# Patient Record
Sex: Male | Born: 1945 | Race: White | Hispanic: No | Marital: Married | Smoking: Former smoker
Health system: Southern US, Community
[De-identification: ages and names within clinical notes are randomized; demographics above are authoritative.]

## PROBLEM LIST (undated history)

## (undated) ENCOUNTER — Ambulatory Visit (HOSPITAL_COMMUNITY): Disposition: A | Discharge status: Other Acute Inpatient Hospital | Payer: Medicare Other

## (undated) DIAGNOSIS — E785 Hyperlipidemia, unspecified: Secondary | ICD-10-CM

## (undated) DIAGNOSIS — I739 Peripheral vascular disease, unspecified: Secondary | ICD-10-CM

## (undated) DIAGNOSIS — I10 Essential (primary) hypertension: Secondary | ICD-10-CM

## (undated) DIAGNOSIS — C801 Malignant (primary) neoplasm, unspecified: Secondary | ICD-10-CM

## (undated) DIAGNOSIS — I6529 Occlusion and stenosis of unspecified carotid artery: Secondary | ICD-10-CM

## (undated) DIAGNOSIS — I2510 Atherosclerotic heart disease of native coronary artery without angina pectoris: Secondary | ICD-10-CM

## (undated) DIAGNOSIS — T7840XA Allergy, unspecified, initial encounter: Secondary | ICD-10-CM

## (undated) HISTORY — DX: Malignant (primary) neoplasm, unspecified: C80.1

## (undated) HISTORY — DX: Allergy, unspecified, initial encounter: T78.40XA

## (undated) HISTORY — PX: ESOPHAGOGASTRODUODENOSCOPY: SHX1529

## (undated) HISTORY — DX: Occlusion and stenosis of unspecified carotid artery: I65.29

## (undated) HISTORY — PX: COLONOSCOPY: SHX174

---

## 2009-07-31 HISTORY — PX: ESOPHAGOSCOPY W/ PERCUTANEOUS GASTROSTOMY TUBE PLACEMENT: SUR463

## 2013-11-25 DIAGNOSIS — D126 Benign neoplasm of colon, unspecified: Secondary | ICD-10-CM

## 2013-11-25 HISTORY — DX: Benign neoplasm of colon, unspecified: D12.6

## 2016-10-05 ENCOUNTER — Ambulatory Visit (INDEPENDENT_AMBULATORY_CARE_PROVIDER_SITE_OTHER): Payer: Medicare Other | Admitting: Family Medicine

## 2016-10-05 ENCOUNTER — Encounter: Payer: Self-pay | Admitting: Family Medicine

## 2016-10-05 ENCOUNTER — Ambulatory Visit
Admission: RE | Admit: 2016-10-05 | Discharge: 2016-10-05 | Disposition: A | Discharge status: Home / Self Care | Payer: Medicare Other | Source: Ambulatory Visit | Attending: Family Medicine | Admitting: Family Medicine

## 2016-10-05 DIAGNOSIS — Z85819 Personal history of malignant neoplasm of unspecified site of lip, oral cavity, and pharynx: Secondary | ICD-10-CM | POA: Diagnosis not present

## 2016-10-05 DIAGNOSIS — R739 Hyperglycemia, unspecified: Secondary | ICD-10-CM

## 2016-10-05 DIAGNOSIS — M5136 Other intervertebral disc degeneration, lumbar region: Secondary | ICD-10-CM | POA: Diagnosis not present

## 2016-10-05 DIAGNOSIS — I25119 Atherosclerotic heart disease of native coronary artery with unspecified angina pectoris: Secondary | ICD-10-CM | POA: Insufficient documentation

## 2016-10-05 DIAGNOSIS — M5417 Radiculopathy, lumbosacral region: Secondary | ICD-10-CM

## 2016-10-05 DIAGNOSIS — I25118 Atherosclerotic heart disease of native coronary artery with other forms of angina pectoris: Secondary | ICD-10-CM

## 2016-10-05 DIAGNOSIS — K219 Gastro-esophageal reflux disease without esophagitis: Secondary | ICD-10-CM | POA: Diagnosis not present

## 2016-10-05 DIAGNOSIS — E7800 Pure hypercholesterolemia, unspecified: Secondary | ICD-10-CM | POA: Insufficient documentation

## 2016-10-05 DIAGNOSIS — M5416 Radiculopathy, lumbar region: Secondary | ICD-10-CM | POA: Insufficient documentation

## 2016-10-05 LAB — POCT GLYCOSYLATED HEMOGLOBIN (HGB A1C): HEMOGLOBIN A1C: 5.8

## 2016-10-05 MED ORDER — LOVASTATIN 20 MG PO TABS: 20.0000 mg | ORAL_TABLET | Freq: Every day | ORAL | 3 refills | Status: DC

## 2016-10-05 MED ORDER — GABAPENTIN 100 MG PO CAPS: 100.0000 mg | ORAL_CAPSULE | Freq: Three times a day (TID) | ORAL | 3 refills | Status: DC

## 2016-10-05 MED ORDER — LISINOPRIL 5 MG PO TABS: 5.0000 mg | ORAL_TABLET | Freq: Every day | ORAL | 3 refills | Status: DC

## 2016-10-05 NOTE — Assessment & Plan Note (Signed)
A1C is in non diabetic range.  No follow up needed.

## 2016-10-05 NOTE — Progress Notes (Signed)
   Subjective:    Patient ID: Shane Shelton, male    DOB: 1945-12-27, 71 y.o.   MRN: [id number]  HPI Per intake form HPDP: Colonoscopy 2012, PSA 2017, tetanus 2017, pneumonia vaccine 2017.  Had "complete physical 05/2016 prior to moving to [place].  71 yo male here to establish care.  Issues 1. States he is up to date on HPDP/ 2. 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  Has stable angina - can bring on if he runs three miles.  I am a little unclear why ACE.  Never told CHF, HBP or DM (he thinks he is on due to CAD.) 3. Known GERD on PRN protonix. Controled. 4. 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  Now disease free. 5. Ex smoker.  See history section for complete hx. 6. Hx of hyperglycemia fasting last lab.  Told needs follow up to check for DM.  Does have family hx.   7. Tingling of lateral aspect of left thigh.  Dysasthesia not anesthesia.  No muscle weakness.  Hx of some low back pain after a bad fall.      Review of Systems Stable CP as above.  NO sOB, HA, wt change, bleeding, skin concerns or focal neuro deficits (except thigh tingling.)  Denies changes in bowel, bladder or appetite.     Objective:   Physical Exam  HEENT, WNL Neck no sig nodes Lungs clear Cardiac RRR without m or g Abd benign, no organomegally or tenderness Back, good ROM Ext a bit of hyperesthesia over lateral aspect of left thigh.  Otherwise normal strength and sensation. Neuro WNL        Assessment & Plan:

## 2016-10-05 NOTE — Patient Instructions (Signed)
I will call with lab and x ray results. You should get your prescriptions from mail order. See me in a couple of months - time to get records from [place].

## 2016-10-05 NOTE — Assessment & Plan Note (Signed)
No change, await records.

## 2016-10-05 NOTE — Assessment & Plan Note (Signed)
I am pretty certain that the thigh discomfort is neuropathic.  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  Will start with plain films.  Also, treatment with gabapentin which should treat neuropathic pain regardless of etiology.

## 2016-10-05 NOTE — Assessment & Plan Note (Signed)
Stable on protonix

## 2016-10-05 NOTE — Assessment & Plan Note (Signed)
Will eventually refer to ENT for survellence.  Wait till records.

## 2016-10-05 NOTE — Assessment & Plan Note (Signed)
No change, await records.  Intend cards referral post records.

## 2016-12-14 ENCOUNTER — Encounter: Payer: Self-pay | Admitting: Family Medicine

## 2016-12-14 ENCOUNTER — Ambulatory Visit (INDEPENDENT_AMBULATORY_CARE_PROVIDER_SITE_OTHER): Payer: Medicare Other | Admitting: Family Medicine

## 2016-12-14 DIAGNOSIS — G629 Polyneuropathy, unspecified: Secondary | ICD-10-CM | POA: Diagnosis not present

## 2016-12-14 DIAGNOSIS — Z85828 Personal history of other malignant neoplasm of skin: Secondary | ICD-10-CM | POA: Diagnosis not present

## 2016-12-14 DIAGNOSIS — I25118 Atherosclerotic heart disease of native coronary artery with other forms of angina pectoris: Secondary | ICD-10-CM

## 2016-12-14 DIAGNOSIS — M25571 Pain in right ankle and joints of right foot: Secondary | ICD-10-CM | POA: Diagnosis not present

## 2016-12-14 DIAGNOSIS — R202 Paresthesia of skin: Secondary | ICD-10-CM | POA: Diagnosis not present

## 2016-12-14 DIAGNOSIS — E7800 Pure hypercholesterolemia, unspecified: Secondary | ICD-10-CM | POA: Diagnosis not present

## 2016-12-14 MED ORDER — ISOSORBIDE MONONITRATE ER 30 MG PO TB24: 30.0000 mg | ORAL_TABLET | Freq: Every day | ORAL | 6 refills | Status: DC

## 2016-12-14 MED ORDER — MELOXICAM 7.5 MG PO TABS: 7.5000 mg | ORAL_TABLET | Freq: Every day | ORAL | 0 refills | Status: DC

## 2016-12-14 NOTE — Assessment & Plan Note (Addendum)
Needs derm referral

## 2016-12-14 NOTE — Patient Instructions (Signed)
For your ankle, I sent in meloxicam - a once a day type NSAID.  Use for two weeks to treat acute inflammation. For your numbness - I am checking several blood tests.  I will call with results.  We may need to do more, especially if it worsens.  The more could be a closer look at your leg circulation or it could be an MRI of your spine. For your angina, I put in a cardiology referral.  Also, we will try the long acting nitroglycerin pill to take every morning.

## 2016-12-14 NOTE — Assessment & Plan Note (Signed)
Check for various neuropathies

## 2016-12-14 NOTE — Assessment & Plan Note (Addendum)
I am hesitant to label him as a radiculopathy/compression neuropathy.  Will check for other causes of neuropathy. Worsening with exercise suggests either vascular or neurogenic claudication.  Consider ABI, consider MRI.  Labs do not reveil any metabolic cause of neuropathy.  Discussed with patient.  Will refer to Dr. Valentina Lucks for ABIs

## 2016-12-14 NOTE — Assessment & Plan Note (Signed)
Again pattern stable.  Add imdur and get cards referral.  May need beta blocker.  Does not have compelling reason for ACE.  Watch low blood pressure

## 2016-12-14 NOTE — Assessment & Plan Note (Addendum)
I am concerned with his angina and aortic calcifications that we are inadaquate on his statin dose.  Check direct LDL.  Called patient with markedly high LDL.  He is aghast and puzzled.  He believes that at his last physical in Sept 2017, his total cholesterol was in the 180's and his LDL was in the 60's.  Obviously we need those records and he is vigorously pursuing with his [place] doctors.    Referring to Dr. Valentina Lucks for ABIs.  He can also comment on statin/cholesterol management.

## 2016-12-14 NOTE — Assessment & Plan Note (Signed)
Most likely an acute tendonitis.  Add NSAID to [facility] Rx  Consider SM referral if does not resolve.

## 2016-12-14 NOTE — Progress Notes (Signed)
   Subjective:    Patient ID: Shane Shelton, male    DOB: Dec 15, 1945, 71 y.o.   MRN: [id number]  HPI Multiple issues: 1. CAD with stable angina.  He is a bit frustrated in that he would like to be more active.  On ASA and statin (low potency.) Have not yet received records from docs in [place].  Unclear why not on Beta blocker or nitrate.   2. Had dysasthesias and parasthesia of right thigh.  Neurontin did not help  Again, no outside records.  Now having left leg sx which are actually worse than his right leg problems.  Does give hx of symptoms worsening with activity.  Reviewed LS spine films.  DDD and facet arthropathy mild.  Note significant aortic calcifications. 3. After yoga, had pain in right ankle, posterior to the lateral malleolus.  No trauma or sprain that he knows of.  Has been doing rice.  Injury was two weeks ago. 4. Wants derm referral.  Hx of skin cancer.         Review of Systems     Objective:   Physical Exam note lowish BP Cardiac RRR without m or g Lungs clear Abd benign Ext, numbness not bothering him now.  Normal exam.  Normal DTRs Right ankle pain just post to lateral malleolus.  Achilles tendon OK.  No heal pain.  No bone pain over malleolus. Good pulses both feet.        Assessment & Plan:

## 2016-12-15 LAB — CMP14+EGFR
A/G RATIO: 2 (ref 1.2–2.2)
ALT: 16 IU/L (ref 0–44)
AST: 17 IU/L (ref 0–40)
Albumin: 4.5 g/dL (ref 3.6–4.8)
Alkaline Phosphatase: 44 IU/L (ref 39–117)
BUN/Creatinine Ratio: 21 (ref 10–24)
BUN: 20 mg/dL (ref 8–27)
Bilirubin Total: 0.3 mg/dL (ref 0.0–1.2)
CHLORIDE: 100 mmol/L (ref 96–106)
CO2: 23 mmol/L (ref 18–29)
CREATININE: 0.97 mg/dL (ref 0.76–1.27)
Calcium: 9.3 mg/dL (ref 8.6–10.2)
GFR calc Af Amer: 92 mL/min/{1.73_m2} (ref >59)
GFR calc non Af Amer: 79 mL/min/{1.73_m2} (ref >59)
GLOBULIN, TOTAL: 2.2 g/dL (ref 1.5–4.5)
Glucose: 97 mg/dL (ref 65–99)
POTASSIUM: 4.6 mmol/L (ref 3.5–5.2)
SODIUM: 139 mmol/L (ref 134–144)
Total Protein: 6.7 g/dL (ref 6.0–8.5)

## 2016-12-15 LAB — CBC
Hematocrit: 42.9 % (ref 37.5–51.0)
Hemoglobin: 14.3 g/dL (ref 13.0–17.7)
MCH: 29.9 pg (ref 26.6–33.0)
MCHC: 33.3 g/dL (ref 31.5–35.7)
MCV: 90 fL (ref 79–97)
PLATELETS: 198 10*3/uL (ref 150–379)
RBC: 4.78 x10E6/uL (ref 4.14–5.80)
RDW: 14.2 % (ref 12.3–15.4)
WBC: 4.4 10*3/uL (ref 3.4–10.8)

## 2016-12-15 LAB — LDL CHOLESTEROL, DIRECT: LDL Direct: 153 mg/dL — ABNORMAL HIGH (ref 0–99)

## 2016-12-15 LAB — TSH: TSH: 3.53 u[IU]/mL (ref 0.450–4.500)

## 2016-12-15 LAB — VITAMIN B12: VITAMIN B 12: 458 pg/mL (ref 232–1245)

## 2016-12-28 ENCOUNTER — Encounter: Payer: Self-pay | Admitting: Family Medicine

## 2016-12-28 ENCOUNTER — Encounter: Payer: Self-pay | Admitting: Pharmacist

## 2016-12-28 ENCOUNTER — Ambulatory Visit (INDEPENDENT_AMBULATORY_CARE_PROVIDER_SITE_OTHER): Payer: Medicare Other | Admitting: Pharmacist

## 2016-12-28 DIAGNOSIS — I25118 Atherosclerotic heart disease of native coronary artery with other forms of angina pectoris: Secondary | ICD-10-CM | POA: Diagnosis not present

## 2016-12-28 DIAGNOSIS — G629 Polyneuropathy, unspecified: Secondary | ICD-10-CM | POA: Diagnosis not present

## 2016-12-28 DIAGNOSIS — E7800 Pure hypercholesterolemia, unspecified: Secondary | ICD-10-CM

## 2016-12-28 DIAGNOSIS — R202 Paresthesia of skin: Secondary | ICD-10-CM

## 2016-12-28 MED ORDER — GABAPENTIN 300 MG PO CAPS: 300.0000 mg | ORAL_CAPSULE | Freq: Three times a day (TID) | ORAL | 3 refills | Status: DC

## 2016-12-28 MED ORDER — LOVASTATIN 40 MG PO TABS: 40.0000 mg | ORAL_TABLET | Freq: Every day | ORAL | 3 refills | Status: DC

## 2016-12-28 NOTE — Assessment & Plan Note (Signed)
increased dose of lovastatin to 40mg  daily. Will consider addition of ezetimibe in the future.

## 2016-12-28 NOTE — Progress Notes (Addendum)
    S:    Patient arrives ambulating with assistance in good spirits.    She presents to the clinic for PADABI evaluation.  Patient was referred on 12/14/16.  Patient was last seen by Primary Care Provider on 12/14/16.   Patient reports pain with walking.  Pain is described as tingling, numbness which occurs after 20 minutes of exercise. Denies pain starting while at rest or standing still. Denies pain worsens when walking up hill or in a hurry. Reports pain when walking at an ordinary pace on a level surface. Reports pain resolves on sitting after 5 minutes.  Pain is localized to lateral thighs and patient reports increased frequency of cramping all over. 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   O:  Lower extremity Physical Exam is normal.   Pulses intact; patient is well nourished and well appearing.   ABI overall = 1.31. Right Arm 110 mmHg    Left Arm 122 mmHg Right ankle posterior tibial 160 mmHg     dorsalis pedis 128 mmHg Left ankle posterior tibial 162 mmHg    dorsalis pedis 148 mmHg   A/P:  Normal ABI and low likelihood of PAD based on ABI of 1.31 in a patient with symptoms of leg pain (tingling, burning) when walking.  Educated patient that leg pain could be related to previous neuropathy. increased dose of gabapentin to 300mg  TID following discussion with Dr. Andria Frames.   For HLD: increased dose of lovastatin to 40mg  daily. Will consider addition of ezetimibe in the future.   Results reviewed and written information provided.   F/U Clinic Visit with Dr. Andria Frames at patient's discretion.  Total time in face-to-face counseling 40 minutes.  Patient seen with Jerrye Noble, PharmD Candidate, and Arrie Senate, PharmD PGY-1 Resident and Bennye Alm, PharmD, BCPS, PGY2 Resident.

## 2016-12-28 NOTE — Patient Instructions (Addendum)
We are going to increase your gabapentin to 300mg  up to three times daily. A new prescription has been sent to your pharmacy. This increased dose may make you more drowsy; if this occurs, we will consider a different medication.  We are also going to increase your Lovastatin to 40mg  daily, given your recent LDL reading. You can take 2 of the 20mg  that you have at home, and then a new prescription will be at your pharmacy.  At a future appointment, we will consider adding Zetia which will help decrease your LDL further.

## 2016-12-28 NOTE — Assessment & Plan Note (Signed)
Normal ABI and low likelihood of PAD based on ABI of 1.31 in a patient with symptoms of leg pain (tingling, burning) when walking.

## 2016-12-28 NOTE — Progress Notes (Signed)
Patient ID: Shane Shelton, male   DOB: [birth year], 71 y.o.   MRN: [id number] Reviewed: Agree with Dr. Graylin Shiver documentation and management.

## 2016-12-28 NOTE — Assessment & Plan Note (Addendum)
Educated patient that leg pain could be related to previous neuropathy. increased dose of gabapentin to 300mg  TID following discussion with Dr. Andria Frames.

## 2017-01-19 DIAGNOSIS — L821 Other seborrheic keratosis: Secondary | ICD-10-CM | POA: Diagnosis not present

## 2017-01-19 DIAGNOSIS — D2272 Melanocytic nevi of left lower limb, including hip: Secondary | ICD-10-CM | POA: Diagnosis not present

## 2017-01-19 DIAGNOSIS — D225 Melanocytic nevi of trunk: Secondary | ICD-10-CM | POA: Diagnosis not present

## 2017-01-19 DIAGNOSIS — Z85828 Personal history of other malignant neoplasm of skin: Secondary | ICD-10-CM | POA: Diagnosis not present

## 2017-01-19 DIAGNOSIS — D2271 Melanocytic nevi of right lower limb, including hip: Secondary | ICD-10-CM | POA: Diagnosis not present

## 2017-01-19 DIAGNOSIS — D485 Neoplasm of uncertain behavior of skin: Secondary | ICD-10-CM | POA: Diagnosis not present

## 2017-01-30 ENCOUNTER — Encounter: Payer: Self-pay | Admitting: *Deleted

## 2017-02-05 ENCOUNTER — Other Ambulatory Visit: Payer: Self-pay | Admitting: *Deleted

## 2017-02-05 MED ORDER — PANTOPRAZOLE SODIUM 40 MG PO TBEC: 40.0000 mg | DELAYED_RELEASE_TABLET | Freq: Every day | ORAL | 3 refills | Status: DC | PRN

## 2017-02-12 ENCOUNTER — Encounter: Payer: Self-pay | Admitting: Family Medicine

## 2017-02-12 DIAGNOSIS — Z86010 Personal history of colonic polyps: Secondary | ICD-10-CM | POA: Insufficient documentation

## 2017-02-12 DIAGNOSIS — R918 Other nonspecific abnormal finding of lung field: Secondary | ICD-10-CM | POA: Insufficient documentation

## 2017-02-12 DIAGNOSIS — D126 Benign neoplasm of colon, unspecified: Secondary | ICD-10-CM

## 2017-02-12 DIAGNOSIS — I25118 Atherosclerotic heart disease of native coronary artery with other forms of angina pectoris: Secondary | ICD-10-CM

## 2017-02-12 DIAGNOSIS — D122 Benign neoplasm of ascending colon: Secondary | ICD-10-CM

## 2017-02-12 NOTE — Progress Notes (Signed)
Scanned results of 07/12/15 Echocardiogram. Mild LVH ETT 1 mm downsloping  sT depression inferiorly Holter 24 hour 271 PVCs, no couplets Scan colonoscopy report of 11/25/13 which showed tubular adenoma Scanned lasted EKG for comparison Had pneumovax 05/16/16 and Prevnar 09/08/13 and Tdap3/19/13.  Will ask nurse to enter. 06/03/15 Had ultrasound AAA screen, no aneurysm.  Scan to chart. CT lung screening12/12/16 - Findings 1. Emphysema, 2. Symmetric biapical scarring - stable. 3. Stable pulm nodules.  Rec Cont annual screening.  Entered colon polyp and pulmonary nodules into problem list with overview.  Updated CAD to include latest information.    Still needs hep C screen.

## 2017-02-13 ENCOUNTER — Encounter: Payer: Self-pay | Admitting: *Deleted

## 2017-02-14 ENCOUNTER — Telehealth (HOSPITAL_COMMUNITY): Payer: Self-pay | Admitting: *Deleted

## 2017-02-14 ENCOUNTER — Encounter: Payer: Self-pay | Admitting: Interventional Cardiology

## 2017-02-14 ENCOUNTER — Ambulatory Visit (INDEPENDENT_AMBULATORY_CARE_PROVIDER_SITE_OTHER): Payer: Medicare Other | Admitting: Interventional Cardiology

## 2017-02-14 VITALS — BP 120/78 | HR 63 | Ht 73.0 in | Wt 215.1 lb

## 2017-02-14 DIAGNOSIS — E7800 Pure hypercholesterolemia, unspecified: Secondary | ICD-10-CM

## 2017-02-14 DIAGNOSIS — I739 Peripheral vascular disease, unspecified: Secondary | ICD-10-CM

## 2017-02-14 DIAGNOSIS — I493 Ventricular premature depolarization: Secondary | ICD-10-CM | POA: Insufficient documentation

## 2017-02-14 DIAGNOSIS — I25118 Atherosclerotic heart disease of native coronary artery with other forms of angina pectoris: Secondary | ICD-10-CM | POA: Diagnosis not present

## 2017-02-14 DIAGNOSIS — I517 Cardiomegaly: Secondary | ICD-10-CM | POA: Insufficient documentation

## 2017-02-14 NOTE — Patient Instructions (Signed)
Medication Instructions:  None  Labwork: None  Testing/Procedures: Your physician has requested that you have en exercise stress myoview. For further information please visit [email or web address]. Please follow instruction sheet, as given.  Your physician has requested that you have a lower extremity arterial exercise duplex. During this test, exercise and ultrasound are used to evaluate arterial blood flow in the legs. Allow one hour for this exam. There are no restrictions or special instructions.  Your physician has requested that you have a lower or upper extremity arterial duplex. This test is an ultrasound of the arteries in the legs or arms. It looks at arterial blood flow in the legs and arms. Allow one hour for Lower and Upper Arterial scans. There are no restrictions or special instructions   Follow-Up: Your physician wants you to follow-up in: 1 year with Dr. Tamala Julian.  You will receive a reminder letter in the mail two months in advance. If you don't receive a letter, please call our office to schedule the follow-up appointment.   Any Other Special Instructions Will Be Listed Below (If Applicable).     If you need a refill on your cardiac medications before your next appointment, please call your pharmacy.

## 2017-02-14 NOTE — Telephone Encounter (Signed)
Patient given detailed instructions per Myocardial Perfusion Study Information Sheet for the test on  02/19/17. Patient notified to arrive 15 minutes early and that it is imperative to arrive on time for appointment to keep from having the test rescheduled.  If you need to cancel or reschedule your appointment, please call the office within 24 hours of your appointment. . Patient verbalized understanding. Kirstie Peri

## 2017-02-14 NOTE — Progress Notes (Signed)
Cardiology Office Note    Date:  02/14/2017   ID:  Shane Shelton, Shane Shelton Sep 29, 1945, MRN [id number]  PCP:  Shane Resides, MD  Cardiologist: Shane Grooms, MD   Chief Complaint  Patient presents with  . Coronary Artery Disease  . Advice Only    Exertional leg pain    History of Present Illness:  Shane Shelton is a 71 y.o. male referred by Shane Shelton for consultation concerning coronary artery disease and to establish for longitudinal follow-up.  The patient is 24 and has a history of nonobstructive coronary disease documented by cardiac catheterization 2016. After the catheterization lisinopril was added to his medical regimen. There was no improvement in his symptoms at that time which included exertional dyspnea and chest pressure. Over the past 2 years though symptoms have gotten progressively worse and are beginning to interfere in his lifestyle. He exercises on a regular basis and has noted progressive reduction in exertional tolerance due to chest pain and dyspnea.  Additionally, he has a progressive 4 month history of left thigh and buttocks greater than right numbness and pain with exertion. The discomfort resolves with rest. The leg complaint is worse than the chest complaint with reference to limitations in quality of life.  Past Medical History:  Diagnosis Date  . Cancer [facility])    Throat cancer 2011    No past surgical history on file.  Current Medications: Outpatient Medications Prior to Visit  Medication Sig Dispense Refill  . aspirin EC 81 MG tablet Take 81 mg by mouth daily.    . Coenzyme Q-10 200 MG CAPS Take 200 mg by mouth daily.    . isosorbide mononitrate (IMDUR) 30 MG 24 hr tablet Take 1 tablet (30 mg total) by mouth daily. 30 tablet 6  . lisinopril (PRINIVIL,ZESTRIL) 5 MG tablet Take 1 tablet (5 mg total) by mouth daily. 90 tablet 3  . lovastatin (MEVACOR) 40 MG tablet Take 1 tablet (40 mg total) by mouth at bedtime. 90  tablet 3  . gabapentin (NEURONTIN) 300 MG capsule Take 1 capsule (300 mg total) by mouth 3 (three) times daily. (Patient not taking: Reported on 02/14/2017) 270 capsule 3  . meloxicam (MOBIC) 7.5 MG tablet Take 1 tablet (7.5 mg total) by mouth daily. (Patient not taking: Reported on 02/14/2017) 30 tablet 0  . pantoprazole (PROTONIX) 40 MG tablet Take 1 tablet (40 mg total) by mouth daily as needed. (Patient taking differently: Take 40 mg by mouth daily as needed (GERD). ) 90 tablet 3   No facility-administered medications prior to visit.      Allergies:   Statins   Social History   Social History  . Marital status: Married    Spouse name: N/A  . Number of children: N/A  . Years of education: N/A   Social History Main Topics  . Smoking status: Former Smoker    Packs/day: 1.00    Years: 40.00    Types: Cigarettes    Quit date: 10/05/2009  . Smokeless tobacco: Never Used  . Alcohol use 4.2 oz/week    7 Glasses of wine per week  . Drug use: No  . Sexual activity: Yes     Comment: monagamous   Other Topics Concern  . None   Social History Narrative  . None     Family History:  The patient's family history includes Cancer in his brother and father; Diabetes in his mother; Heart disease in his brother and mother;  Hyperlipidemia in his brother and mother.   ROS:   Please see the history of present illness.    History of throat cancer secondary to cigarette smoking. Status post radiation and chemotherapy in 2011. Retired, otherwise very active, and planning a trip to [place] this year.  All other systems reviewed and are negative.   PHYSICAL EXAM:   VS:  BP 120/78 (BP Location: Right Arm)   Pulse 63   Ht 6\' 1"  (1.854 m)   Wt 215 lb 1.9 oz (97.6 kg)   BMI 28.38 kg/m    GEN: Well nourished, well developed, in no acute distress  HEENT: normal  Neck: no JVD, carotid bruits, or masses Cardiac: RRR; no murmurs, rubs, or gallops,no edema. 2+ bilateral lower extremity and upper  extremity pulses. No bruits are heard.  Respiratory:  clear to auscultation bilaterally, normal work of breathing GI: soft, nontender, nondistended, + BS MS: no deformity or atrophy  Skin: warm and dry, no rash Neuro:  Alert and Oriented x 3, Strength and sensation are intact Psych: euthymic mood, full affect  Wt Readings from Last 3 Encounters:  02/14/17 215 lb 1.9 oz (97.6 kg)  12/28/16 218 lb 3.2 oz (99 kg)  12/14/16 216 lb 9.6 oz (98.2 kg)      Studies/Labs Reviewed:   EKG:  EKG  Normal sinus rhythm  Recent Labs: 12/14/2016: ALT 16; BUN 20; Creatinine, Ser 0.97; Hemoglobin 14.3; Platelets 198; Potassium 4.6; Sodium 139; TSH 3.530   Lipid Panel    Component Value Date/Time   LDLDIRECT 153 (H) 12/14/2016 1351    Additional studies/ records that were reviewed today include:  Summary of information from prior physicians: Summary performed by Dr. Andria Shelton Scanned results of 07/12/15 Echocardiogram. Mild LVH ETT 1 mm downsloping  sT depression inferiorly Holter 24 hour 271 PVCs, no couplets Scan colonoscopy report of 11/25/13 which showed tubular adenoma Scanned lasted EKG for comparison Had pneumovax 05/16/16 and Prevnar 09/08/13 and Tdap3/19/13.  Will ask nurse to enter. 06/03/15 Had ultrasound AAA screen, no aneurysm.  Scan to chart. CT lung screening12/12/16 - Findings 1. Emphysema, 2. Symmetric biapical scarring - stable. 3. Stable pulm nodules.  Rec Cont annual screening.  Entered colon polyp and pulmonary nodules into problem list with overview.  Updated CAD to include latest information.     ASSESSMENT:    1. Coronary artery disease of native artery of native heart with stable angina pectoris ([place])   2. Hypercholesterolemia   3. Left leg claudication (H[facility])      PLAN:  In order of problems listed above:  1. By description, coronary angiography in 2016 demonstrated non-obstructive coronary disease. Symptoms at that time were not manage and have slowly gotten worse  and begun to limit the patient's activity. Classification is class III angina pectoris. Plan stress Myoview to exclude high risk subset given relatively recent angiography demonstrating "nonobstructive disease". 2. Most recent LDL of record is 153. With known clinical background we need LDL near and preferably below 70. He needs a more intense statin. Apparently Mevacor was increased from 20-40 mg after this particular blood test. This will be managed by Dr. Andria Shelton. 3. Stress bilateral lower extremity Doppler and duplex study to exclude iliac/distal aortic obstructive disease causing claudication. The differential for the left greater than right leg numbness and pain with exertion is neurogenic related to spine disease.    Medication Adjustments/Labs and Tests Ordered: Current medicines are reviewed at length with the patient today.  Concerns regarding medicines are  outlined above.  Medication changes, Labs and Tests ordered today are listed in the Patient Instructions below. Patient Instructions  Medication Instructions:  None  Labwork: None  Testing/Procedures: Your physician has requested that you have en exercise stress myoview. For further information please visit [email or web address]. Please follow instruction sheet, as given.  Your physician has requested that you have a lower extremity arterial exercise duplex. During this test, exercise and ultrasound are used to evaluate arterial blood flow in the legs. Allow one hour for this exam. There are no restrictions or special instructions.  Your physician has requested that you have a lower or upper extremity arterial duplex. This test is an ultrasound of the arteries in the legs or arms. It looks at arterial blood flow in the legs and arms. Allow one hour for Lower and Upper Arterial scans. There are no restrictions or special instructions   Follow-Up: Your physician wants you to follow-up in: 1 year with Dr. Tamala Julian.  You will receive a  reminder letter in the mail two months in advance. If you don't receive a letter, please call our office to schedule the follow-up appointment.   Any Other Special Instructions Will Be Listed Below (If Applicable).     If you need a refill on your cardiac medications before your next appointment, please call your pharmacy.      Signed, Shane Grooms, MD  02/14/2017 2:05 PM    [facility] [address], [place], [place]  [address] Phone: [phone]; Fax: [phone]

## 2017-02-19 ENCOUNTER — Encounter: Payer: Self-pay | Admitting: Interventional Cardiology

## 2017-02-19 ENCOUNTER — Ambulatory Visit (HOSPITAL_COMMUNITY): Payer: Medicare Other | Attending: Cardiovascular Disease

## 2017-02-19 DIAGNOSIS — R9439 Abnormal result of other cardiovascular function study: Secondary | ICD-10-CM | POA: Insufficient documentation

## 2017-02-19 DIAGNOSIS — I25118 Atherosclerotic heart disease of native coronary artery with other forms of angina pectoris: Secondary | ICD-10-CM | POA: Diagnosis not present

## 2017-02-19 LAB — MYOCARDIAL PERFUSION IMAGING
CHL CUP NUCLEAR SSS: 3
CHL RATE OF PERCEIVED EXERTION: 19
CSEPED: 10 min
CSEPHR: 87 %
Estimated workload: 11.4 METS
Exercise duration (sec): 30 s
LV dias vol: 84 mL (ref 62–150)
LV sys vol: 25 mL
MPHR: 151 {beats}/min
Peak HR: 131 {beats}/min
RATE: 0.36
Rest HR: 43 {beats}/min
SDS: 0
SRS: 3
TID: 1

## 2017-02-19 MED ORDER — TECHNETIUM TC 99M TETROFOSMIN IV KIT
11.0000 | PACK | Freq: Once | INTRAVENOUS | Status: AC | PRN
  Administered 2017-02-19: 11 via INTRAVENOUS
  Filled 2017-02-19: qty 11

## 2017-02-19 MED ORDER — TECHNETIUM TC 99M TETROFOSMIN IV KIT
32.6000 | PACK | Freq: Once | INTRAVENOUS | Status: AC | PRN
  Administered 2017-02-19: 32.6 via INTRAVENOUS
  Filled 2017-02-19: qty 33

## 2017-02-21 ENCOUNTER — Other Ambulatory Visit: Payer: Self-pay | Admitting: Interventional Cardiology

## 2017-02-21 DIAGNOSIS — I739 Peripheral vascular disease, unspecified: Secondary | ICD-10-CM

## 2017-02-26 ENCOUNTER — Other Ambulatory Visit: Payer: Self-pay | Admitting: *Deleted

## 2017-02-26 DIAGNOSIS — I25118 Atherosclerotic heart disease of native coronary artery with other forms of angina pectoris: Secondary | ICD-10-CM

## 2017-02-26 MED ORDER — METOPROLOL SUCCINATE ER 25 MG PO TB24: 25.0000 mg | ORAL_TABLET | Freq: Every day | ORAL | 3 refills | Status: DC

## 2017-02-26 MED ORDER — ISOSORBIDE MONONITRATE ER 60 MG PO TB24: 60.0000 mg | ORAL_TABLET | Freq: Every day | ORAL | 3 refills | Status: DC

## 2017-02-26 MED ORDER — METOPROLOL SUCCINATE ER 25 MG PO TB24
25.0000 mg | ORAL_TABLET | Freq: Every day | ORAL | 1 refills | Status: DC
Start: 2017-02-26 — End: 2017-02-26

## 2017-02-27 ENCOUNTER — Ambulatory Visit (HOSPITAL_COMMUNITY)
Admission: RE | Admit: 2017-02-27 | Discharge: 2017-02-27 | Disposition: A | Discharge status: Home / Self Care | Payer: Medicare Other | Source: Ambulatory Visit | Attending: Cardiovascular Disease | Admitting: Cardiovascular Disease

## 2017-02-27 DIAGNOSIS — Z87891 Personal history of nicotine dependence: Secondary | ICD-10-CM | POA: Insufficient documentation

## 2017-02-27 DIAGNOSIS — I2510 Atherosclerotic heart disease of native coronary artery without angina pectoris: Secondary | ICD-10-CM | POA: Insufficient documentation

## 2017-02-27 DIAGNOSIS — I739 Peripheral vascular disease, unspecified: Secondary | ICD-10-CM

## 2017-03-08 NOTE — Progress Notes (Addendum)
Cardiology Office Note    Date:  03/09/2017   ID:  Shane, Shelton [birth year], MRN [id number]  PCP:  [name], MD  Cardiologist: Sinclair Grooms, MD   Chief Complaint  Patient presents with  . Coronary Artery Disease  . Leg Pain    History of Present Illness:  Shane Shelton is a 71 y.o. male with 2016 coronary angiogram demonstrating non obstructive disease, abnormal low risk nuclear 01/2017, hyperlipidemia, hypertension,  and bilateral LE claudication with negative doppler 01/2017.  Since medication adjustment, he has not done very much physical activity. He is not being limited by cardiac symptoms. No medication side effects. Still complaining of left greater than right hip and leg pain with standing and walking. It is not precipitated by exercise. He has not needed use any nitroglycerin.  We spent time discussing the findings on his nuclear study.  Past Medical History:  Diagnosis Date  . Cancer [facility])    Throat cancer 2011    No past surgical history on file.  Current Medications: Outpatient Medications Prior to Visit  Medication Sig Dispense Refill  . aspirin EC 81 MG tablet Take 81 mg by mouth daily.    . Coenzyme Q-10 200 MG CAPS Take 200 mg by mouth daily.    . isosorbide mononitrate (IMDUR) 60 MG 24 hr tablet Take 1 tablet (60 mg total) by mouth daily. 90 tablet 3  . lisinopril (PRINIVIL,ZESTRIL) 5 MG tablet Take 1 tablet (5 mg total) by mouth daily. 90 tablet 3  . lovastatin (MEVACOR) 40 MG tablet Take 1 tablet (40 mg total) by mouth at bedtime. 90 tablet 3  . metoprolol succinate (TOPROL-XL) 25 MG 24 hr tablet Take 1 tablet (25 mg total) by mouth daily. 90 tablet 3  . pantoprazole (PROTONIX) 40 MG tablet Take 40 mg by mouth daily as needed (GERD).     No facility-administered medications prior to visit.      Allergies:   Statins   Social History   Social History  . Marital status: Married    Spouse name: N/A  . Number of  children: N/A  . Years of education: N/A   Social History Main Topics  . Smoking status: Former Smoker    Packs/day: 1.00    Years: 40.00    Types: Cigarettes    Quit date: 10/05/2009  . Smokeless tobacco: Never Used  . Alcohol use 4.2 oz/week    7 Glasses of wine per week  . Drug use: No  . Sexual activity: Yes     Comment: monagamous   Other Topics Concern  . None   Social History Narrative  . None     Family History:  The patient's family history includes Cancer in his brother and father; Diabetes in his mother; Heart disease in his brother and mother; Hyperlipidemia in his brother and mother.   ROS:   Please see the history of present illness.    Left leg discomfort greater than right. The discomfort can be present even when not exercising such as standing washing dishes. It goes away when he sits down.  All other systems reviewed and are negative.   PHYSICAL EXAM:   VS:  BP 112/65   Pulse 60   Ht _0  (1.854 m)   Wt 216 lb 6.4 oz (98.2 kg)   BMI 28.55 kg/m    GEN: Well nourished, well developed, in no acute distress  HEENT: normal  Neck: no JVD, carotid  bruits, or masses Cardiac: RRR; no murmurs, rubs, or gallops,no edema  Respiratory:  clear to auscultation bilaterally, normal work of breathing GI: soft, nontender, nondistended, + BS MS: no deformity or atrophy  Skin: warm and dry, no rash Neuro:  Alert and Oriented x 3, Strength and sensation are intact Psych: euthymic mood, full affect  Wt Readings from Last 3 Encounters:  03/09/17 216 lb 6.4 oz (98.2 kg)  02/19/17 215 lb (97.5 kg)  02/14/17 215 lb 1.9 oz (97.6 kg)      Studies/Labs Reviewed:   EKG:  EKG  Not repeated  Recent Labs: 12/14/2016: ALT 16; BUN 20; Creatinine, Ser 0.97; Hemoglobin 14.3; Platelets 198; Potassium 4.6; Sodium 139; TSH 3.530   Lipid Panel    Component Value Date/Time   LDLDIRECT 153 (H) 12/14/2016 1351    Additional studies/ records that were reviewed today include:    Stress Myoview 01/2017: Study Highlights     Nuclear stress EF: 70%.  Blood pressure demonstrated a normal response to exercise.  No T wave inversion was noted during stress.  There was no ST segment deviation noted during stress.  Defect 1: There is a small defect of mild severity.  This is a low risk study.   Small size, mild intensity fixed inferior perfusion defect suspicious for artifact. No reversible ischemia. LVEF 70%. This is a low risk study.   LOWER EXTREMITY ULTRASOUND, VASCULAR: 02/27/17 Impressions No evidence of segmental lower extremity arterial disease at rest, bilaterally. Normal ABI's, bilaterally. Normal great toe-brachial indices, bilaterally.  Cardiac catheterization 07/15/2015: 50% stenosis of the proximal LAD; dominant right coronary with 40% stenosis in the mid vessel and 70% stenosis in the mid segment of a postero-lateral branch. Circumflex is small in caliber and widely patent left main is normal.  ASSESSMENT:    1. Coronary artery disease of native artery of native heart with stable angina pectoris ([place])   2. Premature ventricular contractions   3. Hypercholesterolemia   4. Left leg claudication (HCC)      PLAN:  In order of problems listed above:  1. Low risk myocardial perfusion study and nonobstructive coronary disease on cath within the past 18 months implying medical management of exertion related symptoms. If they become more limiting we will consider repeat angiography to rule out progression of disease. Cardiac cath performed at [facility] revealed a myriad postero-lateral branch of the right coronary with 75% stenosis.  2. Not addressed 3. Primary care increase the intensity of the patient's Mevacor. A repeat lipid panel has not been performed. LDL target should be 70. 4. No evidence of PAD on exam. My suspicion is that this is neurogenic.  Clinical follow-up in 4 months. No change in current medical regimen. Encouraged increase  physical activity.    Medication Adjustments/Labs and Tests Ordered: Current medicines are reviewed at length with the patient today.  Concerns regarding medicines are outlined above.  Medication changes, Labs and Tests ordered today are listed in the Patient Instructions below. Patient Instructions  Medication Instructions:  None  Labwork: None   Testing/Procedures: None  Follow-Up: Your physician recommends that you schedule a follow-up appointment in: 4 months with Dr. Tamala Julian.    Any Other Special Instructions Will Be Listed Below (If Applicable).     If you need a refill on your cardiac medications before your next appointment, please call your pharmacy.      Signed, Sinclair Grooms, MD  03/09/2017 8:56 AM    [facility] [place], [place], [place]  [address] Phone: [phone]; Fax: [phone]

## 2017-03-09 ENCOUNTER — Encounter: Payer: Self-pay | Admitting: Interventional Cardiology

## 2017-03-09 ENCOUNTER — Ambulatory Visit (INDEPENDENT_AMBULATORY_CARE_PROVIDER_SITE_OTHER): Payer: Medicare Other | Admitting: Interventional Cardiology

## 2017-03-09 VITALS — BP 112/65 | HR 60 | Ht 73.0 in | Wt 216.4 lb

## 2017-03-09 DIAGNOSIS — I209 Angina pectoris, unspecified: Secondary | ICD-10-CM | POA: Diagnosis not present

## 2017-03-09 DIAGNOSIS — I493 Ventricular premature depolarization: Secondary | ICD-10-CM | POA: Diagnosis not present

## 2017-03-09 DIAGNOSIS — I739 Peripheral vascular disease, unspecified: Secondary | ICD-10-CM

## 2017-03-09 DIAGNOSIS — I25118 Atherosclerotic heart disease of native coronary artery with other forms of angina pectoris: Secondary | ICD-10-CM | POA: Diagnosis not present

## 2017-03-09 DIAGNOSIS — E7800 Pure hypercholesterolemia, unspecified: Secondary | ICD-10-CM

## 2017-03-09 NOTE — Patient Instructions (Addendum)
Medication Instructions:  None  Labwork: None   Testing/Procedures: None  Follow-Up: Your physician recommends that you schedule a follow-up appointment in: 4 months with Dr. Tamala Julian.    Any Other Special Instructions Will Be Listed Below (If Applicable).     If you need a refill on your cardiac medications before your next appointment, please call your pharmacy.

## 2017-03-14 ENCOUNTER — Encounter: Payer: Self-pay | Admitting: Family Medicine

## 2017-03-26 ENCOUNTER — Encounter: Payer: Self-pay | Admitting: Family Medicine

## 2017-04-04 ENCOUNTER — Ambulatory Visit (INDEPENDENT_AMBULATORY_CARE_PROVIDER_SITE_OTHER): Payer: Medicare Other | Admitting: Family Medicine

## 2017-04-04 ENCOUNTER — Encounter: Payer: Self-pay | Admitting: Family Medicine

## 2017-04-04 DIAGNOSIS — J029 Acute pharyngitis, unspecified: Secondary | ICD-10-CM

## 2017-04-04 DIAGNOSIS — I25118 Atherosclerotic heart disease of native coronary artery with other forms of angina pectoris: Secondary | ICD-10-CM

## 2017-04-04 DIAGNOSIS — K219 Gastro-esophageal reflux disease without esophagitis: Secondary | ICD-10-CM

## 2017-04-04 DIAGNOSIS — Z1159 Encounter for screening for other viral diseases: Secondary | ICD-10-CM | POA: Diagnosis not present

## 2017-04-04 DIAGNOSIS — Z23 Encounter for immunization: Secondary | ICD-10-CM

## 2017-04-04 DIAGNOSIS — R202 Paresthesia of skin: Secondary | ICD-10-CM | POA: Diagnosis not present

## 2017-04-04 DIAGNOSIS — E7800 Pure hypercholesterolemia, unspecified: Secondary | ICD-10-CM | POA: Diagnosis not present

## 2017-04-04 NOTE — Patient Instructions (Addendum)
Please take your lovastatin, metoprolol and lisinopril at night.   I will call with the cholesterol results.   Get the CT scan of your lungs after you get back from [place]. When you want to be entertained, look up spinal stenosis and neurogenic claudication.   Someone should call with the ENT referral.  I doubt it turns out to be anything - but you need set up with an ENT doc.

## 2017-04-05 LAB — LDL CHOLESTEROL, DIRECT: LDL Direct: 100 mg/dL — ABNORMAL HIGH (ref 0–99)

## 2017-04-05 LAB — HEPATITIS C ANTIBODY: Hep C Virus Ab: <0.1 s/co ratio (ref 0.0–0.9)

## 2017-04-05 MED ORDER — ROSUVASTATIN CALCIUM 10 MG PO TABS: 10.0000 mg | ORAL_TABLET | Freq: Every day | ORAL | 3 refills | Status: DC

## 2017-04-05 NOTE — Assessment & Plan Note (Signed)
Nice drop with lovastatin, but still not at goal of less than 70.  Discussed with patient.  Will retry crestor.  If does not tolerate, will go back to lovastatin and add zetia.

## 2017-04-06 ENCOUNTER — Encounter: Payer: Self-pay | Admitting: Family Medicine

## 2017-04-06 NOTE — Assessment & Plan Note (Signed)
Give hx of tongue cancer, will refer to ENT.

## 2017-04-06 NOTE — Progress Notes (Signed)
   Subjective:    Patient ID: Shane Shelton, male    DOB: [birth year], 71 y.o.   MRN: [id number]  HPI Several issues: 1. Needs cholesterol check.  Goal chol. Less than 70 LDL 2. PAD has been ruled out.  His leg (bilateral thigh and buttock) numbness on exertion becomes much more likely neurogenic claudication.  He can live with these symptoms.   3. Sore throat x 4 weeks.  Has sensation of lump in his throat.  Ex smoker quit ~ 8 years ago.  Has hx of cancer of base of tongue.  Because he has a hx of GERD, restarted PPI.  Did not improve his ST. 4. Hx of pulm nodule.  Needs FU CT later this fall.  He has a trip to [place] planned.  We will schedule when he gets back. 5. Needs one time hep c screen.    Review of Systems     Objective:   Physical Exam Throat/oral cavity normal Neck supple without nodes. Lungs clear Cardiac rRR without m or g        Assessment & Plan:

## 2017-04-06 NOTE — Assessment & Plan Note (Signed)
Cont PPI for now.

## 2017-04-06 NOTE — Assessment & Plan Note (Signed)
Likely neurogenic claudication.  Observe for now.

## 2017-04-06 NOTE — Assessment & Plan Note (Signed)
Negative one time screen.

## 2017-04-12 ENCOUNTER — Ambulatory Visit: Payer: Medicare Other | Admitting: Family Medicine

## 2017-04-18 DIAGNOSIS — J342 Deviated nasal septum: Secondary | ICD-10-CM | POA: Diagnosis not present

## 2017-04-18 DIAGNOSIS — Z87891 Personal history of nicotine dependence: Secondary | ICD-10-CM | POA: Diagnosis not present

## 2017-04-18 DIAGNOSIS — Z8709 Personal history of other diseases of the respiratory system: Secondary | ICD-10-CM | POA: Diagnosis not present

## 2017-04-18 DIAGNOSIS — K219 Gastro-esophageal reflux disease without esophagitis: Secondary | ICD-10-CM | POA: Diagnosis not present

## 2017-04-18 DIAGNOSIS — R432 Parageusia: Secondary | ICD-10-CM | POA: Diagnosis not present

## 2017-04-18 DIAGNOSIS — Z85810 Personal history of malignant neoplasm of tongue: Secondary | ICD-10-CM | POA: Diagnosis not present

## 2017-04-18 DIAGNOSIS — Z923 Personal history of irradiation: Secondary | ICD-10-CM | POA: Diagnosis not present

## 2017-04-22 ENCOUNTER — Encounter: Payer: Self-pay | Admitting: Family Medicine

## 2017-04-23 MED ORDER — METOPROLOL SUCCINATE ER 25 MG PO TB24: 12.5000 mg | ORAL_TABLET | Freq: Every day | ORAL | 3 refills | Status: DC

## 2017-05-08 DIAGNOSIS — K219 Gastro-esophageal reflux disease without esophagitis: Secondary | ICD-10-CM | POA: Diagnosis not present

## 2017-05-08 DIAGNOSIS — J9811 Atelectasis: Secondary | ICD-10-CM | POA: Diagnosis not present

## 2017-05-08 DIAGNOSIS — I10 Essential (primary) hypertension: Secondary | ICD-10-CM | POA: Diagnosis not present

## 2017-05-08 DIAGNOSIS — I200 Unstable angina: Secondary | ICD-10-CM | POA: Diagnosis not present

## 2017-05-08 DIAGNOSIS — M48062 Spinal stenosis, lumbar region with neurogenic claudication: Secondary | ICD-10-CM | POA: Diagnosis not present

## 2017-05-08 DIAGNOSIS — I25110 Atherosclerotic heart disease of native coronary artery with unstable angina pectoris: Secondary | ICD-10-CM | POA: Diagnosis not present

## 2017-05-08 DIAGNOSIS — I2510 Atherosclerotic heart disease of native coronary artery without angina pectoris: Secondary | ICD-10-CM | POA: Diagnosis not present

## 2017-05-08 DIAGNOSIS — R079 Chest pain, unspecified: Secondary | ICD-10-CM | POA: Diagnosis not present

## 2017-05-08 DIAGNOSIS — I208 Other forms of angina pectoris: Secondary | ICD-10-CM | POA: Diagnosis not present

## 2017-05-08 DIAGNOSIS — K449 Diaphragmatic hernia without obstruction or gangrene: Secondary | ICD-10-CM | POA: Diagnosis not present

## 2017-05-08 DIAGNOSIS — E785 Hyperlipidemia, unspecified: Secondary | ICD-10-CM | POA: Diagnosis not present

## 2017-05-08 DIAGNOSIS — I249 Acute ischemic heart disease, unspecified: Secondary | ICD-10-CM | POA: Diagnosis not present

## 2017-05-08 HISTORY — PX: CARDIAC CATHETERIZATION: SHX172

## 2017-05-09 ENCOUNTER — Encounter: Payer: Self-pay | Admitting: Family Medicine

## 2017-05-09 ENCOUNTER — Telehealth: Payer: Self-pay | Admitting: Interventional Cardiology

## 2017-05-09 DIAGNOSIS — I200 Unstable angina: Secondary | ICD-10-CM | POA: Diagnosis not present

## 2017-05-09 DIAGNOSIS — I249 Acute ischemic heart disease, unspecified: Secondary | ICD-10-CM | POA: Diagnosis not present

## 2017-05-09 DIAGNOSIS — I2510 Atherosclerotic heart disease of native coronary artery without angina pectoris: Secondary | ICD-10-CM | POA: Diagnosis not present

## 2017-05-09 NOTE — Telephone Encounter (Signed)
Shane Shelton is calling because he had to have two stents placed in an emergency while out of town  .  Please call

## 2017-05-09 NOTE — Telephone Encounter (Signed)
See message from patient.  Cath needed for increasing angina/angina at rest.  Cath showed worsening CAD.  Will forward to cardiologist, Dr. Tamala Julian.

## 2017-05-09 NOTE — Telephone Encounter (Signed)
Called patient back. Patient stated he is still out of town in [place] where he had a heart cath done. Patient stated he has a few changes after his recent stay in the hospital. Patient is now on Crestor 40 mg by mouth daily and Brilinta 90 mg by mouth BID. Made changes to patient's medication list. Patient stated he will be back next Monday and would like to see Dr. Tamala Julian as soon as possible. Patient stated he would need an emergency supply of his imdur and lisinopril to be sent to [place]. Patient stated he will send a Pharmacist, community message with pharmacy information. Will forward to Dr. Thompson Caul nurse.

## 2017-05-10 ENCOUNTER — Other Ambulatory Visit: Payer: Self-pay | Admitting: Interventional Cardiology

## 2017-05-10 ENCOUNTER — Encounter: Payer: Self-pay | Admitting: Interventional Cardiology

## 2017-05-10 DIAGNOSIS — I25118 Atherosclerotic heart disease of native coronary artery with other forms of angina pectoris: Secondary | ICD-10-CM

## 2017-05-10 MED ORDER — ISOSORBIDE MONONITRATE ER 60 MG PO TB24: 60.0000 mg | ORAL_TABLET | Freq: Every day | ORAL | 0 refills | Status: DC

## 2017-05-10 NOTE — Telephone Encounter (Signed)
Spoke with pt and got him scheduled to see Dr. Tamala Julian 10/18.  Pt verbalized understanding and was appreciative for call.

## 2017-05-10 NOTE — Telephone Encounter (Signed)
Pt was sent a 10 day supply of isosorbide mononitrate 60 mg tablet, to [facility] in [place], pt is out of town. Confirmation received.

## 2017-05-17 ENCOUNTER — Encounter: Payer: Self-pay | Admitting: Interventional Cardiology

## 2017-05-17 ENCOUNTER — Ambulatory Visit (INDEPENDENT_AMBULATORY_CARE_PROVIDER_SITE_OTHER): Payer: Medicare Other | Admitting: Interventional Cardiology

## 2017-05-17 VITALS — BP 102/68 | HR 48 | Ht 73.0 in | Wt 218.4 lb

## 2017-05-17 DIAGNOSIS — I10 Essential (primary) hypertension: Secondary | ICD-10-CM

## 2017-05-17 DIAGNOSIS — I493 Ventricular premature depolarization: Secondary | ICD-10-CM

## 2017-05-17 DIAGNOSIS — I209 Angina pectoris, unspecified: Secondary | ICD-10-CM | POA: Diagnosis not present

## 2017-05-17 DIAGNOSIS — I25119 Atherosclerotic heart disease of native coronary artery with unspecified angina pectoris: Secondary | ICD-10-CM | POA: Diagnosis not present

## 2017-05-17 DIAGNOSIS — I517 Cardiomegaly: Secondary | ICD-10-CM

## 2017-05-17 DIAGNOSIS — E7800 Pure hypercholesterolemia, unspecified: Secondary | ICD-10-CM | POA: Diagnosis not present

## 2017-05-17 NOTE — Progress Notes (Signed)
Cardiology Office Note    Date:  05/17/2017   ID:  Shane, Shelton [birth year], MRN [id number]  PCP:  [name], MD  Cardiologist: Sinclair Grooms, MD   Chief Complaint  Patient presents with  . Coronary Artery Disease    History of Present Illness:  Shane Shelton is a 71 y.o. male  with 2016 coronary angiogram demonstrating non obstructive disease, abnormal low risk nuclear 01/2017, hyperlipidemia, hypertension,  and bilateral LE claudication with negative doppler 01/2017. Proximal RCA and RCA PL DES with placement of 4.0 x 23 mid and 2.75 x 18 PL Xience [place] 05/08/17  Since stenting earlier this month, no recurrence of chest discomfort. He had progressive crescendo angina over a 2 week time frame that encompassed a vacation in [place]. Since the stent he has done well. No recurrence of chest discomfort. Did suffer significant hematoma in the right forearm. The right arm catheterization was done via the right ulnar artery as the right radial was occluded from prior catheterization in 2016.   Past Medical History:  Diagnosis Date  . Cancer [facility])    Throat cancer 2011    No past surgical history on file.  Current Medications: Outpatient Medications Prior to Visit  Medication Sig Dispense Refill  . aspirin EC 81 MG tablet Take 81 mg by mouth daily.    . Coenzyme Q-10 200 MG CAPS Take 200 mg by mouth daily.    Marland Kitchen lisinopril (PRINIVIL,ZESTRIL) 5 MG tablet Take 1 tablet (5 mg total) by mouth daily. 90 tablet 3  . metoprolol succinate (TOPROL-XL) 25 MG 24 hr tablet Take 0.5 tablets (12.5 mg total) by mouth daily. 90 tablet 3  . pantoprazole (PROTONIX) 40 MG tablet Take 40 mg by mouth daily as needed (GERD).    Marland Kitchen rosuvastatin (CRESTOR) 40 MG tablet Take 40 mg by mouth daily.    . ticagrelor (BRILINTA) 90 MG TABS tablet Take 90 mg by mouth 2 (two) times daily.    . isosorbide mononitrate (IMDUR) 60 MG 24 hr tablet Take 1 tablet (60 mg total) by mouth daily.  10 tablet 0   No facility-administered medications prior to visit.      Allergies:   Gabapentin and Statins   Social History   Social History  . Marital status: Married    Spouse name: N/A  . Number of children: N/A  . Years of education: N/A   Social History Main Topics  . Smoking status: Former Smoker    Packs/day: 1.00    Years: 40.00    Types: Cigarettes    Quit date: 10/05/2009  . Smokeless tobacco: Never Used  . Alcohol use 4.2 oz/week    7 Glasses of wine per week  . Drug use: No  . Sexual activity: Yes     Comment: monagamous   Other Topics Concern  . None   Social History Narrative  . None     Family History:  The patient's family history includes Cancer in his brother and father; Diabetes in his mother; Heart disease in his brother and mother; Hyperlipidemia in his brother and mother.   ROS:   Please see the history of present illness.    Bruise and soreness in the medial aspect of the right arm from hematoma that developed post PCI  All other systems reviewed and are negative.   PHYSICAL EXAM:   VS:  BP 102/68 (BP Location: Left Arm)   Pulse (!) 48   Ht 6'  1" (1.854 m)   Wt 218 lb 6.4 oz (99.1 kg)   BMI 28.81 kg/m    GEN: Well nourished, well developed, in no acute distress  HEENT: normal  Neck: no JVD, carotid bruits, or masses Cardiac: RRR; no murmurs, rubs, or gallops,no edema. Absent right radial pulse. 2+ right ulnar pulse. Moderate ecchymosis in the medial right forearm.  Respiratory:  clear to auscultation bilaterally, normal work of breathing GI: soft, nontender, nondistended, + BS MS: no deformity or atrophy  Skin: warm and dry, no rash Neuro:  Alert and Oriented x 3, Strength and sensation are intact Psych: euthymic mood, full affect  Wt Readings from Last 3 Encounters:  05/17/17 218 lb 6.4 oz (99.1 kg)  04/04/17 218 lb (98.9 kg)  03/09/17 216 lb 6.4 oz (98.2 kg)      Studies/Labs Reviewed:   EKG:  EKG  Sinus bradycardia at 48  bpm. Otherwise unremarkable.  Recent Labs: 12/14/2016: ALT 16; BUN 20; Creatinine, Ser 0.97; Hemoglobin 14.3; Platelets 198; Potassium 4.6; Sodium 139; TSH 3.530   Lipid Panel    Component Value Date/Time   LDLDIRECT 100 (H) 04/04/2017 1438    Additional studies/ records that were reviewed today include:  Reviewed digital images from [place] in-stent for [place]:    1. Coronary artery disease involving native coronary artery of native heart with angina pectoris ([place])   2. Hypercholesterolemia   3. Essential hypertension   4. Left ventricular hypertrophy   5. Premature ventricular contractions      PLAN:  In order of problems listed above:  1. Two site right coronary DES for crescendo angina performed on 05/08/2017. Significant improvement in symptoms. Plan to discontinue Imdur because of relatively low blood pressure. On return may consider discontinuation of low-dose beta blocker because of bradycardia. Enroll in phase II cardiac rehabilitation. Clinical follow-up in 6-8 weeks. 2. LDL target less than 70. 3. Relatively low blood pressure. As mentioned above will see the impact of Imdur discontinuation on blood pressures. Both lisinopril and metoprolol or at very low dose and neither may be required.  Clinical follow-up in 6-8 weeks. Phase II cardiac rehabilitation. DC isosorbide. Call if chest discomfort.      Medication Adjustments/Labs and Tests Ordered: Current medicines are reviewed at length with the patient today.  Concerns regarding medicines are outlined above.  Medication changes, Labs and Tests ordered today are listed in the Patient Instructions below. Patient Instructions  Medication Instructions:  1) DISCONTINUE  Imdur  Labwork: None  Testing/Procedures: None  Follow-Up: Your physician recommends that you schedule a follow-up appointment in: 6 weeks with Dr. Tamala Julian. (Can have 10A on 11/28 or 11:40A on 12/3.)   Any Other  Special Instructions Will Be Listed Below (If Applicable).  You have been referred to Phase 2 Cardiac Rehab.    If you need a refill on your cardiac medications before your next appointment, please call your pharmacy.      Signed, Sinclair Grooms, MD  05/17/2017 11:36 AM    [facility] [place], [place], [place]  [address] Phone: [phone]; Fax: [phone]

## 2017-05-17 NOTE — Patient Instructions (Addendum)
Medication Instructions:  1) DISCONTINUE  Imdur  Labwork: None  Testing/Procedures: None  Follow-Up: Your physician recommends that you schedule a follow-up appointment in: 6 weeks with Dr. Tamala Julian. (Can have 10A on 11/28 or 11:40A on 12/3.)   Any Other Special Instructions Will Be Listed Below (If Applicable).  You have been referred to Phase 2 Cardiac Rehab.    If you need a refill on your cardiac medications before your next appointment, please call your pharmacy.

## 2017-05-22 ENCOUNTER — Other Ambulatory Visit: Payer: Self-pay | Admitting: *Deleted

## 2017-05-22 DIAGNOSIS — Z955 Presence of coronary angioplasty implant and graft: Secondary | ICD-10-CM

## 2017-05-24 ENCOUNTER — Telehealth (HOSPITAL_COMMUNITY): Payer: Self-pay

## 2017-05-24 NOTE — Telephone Encounter (Signed)
Contacted patient in regards to Cardiac Rehab - He is interested in program. Scheduled orientation on 06/05/2017 at 7:30am. He will be attending the 11:15am exercise class.

## 2017-05-24 NOTE — Telephone Encounter (Signed)
Patients insurance is active and covered through Medicare A & B. No co-payment, deductible amount is $183.00/$183.00 has been met, no out of pocket, 20% co-insurance, and no pre-authorization is required. Passport/reference # [id number]  Patients insurance is active and covered through [employer] - no co-payment, no deductible, no out of pocket amount, no co-insurance, and no pre-authorization is required. Passport/reference [id number]

## 2017-05-28 ENCOUNTER — Encounter: Payer: Self-pay | Admitting: Interventional Cardiology

## 2017-05-29 ENCOUNTER — Encounter: Payer: Self-pay | Admitting: Family Medicine

## 2017-05-30 ENCOUNTER — Telehealth (HOSPITAL_COMMUNITY): Payer: Self-pay | Admitting: Pharmacist

## 2017-05-30 NOTE — Telephone Encounter (Signed)
Cardiac Rehab Medication Review by a Pharmacist  Does the patient  feel that his/her medications are working for him/her?  yes  Has the patient been experiencing any side effects to the medications prescribed?  Not at this time, he feels he is in tune with his body and knows when he needs to get medications adjusted and is comfortable discussing this with his cardiologist.   Does the patient measure his/her own blood pressure or blood glucose at home?  Yes, checks his blood pressure regularly.   Does the patient have any problems obtaining medications due to transportation or finances?   No, does feel like that he would like to explore other options besides the ticagrelor as it is expensive, but he is able to cover the cost of the medication with no problems at this time.   Understanding of regimen: excellent Understanding of indications: excellent Potential of compliance: excellent    Pharmacist comments: Shane Shelton is a 71 y.o.. male who seemed in good health and spirits over the phone this morning. He was able to tell me about all of his medications and did not note any additional changes from the medications listed. He did mention checking his BP yesterday and it was in his normal low 309M systolic. He has not had any episodes of feeling like his blood pressure is too low since his metoprolol succinate dose was cut in half to 12.5mg  daily. He would like to explore the option of not taking the metoprolol as he said he has never really had blood pressure issues in the past. I think overall that he has a good understanding of his medications. He had no further questions for me at this time.    Jalene Mullet, Pharm.D. PGY1 Pharmacy Resident 05/30/2017 11:13 AM Main Pharmacy: [phone]

## 2017-06-05 ENCOUNTER — Encounter (HOSPITAL_COMMUNITY)
Admission: RE | Admit: 2017-06-05 | Discharge: 2017-06-05 | Disposition: A | Discharge status: Home / Self Care | Payer: Medicare Other | Source: Ambulatory Visit | Attending: Interventional Cardiology | Admitting: Interventional Cardiology

## 2017-06-05 ENCOUNTER — Encounter (HOSPITAL_COMMUNITY): Payer: Self-pay

## 2017-06-05 VITALS — BP 108/60 | HR 54 | Ht 73.0 in | Wt 215.4 lb

## 2017-06-05 DIAGNOSIS — Z955 Presence of coronary angioplasty implant and graft: Secondary | ICD-10-CM | POA: Diagnosis not present

## 2017-06-05 HISTORY — DX: Hyperlipidemia, unspecified: E78.5

## 2017-06-05 HISTORY — DX: Essential (primary) hypertension: I10

## 2017-06-05 HISTORY — DX: Atherosclerotic heart disease of native coronary artery without angina pectoris: I25.10

## 2017-06-05 NOTE — Progress Notes (Signed)
Cardiac Individual Treatment Plan  Patient Details  Name: Shane Shelton MRN: [id number] Date of Birth: [birth year] Referring Provider:     [place] from 06/05/2017 in [place]  Referring Provider  Olen Pel, MD.      Initial Encounter Date:    CARDIAC REHAB PHASE II ORIENTATION from 06/05/2017 in [place]  Date  06/05/17  Referring Provider  Daneen Schick III, MD.      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  aspirin EC 81 MG tablet, Take 81 mg by mouth daily., Disp: , Rfl:  .  Coenzyme Q-10 200 MG CAPS, Take 200 mg by mouth daily., Disp: , Rfl:  .  lisinopril (PRINIVIL,ZESTRIL) 5 MG tablet, Take 1 tablet (5 mg total) by mouth daily., Disp: 90 tablet, Rfl: 3 .  metoprolol succinate (TOPROL-XL) 25 MG 24 hr tablet, Take 0.5 tablets (12.5 mg total) by mouth daily., Disp: 90 tablet, Rfl: 3 .  pantoprazole (PROTONIX) 40 MG tablet, Take 40 mg by mouth daily as needed (GERD)., Disp: , Rfl:  .  rosuvastatin (CRESTOR) 40 MG tablet, Take 40 mg by mouth daily., Disp: , Rfl:  .  ticagrelor (BRILINTA) 90 MG TABS tablet, Take 90 mg by mouth 2 (two) times daily., Disp: , Rfl:   Past Medical History: Past Medical History:  Diagnosis Date  . Cancer [facility])    Throat cancer 2011  . Coronary artery disease   . Hyperlipidemia   . Hypertension     Tobacco Use: Social History   Tobacco Use  Smoking Status Former Smoker  . Packs/day: 1.00  . Years: 40.00  . Pack years: 40.00  . Types: Cigarettes  . Last attempt to quit: 10/05/2009  . Years since quitting: 7.6  Smokeless Tobacco Never Used    Labs: Recent Review Scientist, physiological    Labs for ITP Cardiac and Pulmonary Rehab Latest Ref Rng & Units 10/05/2016 12/14/2016 04/04/2017   LDLDIRECT 0 - 99 mg/dL - 153(H) 100(H)   Hemoglobin A1c - 5.8 - -      Capillary Blood Glucose: No results found for: GLUCAP   Exercise Target Goals: Date: 06/05/17  Exercise Program Goal: Individual exercise prescription set with THRR, safety & activity barriers. Participant demonstrates ability to understand and report RPE using BORG scale, to self-measure pulse accurately, and to acknowledge the importance of the exercise prescription.  Exercise Prescription Goal: Starting with aerobic activity 30 plus minutes a day, 3 days per week for initial exercise prescription. Provide home exercise prescription and guidelines that participant acknowledges understanding prior to discharge.  Activity Barriers & Risk Stratification: Activity Barriers & Cardiac Risk Stratification - 06/05/17 1107      Activity Barriers & Cardiac Risk Stratification   Activity Barriers  Arthritis;Other (comment)    Comments  Intermitent Sciatica    Cardiac Risk Stratification  Moderate       6 Minute Walk: 6 Minute Walk    Row Name 06/05/17 0907         6 Minute Walk   Phase  Initial     Distance  2245 feet     Walk Time  6 minutes     # of Rest Breaks  0     MPH  4.25     METS  4.95     RPE  14  VO2 Peak  17.34     Symptoms  Yes (comment)     Comments  Patient c/o slight left arm pain that resolved with rest.     Resting HR  54 bpm     Resting BP  108/60     Resting Oxygen Saturation   98 %     Exercise Oxygen Saturation  during 6 min walk  98 %     Max Ex. HR  109 bpm     Max Ex. BP  160/80     2 Minute Post BP  104/64        Oxygen Initial Assessment:   Oxygen Re-Evaluation:   Oxygen Discharge (Final Oxygen Re-Evaluation):   Initial Exercise Prescription: Initial Exercise Prescription - 06/05/17 0800      Date of Initial Exercise RX and Referring Provider   Date  06/05/17    Referring Provider  Olen Pel, MD.      Treadmill   MPH  3    Grade  1    Minutes  10    METs  3.71      Bike   Level  1.8     Minutes  10    METs  4.49      NuStep   Level  4    SPM  90    Minutes  10    METs  3.5      Prescription Details   Frequency (times per week)  3    Duration  Progress to 30 minutes of continuous aerobic without signs/symptoms of physical distress      Intensity   THRR 40-80% of Max Heartrate  60-121    Ratings of Perceived Exertion  11-13    Perceived Dyspnea  0-4      Progression   Progression  Continue to progress workloads to maintain intensity without signs/symptoms of physical distress.      Resistance Training   Training Prescription  Yes    Weight  4lbs    Reps  10-15       Perform Capillary Blood Glucose checks as needed.  Exercise Prescription Changes:   Exercise Comments:   Exercise Goals and Review: Exercise Goals    Row Name 06/05/17 0826             Exercise Goals   Increase Physical Activity  Yes       Intervention  Provide advice, education, support and counseling about physical activity/exercise needs.;Develop an individualized exercise prescription for aerobic and resistive training based on initial evaluation findings, risk stratification, comorbidities and participant's personal goals.       Expected Outcomes  Achievement of increased cardiorespiratory fitness and enhanced flexibility, muscular endurance and strength shown through measurements of functional capacity and personal statement of participant.       Increase Strength and Stamina  Yes       Intervention  Provide advice, education, support and counseling about physical activity/exercise needs.;Develop an individualized exercise prescription for aerobic and resistive training based on initial evaluation findings, risk stratification, comorbidities and participant's personal goals.       Expected Outcomes  Achievement of increased cardiorespiratory fitness and enhanced flexibility, muscular endurance and strength shown through measurements of functional capacity and personal statement of  participant.       Able to understand and use rate of perceived exertion (RPE) scale  Yes       Intervention  Provide education and explanation on how to use RPE  scale       Expected Outcomes  Short Term: Able to use RPE daily in rehab to express subjective intensity level;Long Term:  Able to use RPE to guide intensity level when exercising independently       Knowledge and understanding of Target Heart Rate Range (THRR)  Yes       Intervention  Provide education and explanation of THRR including how the numbers were predicted and where they are located for reference       Expected Outcomes  Short Term: Able to state/look up THRR;Long Term: Able to use THRR to govern intensity when exercising independently;Short Term: Able to use daily as guideline for intensity in rehab       Able to check pulse independently  Yes       Intervention  Provide education and demonstration on how to check pulse in carotid and radial arteries.;Review the importance of being able to check your own pulse for safety during independent exercise       Expected Outcomes  Long Term: Able to check pulse independently and accurately;Short Term: Able to explain why pulse checking is important during independent exercise       Understanding of Exercise Prescription  Yes       Intervention  Provide education, explanation, and written materials on patient's individual exercise prescription       Expected Outcomes  Short Term: Able to explain program exercise prescription;Long Term: Able to explain home exercise prescription to exercise independently          Exercise Goals Re-Evaluation :    Discharge Exercise Prescription (Final Exercise Prescription Changes):   Nutrition:  Target Goals: Understanding of nutrition guidelines, daily intake of sodium 1500mg , cholesterol 200mg , calories 30% from fat and 7% or less from saturated fats, daily to have 5 or more servings of fruits and vegetables.  Biometrics: Pre Biometrics -  06/05/17 0827      Pre Biometrics   Height  6\' 1"  (1.854 m)    Weight  215 lb 6.2 oz (97.7 kg)    Waist Circumference  42.75 inches    Hip Circumference  41.5 inches    Waist to Hip Ratio  1.03 %    BMI (Calculated)  28.42    Triceps Skinfold  18 mm    % Body Fat  29.4 %    Grip Strength  45.5 kg    Flexibility  14 in    Single Leg Stand  30 seconds        Nutrition Therapy Plan and Nutrition Goals:   Nutrition Discharge: Nutrition Scores:   Nutrition Goals Re-Evaluation:   Nutrition Goals Re-Evaluation:   Nutrition Goals Discharge (Final Nutrition Goals Re-Evaluation):   Psychosocial: Target Goals: Acknowledge presence or absence of significant depression and/or stress, maximize coping skills, provide positive support system. Participant is able to verbalize types and ability to use techniques and skills needed for reducing stress and depression.  Initial Review & Psychosocial Screening: Initial Psych Review & Screening - 06/05/17 0807      Initial Review   Current issues with  None Identified      Family Dynamics   Good Support System?  Yes wife   wife     Barriers   Psychosocial barriers to participate in program  There are no identifiable barriers or psychosocial needs.      Screening Interventions   Interventions  Encouraged to exercise;Provide feedback about the scores to participant  Quality of Life Scores: Quality of Life - 06/05/17 0806      Quality of Life Scores   Health/Function Pre  27.5 %    Socioeconomic Pre  29.69 %    Psych/Spiritual Pre  27.36 %    Family Pre  30 %    GLOBAL Pre  28.33 %       PHQ-9: Recent Review Flowsheet Data    Depression screen [facility] 2/9 04/04/2017 10/05/2016   Decreased Interest 0 0   Down, Depressed, Hopeless 0 0   PHQ - 2 Score 0 0     Interpretation of Total Score  Total Score Depression Severity:  1-4 = Minimal depression, 5-9 = Mild depression, 10-14 = Moderate depression, 15-19 = Moderately  severe depression, 20-27 = Severe depression   Psychosocial Evaluation and Intervention:   Psychosocial Re-Evaluation:   Psychosocial Discharge (Final Psychosocial Re-Evaluation):   Vocational Rehabilitation: Provide vocational rehab assistance to qualifying candidates.   Vocational Rehab Evaluation & Intervention: Vocational Rehab - 06/05/17 0806      Initial Vocational Rehab Evaluation & Intervention   Assessment shows need for Vocational Rehabilitation  No       Education: Education Goals: Education classes will be provided on a weekly basis, covering required topics. Participant will state understanding/return demonstration of topics presented.  Learning Barriers/Preferences: Learning Barriers/Preferences - 06/05/17 1104      Learning Barriers/Preferences   Learning Barriers  None    Learning Preferences  Skilled Demonstration;Pictoral       Education Topics: Count Your Pulse:  -Group instruction provided by verbal instruction, demonstration, patient participation and written materials to support subject.  Instructors address importance of being able to find your pulse and how to count your pulse when at home without a heart monitor.  Patients get hands on experience counting their pulse with staff help and individually.   Heart Attack, Angina, and Risk Factor Modification:  -Group instruction provided by verbal instruction, video, and written materials to support subject.  Instructors address signs and symptoms of angina and heart attacks.    Also discuss risk factors for heart disease and how to make changes to improve heart health risk factors.   Functional Fitness:  -Group instruction provided by verbal instruction, demonstration, patient participation, and written materials to support subject.  Instructors address safety measures for doing things around the house.  Discuss how to get up and down off the floor, how to pick things up properly, how to safely get out  of a chair without assistance, and balance training.   Meditation and Mindfulness:  -Group instruction provided by verbal instruction, patient participation, and written materials to support subject.  Instructor addresses importance of mindfulness and meditation practice to help reduce stress and improve awareness.  Instructor also leads participants through a meditation exercise.    Stretching for Flexibility and Mobility:  -Group instruction provided by verbal instruction, patient participation, and written materials to support subject.  Instructors lead participants through series of stretches that are designed to increase flexibility thus improving mobility.  These stretches are additional exercise for major muscle groups that are typically performed during regular warm up and cool down.   Hands Only CPR:  -Group verbal, video, and participation provides a basic overview of AHA guidelines for community CPR. Role-play of emergencies allow participants the opportunity to practice calling for help and chest compression technique with discussion of AED use.   Hypertension: -Group verbal and written instruction that provides a basic overview of hypertension including  the most recent diagnostic guidelines, risk factor reduction with self-care instructions and medication management.    Nutrition I class: Heart Healthy Eating:  -Group instruction provided by PowerPoint slides, verbal discussion, and written materials to support subject matter. The instructor gives an explanation and review of the Therapeutic Lifestyle Changes diet recommendations, which includes a discussion on lipid goals, dietary fat, sodium, fiber, plant stanol/sterol esters, sugar, and the components of a well-balanced, healthy diet.   Nutrition II class: Lifestyle Skills:  -Group instruction provided by PowerPoint slides, verbal discussion, and written materials to support subject matter. The instructor gives an explanation  and review of label reading, grocery shopping for heart health, heart healthy recipe modifications, and ways to make healthier choices when eating out.   Diabetes Question & Answer:  -Group instruction provided by PowerPoint slides, verbal discussion, and written materials to support subject matter. The instructor gives an explanation and review of diabetes co-morbidities, pre- and post-prandial blood glucose goals, pre-exercise blood glucose goals, signs, symptoms, and treatment of hypoglycemia and hyperglycemia, and foot care basics.   Diabetes Blitz:  -Group instruction provided by PowerPoint slides, verbal discussion, and written materials to support subject matter. The instructor gives an explanation and review of the physiology behind type 1 and type 2 diabetes, diabetes medications and rational behind using different medications, pre- and post-prandial blood glucose recommendations and Hemoglobin A1c goals, diabetes diet, and exercise including blood glucose guidelines for exercising safely.    Portion Distortion:  -Group instruction provided by PowerPoint slides, verbal discussion, written materials, and food models to support subject matter. The instructor gives an explanation of serving size versus portion size, changes in portions sizes over the last 20 years, and what consists of a serving from each food group.   Stress Management:  -Group instruction provided by verbal instruction, video, and written materials to support subject matter.  Instructors review role of stress in heart disease and how to cope with stress positively.     Exercising on Your Own:  -Group instruction provided by verbal instruction, power point, and written materials to support subject.  Instructors discuss benefits of exercise, components of exercise, frequency and intensity of exercise, and end points for exercise.  Also discuss use of nitroglycerin and activating EMS.  Review options of places to exercise  outside of rehab.  Review guidelines for sex with heart disease.   Cardiac Drugs I:  -Group instruction provided by verbal instruction and written materials to support subject.  Instructor reviews cardiac drug classes: antiplatelets, anticoagulants, beta blockers, and statins.  Instructor discusses reasons, side effects, and lifestyle considerations for each drug class.   Cardiac Drugs II:  -Group instruction provided by verbal instruction and written materials to support subject.  Instructor reviews cardiac drug classes: angiotensin converting enzyme inhibitors (ACE-I), angiotensin II receptor blockers (ARBs), nitrates, and calcium channel blockers.  Instructor discusses reasons, side effects, and lifestyle considerations for each drug class.   Anatomy and Physiology of the Circulatory System:  Group verbal and written instruction and models provide basic cardiac anatomy and physiology, with the coronary electrical and arterial systems. Review of: AMI, Angina, Valve disease, Heart Failure, Peripheral Artery Disease, Cardiac Arrhythmia, Pacemakers, and the ICD.   Other Education:  -Group or individual verbal, written, or video instructions that support the educational goals of the cardiac rehab program.   Knowledge Questionnaire Score: Knowledge Questionnaire Score - 06/05/17 0806      Knowledge Questionnaire Score   Pre Score  22/24  Core Components/Risk Factors/Patient Goals at Admission: Personal Goals and Risk Factors at Admission - 06/05/17 1102      Core Components/Risk Factors/Patient Goals on Admission    Weight Management  Yes;Weight Maintenance    Intervention  Weight Management: Develop a combined nutrition and exercise program designed to reach desired caloric intake, while maintaining appropriate intake of nutrient and fiber, sodium and fats, and appropriate energy expenditure required for the weight goal.;Weight Management: Provide education and appropriate  resources to help participant work on and attain dietary goals.;Weight Management/Obesity: Establish reasonable short term and long term weight goals.    Expected Outcomes  Short Term: Continue to assess and modify interventions until short term weight is achieved;Long Term: Adherence to nutrition and physical activity/exercise program aimed toward attainment of established weight goal;Weight Maintenance: Understanding of the daily nutrition guidelines, which includes 25-35% calories from fat, 7% or less cal from saturated fats, less than 200mg  cholesterol, less than 1.5gm of sodium, & 5 or more servings of fruits and vegetables daily;Weight Loss: Understanding of general recommendations for a balanced deficit meal plan, which promotes 1-2 lb weight loss per week and includes a negative energy balance of [phone] kcal/d;Understanding recommendations for meals to include 15-35% energy as protein, 25-35% energy from fat, 35-60% energy from carbohydrates, less than 200mg  of dietary cholesterol, 20-35 gm of total fiber daily;Understanding of distribution of calorie intake throughout the day with the consumption of 4-5 meals/snacks    Hypertension  Yes    Intervention  Provide education on lifestyle modifcations including regular physical activity/exercise, weight management, moderate sodium restriction and increased consumption of fresh fruit, vegetables, and low fat dairy, alcohol moderation, and smoking cessation.;Monitor prescription use compliance.    Expected Outcomes  Short Term: Continued assessment and intervention until BP is < 140/70mm HG in hypertensive participants. < 130/60mm HG in hypertensive participants with diabetes, heart failure or chronic kidney disease.;Long Term: Maintenance of blood pressure at goal levels.    Lipids  Yes    Intervention  Provide education and support for participant on nutrition & aerobic/resistive exercise along with prescribed medications to achieve LDL 70mg , HDL >40mg .     Expected Outcomes  Short Term: Participant states understanding of desired cholesterol values and is compliant with medications prescribed. Participant is following exercise prescription and nutrition guidelines.;Long Term: Cholesterol controlled with medications as prescribed, with individualized exercise RX and with personalized nutrition plan. Value goals: LDL < 70mg , HDL > 40 mg.       Core Components/Risk Factors/Patient Goals Review:    Core Components/Risk Factors/Patient Goals at Discharge (Final Review):    ITP Comments: ITP Comments    Row Name 06/05/17 0741           ITP Comments  Medical Director- Dr. Fransico Him, MD.          Comments: Zackariah attended orientation from 0730 to 0840 to review rules and guidelines for program. Completed 6 minute walk test, Intitial ITP, and exercise prescription.  VSS. Telemetry-Sinus Rhythm.[name], RN,BSN 06/05/2017 11:28 AM

## 2017-06-05 NOTE — Progress Notes (Signed)
Shane Shelton 71 y.o. male DOB Feb 07, 1946 MRN [id number]       Nutrition: Brief Note  1. 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 Cancer [facility])    Throat cancer 2011  . Coronary artery disease   . Hyperlipidemia   . Hypertension    Meds reviewed.  HT: Ht Readings from Last 1 Encounters:  06/05/17 6\' 1"  (1.854 m)    WT: Wt Readings from Last 3 Encounters:  06/05/17 215 lb 6.2 oz (97.7 kg)  05/17/17 218 lb 6.4 oz (99.1 kg)  04/04/17 218 lb (98.9 kg)     BMI 28.42   Current tobacco use? No    Labs:  Lipid Panel     Component Value Date/Time   LDLDIRECT 100 (H) 04/04/2017 1438    Lab Results  Component Value Date   HGBA1C 5.8 10/05/2016   CBG (last 3)  No results for input(s): GLUCAP in the last 72 hours.  Nutrition Diagnosis ? Food-and nutrition-related knowledge deficit related to lack of exposure to information as related to diagnosis of: ? CVD ? Pre-DM ? Overweight related to excessive energy intake as evidenced by a BMI of 28.4  Nutrition Goal(s):  ? Pt to identify food quantities necessary to achieve weight loss of 6-15 lbat graduation from cardiac rehab.   Plan:  Pt to attend nutrition classes ? Nutrition I ? Nutrition II ? Portion Distortion  Will provide client-centered nutrition education as part of interdisciplinary care.   Monitor and evaluate progress toward nutrition goal with team.  Derek Mound, M.Ed, RD, LDN, CDE 06/05/2017 2:44 PM

## 2017-06-11 ENCOUNTER — Encounter (HOSPITAL_COMMUNITY): Admission: RE | Admit: 2017-06-11 | Payer: Medicare Other | Source: Ambulatory Visit

## 2017-06-13 ENCOUNTER — Ambulatory Visit (HOSPITAL_COMMUNITY)
Admission: RE | Admit: 2017-06-13 | Discharge: 2017-06-13 | Disposition: A | Discharge status: Home / Self Care | Payer: Medicare Other | Source: Ambulatory Visit | Attending: Interventional Cardiology | Admitting: Interventional Cardiology

## 2017-06-13 ENCOUNTER — Encounter (HOSPITAL_COMMUNITY)
Admission: RE | Admit: 2017-06-13 | Discharge: 2017-06-13 | Disposition: A | Discharge status: Home / Self Care | Payer: Medicare Other | Source: Ambulatory Visit | Attending: Interventional Cardiology | Admitting: Interventional Cardiology

## 2017-06-13 DIAGNOSIS — Z955 Presence of coronary angioplasty implant and graft: Secondary | ICD-10-CM

## 2017-06-13 NOTE — Progress Notes (Signed)
Incomplete Session Note  Patient Details  Name: Shane Shelton MRN: [id number] Date of Birth: [birth year] Referring Provider:     [place] from 06/05/2017 in [place]  Referring Provider  Olen Pel, MD.      [name] did not complete his rehab session.  [name]  Reported that he experienced some right sided chest discomfort [place]. [name] says that the pain lasted about 2 minutes.  [name] denies having any chest discomfort today, but has right sided chest discomfort anytime he exerts himself during "exercise or any strenuous activity. [name] did not exercise today. [name] PA called and notified.  [name] said to get a 12 lead ECG today and she will review it. 12 lead ECG obtained. No complaints upon exit from cardiac rehab. [name] said that [name] may exercise on Friday. If [name] has any more  Right sided chest discomfort he may need to be seen in the office for evaluation.[name], RN,BSN 06/13/2017 12:52 PM

## 2017-06-15 ENCOUNTER — Encounter (HOSPITAL_COMMUNITY)
Admission: RE | Admit: 2017-06-15 | Discharge: 2017-06-15 | Disposition: A | Discharge status: Home / Self Care | Payer: Medicare Other | Source: Ambulatory Visit | Attending: Interventional Cardiology | Admitting: Interventional Cardiology

## 2017-06-15 DIAGNOSIS — Z955 Presence of coronary angioplasty implant and graft: Secondary | ICD-10-CM

## 2017-06-18 ENCOUNTER — Encounter (HOSPITAL_COMMUNITY)
Admission: RE | Admit: 2017-06-18 | Discharge: 2017-06-18 | Disposition: A | Discharge status: Home / Self Care | Payer: Medicare Other | Source: Ambulatory Visit | Attending: Interventional Cardiology | Admitting: Interventional Cardiology

## 2017-06-18 DIAGNOSIS — Z955 Presence of coronary angioplasty implant and graft: Secondary | ICD-10-CM

## 2017-06-18 NOTE — Progress Notes (Signed)
Daily Session Note  Patient Details  Name: Shane Shelton MRN: [id number] Date of Birth: 05-15-1946 Referring Provider:     [place] from 06/05/2017 in [place]  Referring Provider  Olen Pel, MD.      Encounter Date: 06/15/2017  Check In:   Capillary Blood Glucose: No results found for this or any previous visit (from the past 24 hour(s)).    Social History   Tobacco Use  Smoking Status Former Smoker  . Packs/day: 1.00  . Years: 40.00  . Pack years: 40.00  . Types: Cigarettes  . Last attempt to quit: 10/05/2009  . Years since quitting: 7.7  Smokeless Tobacco Never Used    Goals Met:  Exercise tolerated well Personal goals reviewed No report of cardiac concerns or symptoms  Goals Unmet:  Not Applicable  Comments:  Pt returned to  start cardiac rehab today.  Pt tolerated light exercise without difficulty. Pt denies any chest pain or shortness of breath. Pt VSS, telemetry-SR with no noted ectopy, asymptomatic.  Medication list reconciled. Pt denies barriers to medication compliance.  PSYCHOSOCIAL ASSESSMENT:  PHQ-0. Pt exhibits positive coping skills, hopeful outlook with supportive family. No psychosocial needs identified at this time, no psychosocial interventions necessary. Pt enjoys cooking and readily admits he cooks very well!  Pt oriented to exercise equipment and routine.  Understanding verbalized. Maurice Small RN, BSN Cardiac and Pulmonary Rehab Nurse Navigator      Dr. Fransico Him is Medical Director for Cardiac Rehab at [facility].

## 2017-06-20 ENCOUNTER — Encounter (HOSPITAL_COMMUNITY): Payer: Medicare Other

## 2017-06-25 ENCOUNTER — Encounter (HOSPITAL_COMMUNITY)
Admission: RE | Admit: 2017-06-25 | Discharge: 2017-06-25 | Disposition: A | Discharge status: Home / Self Care | Payer: Medicare Other | Source: Ambulatory Visit | Attending: Interventional Cardiology | Admitting: Interventional Cardiology

## 2017-06-25 DIAGNOSIS — Z955 Presence of coronary angioplasty implant and graft: Secondary | ICD-10-CM | POA: Diagnosis not present

## 2017-06-27 ENCOUNTER — Encounter (HOSPITAL_COMMUNITY)
Admission: RE | Admit: 2017-06-27 | Discharge: 2017-06-27 | Disposition: A | Discharge status: Home / Self Care | Payer: Medicare Other | Source: Ambulatory Visit | Attending: Interventional Cardiology | Admitting: Interventional Cardiology

## 2017-06-27 DIAGNOSIS — Z955 Presence of coronary angioplasty implant and graft: Secondary | ICD-10-CM | POA: Diagnosis not present

## 2017-06-27 NOTE — Progress Notes (Signed)
Reviewed home exercise with pt today.  Pt plans to do yoga, walk and or go to fitness center for exercise.  Reviewed THR, pulse, RPE, sign and symptoms, NTG use, and when to call 911 or MD.  Also discussed weather considerations and indoor options.  Pt voiced understanding.   Wilbon Obenchain [employer]

## 2017-06-29 ENCOUNTER — Encounter (HOSPITAL_COMMUNITY)
Admission: RE | Admit: 2017-06-29 | Discharge: 2017-06-29 | Disposition: A | Discharge status: Home / Self Care | Payer: Medicare Other | Source: Ambulatory Visit | Attending: Interventional Cardiology | Admitting: Interventional Cardiology

## 2017-06-29 DIAGNOSIS — Z955 Presence of coronary angioplasty implant and graft: Secondary | ICD-10-CM

## 2017-06-29 NOTE — Progress Notes (Signed)
Shane Shelton 71 y.o. male DOB June 08, 1946 MRN [id number]       Nutrition  Dx: DES x 2 RCA Note Spoke with pt. Nutrition Plan and Nutrition Survey goals reviewed with pt. Pt is following a Heart Healthy diet. Pt wants to lose wt but is not actively trying to lose wt. Pt states he was unable to be as active as he likes over the past 2 years due to angina. Pt expressed understanding of the information reviewed. Pt aware of nutrition education classes offered. Pt does not plan on attending nutrition classes at this time and declined nutrition class handouts.  Nutrition Diagnosis ? Food-and nutrition-related knowledge deficit related to lack of exposure to information as related to diagnosis of: ? CVD ? Pre-DM ? Overweight related to excessive energy intake as evidenced by a BMI of 28.4  Nutrition Intervention ? Pt's individual nutrition plan reviewed with pt. ? Benefits of adopting Heart Healthy diet discussed when Medficts reviewed.    Nutrition Goal(s):  ? Pt to identify food quantities necessary to achieve weight loss of 6-15 lbat graduation from cardiac rehab.   Plan:  Pt to attend nutrition classes ? Portion Distortion  Will provide client-centered nutrition education as part of interdisciplinary care.   Monitor and evaluate progress toward nutrition goal with team.  Derek Mound, M.Ed, RD, LDN, CDE 06/29/2017 11:46 AM

## 2017-06-29 NOTE — Progress Notes (Signed)
Cardiac Individual Treatment Plan  Patient Details  Name: Shane Shelton MRN: [id number] Date of Birth: 12-16-1945 Referring Provider:     [place] from 06/05/2017 in [place]  Referring Provider  Olen Pel, MD.      Initial Encounter Date:    CARDIAC REHAB PHASE II ORIENTATION from 06/05/2017 in [place]  Date  06/05/17  Referring Provider  Daneen Schick III, MD.      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  aspirin EC 81 MG tablet, Take 81 mg by mouth daily., Disp: , Rfl:  .  Coenzyme Q-10 200 MG CAPS, Take 200 mg by mouth daily., Disp: , Rfl:  .  lisinopril (PRINIVIL,ZESTRIL) 5 MG tablet, Take 1 tablet (5 mg total) by mouth daily., Disp: 90 tablet, Rfl: 3 .  metoprolol succinate (TOPROL-XL) 25 MG 24 hr tablet, Take 0.5 tablets (12.5 mg total) by mouth daily., Disp: 90 tablet, Rfl: 3 .  pantoprazole (PROTONIX) 40 MG tablet, Take 40 mg by mouth daily as needed (GERD)., Disp: , Rfl:  .  rosuvastatin (CRESTOR) 40 MG tablet, Take 40 mg by mouth daily., Disp: , Rfl:  .  ticagrelor (BRILINTA) 90 MG TABS tablet, Take 90 mg by mouth 2 (two) times daily., Disp: , Rfl:   Past Medical History: Past Medical History:  Diagnosis Date  . Cancer [facility])    Throat cancer 2011  . Coronary artery disease   . Hyperlipidemia   . Hypertension     Tobacco Use: Social History   Tobacco Use  Smoking Status Former Smoker  . Packs/day: 1.00  . Years: 40.00  . Pack years: 40.00  . Types: Cigarettes  . Last attempt to quit: 10/05/2009  . Years since quitting: 7.7  Smokeless Tobacco Never Used    Labs: Recent Review Scientist, physiological    Labs for ITP Cardiac and Pulmonary Rehab Latest Ref Rng & Units 10/05/2016 12/14/2016 04/04/2017   LDLDIRECT 0 - 99 mg/dL - 153(H) 100(H)    Hemoglobin A1c - 5.8 - -      Capillary Blood Glucose: No results found for: GLUCAP   Exercise Target Goals:    Exercise Program Goal: Individual exercise prescription set with THRR, safety & activity barriers. Participant demonstrates ability to understand and report RPE using BORG scale, to self-measure pulse accurately, and to acknowledge the importance of the exercise prescription.  Exercise Prescription Goal: Starting with aerobic activity 30 plus minutes a day, 3 days per week for initial exercise prescription. Provide home exercise prescription and guidelines that participant acknowledges understanding prior to discharge.  Activity Barriers & Risk Stratification: Activity Barriers & Cardiac Risk Stratification - 06/05/17 1107      Activity Barriers & Cardiac Risk Stratification   Activity Barriers  Arthritis;Other (comment)    Comments  Intermitent Sciatica    Cardiac Risk Stratification  Moderate       6 Minute Walk: 6 Minute Walk    Row Name 06/05/17 0907         6 Minute Walk   Phase  Initial     Distance  2245 feet     Walk Time  6 minutes     # of Rest Breaks  0     MPH  4.25     METS  4.95     RPE  14  VO2 Peak  17.34     Symptoms  Yes (comment)     Comments  Patient c/o slight left arm pain that resolved with rest.     Resting HR  54 bpm     Resting BP  108/60     Resting Oxygen Saturation   98 %     Exercise Oxygen Saturation  during 6 min walk  98 %     Max Ex. HR  109 bpm     Max Ex. BP  160/80     2 Minute Post BP  104/64        Oxygen Initial Assessment:   Oxygen Re-Evaluation:   Oxygen Discharge (Final Oxygen Re-Evaluation):   Initial Exercise Prescription: Initial Exercise Prescription - 06/05/17 0800      Date of Initial Exercise RX and Referring Provider   Date  06/05/17    Referring Provider  Olen Pel, MD.      Treadmill   MPH  3    Grade  1    Minutes  10    METs  3.71      Bike   Level  1.8    Minutes   10    METs  4.49      NuStep   Level  4    SPM  90    Minutes  10    METs  3.5      Prescription Details   Frequency (times per week)  3    Duration  Progress to 30 minutes of continuous aerobic without signs/symptoms of physical distress      Intensity   THRR 40-80% of Max Heartrate  60-121    Ratings of Perceived Exertion  11-13    Perceived Dyspnea  0-4      Progression   Progression  Continue to progress workloads to maintain intensity without signs/symptoms of physical distress.      Resistance Training   Training Prescription  Yes    Weight  4lbs    Reps  10-15       Perform Capillary Blood Glucose checks as needed.  Exercise Prescription Changes:  Exercise Prescription Changes    Row Name 06/15/17 1601 06/26/17 1600           Response to Exercise   Blood Pressure (Admit)  118/62  110/70      Blood Pressure (Exercise)  158/64  136/80      Blood Pressure (Exit)  100/62  102/68      Heart Rate (Admit)  68 bpm  76 bpm      Heart Rate (Exercise)  120 bpm  121 bpm      Heart Rate (Exit)  72 bpm  76 bpm      Rating of Perceived Exertion (Exercise)  13  13      Symptoms  none  none      Comments  pt oriented to exercise equipment today  -      Duration  Continue with 30 min of aerobic exercise without signs/symptoms of physical distress.  Continue with 30 min of aerobic exercise without signs/symptoms of physical distress.      Intensity  THRR unchanged  THRR unchanged        Progression   Progression  Continue to progress workloads to maintain intensity without signs/symptoms of physical distress.  Continue to progress workloads to maintain intensity without signs/symptoms of physical distress.      Average METs  4.1  4.3        Resistance Training   Training Prescription  Yes  Yes      Weight  5lbs  5lbs      Reps  10-15  10-15      Time  10 Minutes  10 Minutes        Treadmill   MPH  3  3      Grade  1  1      Minutes  15  10      METs  3.71  3.71         Bike   Level  1.8  1.8      Minutes  15  10      METs  4.49  4.49        NuStep   Level  -  4      SPM  -  100      Minutes  -  10      METs  -  4.8         Exercise Comments:  Exercise Comments    Row Name 06/15/17 1603 06/27/17 1452         Exercise Comments  Pt was oriented to exercise equipment and responded well to exercise priogram. Pt will continued to be monitored in which pt will exercise safely and progress as tolerated.  Reviewed HEP on 06/27/17         Exercise Goals and Review:  Exercise Goals    Row Name 06/05/17 0826             Exercise Goals   Increase Physical Activity  Yes       Intervention  Provide advice, education, support and counseling about physical activity/exercise needs.;Develop an individualized exercise prescription for aerobic and resistive training based on initial evaluation findings, risk stratification, comorbidities and participant's personal goals.       Expected Outcomes  Achievement of increased cardiorespiratory fitness and enhanced flexibility, muscular endurance and strength shown through measurements of functional capacity and personal statement of participant.       Increase Strength and Stamina  Yes       Intervention  Provide advice, education, support and counseling about physical activity/exercise needs.;Develop an individualized exercise prescription for aerobic and resistive training based on initial evaluation findings, risk stratification, comorbidities and participant's personal goals.       Expected Outcomes  Achievement of increased cardiorespiratory fitness and enhanced flexibility, muscular endurance and strength shown through measurements of functional capacity and personal statement of participant.       Able to understand and use rate of perceived exertion (RPE) scale  Yes       Intervention  Provide education and explanation on how to use RPE scale       Expected Outcomes  Short Term: Able to use RPE daily  in rehab to express subjective intensity level;Long Term:  Able to use RPE to guide intensity level when exercising independently       Knowledge and understanding of Target Heart Rate Range (THRR)  Yes       Intervention  Provide education and explanation of THRR including how the numbers were predicted and where they are located for reference       Expected Outcomes  Short Term: Able to state/look up THRR;Long Term: Able to use THRR to govern intensity when exercising independently;Short Term: Able to use daily as guideline for intensity in rehab  Able to check pulse independently  Yes       Intervention  Provide education and demonstration on how to check pulse in carotid and radial arteries.;Review the importance of being able to check your own pulse for safety during independent exercise       Expected Outcomes  Long Term: Able to check pulse independently and accurately;Short Term: Able to explain why pulse checking is important during independent exercise       Understanding of Exercise Prescription  Yes       Intervention  Provide education, explanation, and written materials on patient's individual exercise prescription       Expected Outcomes  Short Term: Able to explain program exercise prescription;Long Term: Able to explain home exercise prescription to exercise independently          Exercise Goals Re-Evaluation : Exercise Goals Re-Evaluation    Row Name 06/26/17 1604 06/27/17 1453           Exercise Goal Re-Evaluation   Exercise Goals Review  Increase Physical Activity;Understanding of Exercise Prescription;Increase Strength and Stamina;Able to understand and use rate of perceived exertion (RPE) scale  Increase Physical Activity;Able to understand and use rate of perceived exertion (RPE) scale;Knowledge and understanding of Target Heart Rate Range (THRR);Understanding of Exercise Prescription;Increase Strength and Stamina;Able to check pulse independently      Comments  Pt  has a great understanding of exercise program. Pt demonstrates proper use of RPE scale.  Reviewed home exercise with pt today.  Pt plans to do yoga, walk and or go to fitness center for exercise.  Reviewed THR, pulse, RPE, sign and symptoms, NTG use, and when to call 911 or MD.  Also discussed weather considerations and indoor options.  Pt voiced understanding.      Expected Outcomes  Pt will continue to exercise safely in cardiac rehab and exercise for 30 minutes without difficulty.   Pt will continue to exercise safely in cardiac rehab and exercise for 30 minutes without difficulty.           Discharge Exercise Prescription (Final Exercise Prescription Changes): Exercise Prescription Changes - 06/26/17 1600      Response to Exercise   Blood Pressure (Admit)  110/70    Blood Pressure (Exercise)  136/80    Blood Pressure (Exit)  102/68    Heart Rate (Admit)  76 bpm    Heart Rate (Exercise)  121 bpm    Heart Rate (Exit)  76 bpm    Rating of Perceived Exertion (Exercise)  13    Symptoms  none    Duration  Continue with 30 min of aerobic exercise without signs/symptoms of physical distress.    Intensity  THRR unchanged      Progression   Progression  Continue to progress workloads to maintain intensity without signs/symptoms of physical distress.    Average METs  4.3      Resistance Training   Training Prescription  Yes    Weight  5lbs    Reps  10-15    Time  10 Minutes      Treadmill   MPH  3    Grade  1    Minutes  10    METs  3.71      Bike   Level  1.8    Minutes  10    METs  4.49      NuStep   Level  4    SPM  100    Minutes  10  METs  4.8       Nutrition:  Target Goals: Understanding of nutrition guidelines, daily intake of sodium 1500mg , cholesterol 200mg , calories 30% from fat and 7% or less from saturated fats, daily to have 5 or more servings of fruits and vegetables.  Biometrics: Pre Biometrics - 06/05/17 0827      Pre Biometrics   Height  6\' 1"   (1.854 m)    Weight  215 lb 6.2 oz (97.7 kg)    Waist Circumference  42.75 inches    Hip Circumference  41.5 inches    Waist to Hip Ratio  1.03 %    BMI (Calculated)  28.42    Triceps Skinfold  18 mm    % Body Fat  29.4 %    Grip Strength  45.5 kg    Flexibility  14 in    Single Leg Stand  30 seconds        Nutrition Therapy Plan and Nutrition Goals: Nutrition Therapy & Goals - 06/05/17 1447      Nutrition Therapy   Diet  Therapeutic Lifestyle Changes      Personal Nutrition Goals   Nutrition Goal  Wt loss of 1-2 lb/week to a wt loss goal of 6-15 lb at graduation from [place].       Intervention Plan   Intervention  Prescribe, educate and counsel regarding individualized specific dietary modifications aiming towards targeted core components such as weight, hypertension, lipid management, diabetes, heart failure and other comorbidities.    Expected Outcomes  Short Term Goal: Understand basic principles of dietary content, such as calories, fat, sodium, cholesterol and nutrients.;Long Term Goal: Adherence to prescribed nutrition plan.       Nutrition Discharge: Nutrition Scores: Nutrition Assessments - 06/05/17 1447      MEDFICTS Scores   Pre Score  21       Nutrition Goals Re-Evaluation:   Nutrition Goals Re-Evaluation:   Nutrition Goals Discharge (Final Nutrition Goals Re-Evaluation):   Psychosocial: Target Goals: Acknowledge presence or absence of significant depression and/or stress, maximize coping skills, provide positive support system. Participant is able to verbalize types and ability to use techniques and skills needed for reducing stress and depression.  Initial Review & Psychosocial Screening: Initial Psych Review & Screening - 06/05/17 0807      Initial Review   Current issues with  None Identified      Family Dynamics   Good Support System?  Yes wife      Barriers   Psychosocial barriers to participate in program  There are no identifiable  barriers or psychosocial needs.      Screening Interventions   Interventions  Encouraged to exercise;Provide feedback about the scores to participant       Quality of Life Scores: Quality of Life - 06/05/17 0806      Quality of Life Scores   Health/Function Pre  27.5 %    Socioeconomic Pre  29.69 %    Psych/Spiritual Pre  27.36 %    Family Pre  30 %    GLOBAL Pre  28.33 %       PHQ-9: Recent Review Flowsheet Data    Depression screen [facility] 2/9 06/18/2017 04/04/2017 10/05/2016   Decreased Interest 0 0 0   Down, Depressed, Hopeless 0 0 0   PHQ - 2 Score 0 0 0     Interpretation of Total Score  Total Score Depression Severity:  1-4 = Minimal depression, 5-9 = Mild depression, 10-14 = Moderate  depression, 15-19 = Moderately severe depression, 20-27 = Severe depression   Psychosocial Evaluation and Intervention: Psychosocial Evaluation - 06/18/17 0856      Psychosocial Evaluation & Interventions   Interventions  Relaxation education;Encouraged to exercise with the program and follow exercise prescription;Stress management education    Continue Psychosocial Services   No Follow up required       Psychosocial Re-Evaluation: Psychosocial Re-Evaluation    [place] Name 06/29/17 1358             Psychosocial Re-Evaluation   Current issues with  None Identified       Interventions  Encouraged to attend Cardiac Rehabilitation for the exercise       Continue Psychosocial Services   No Follow up required          Psychosocial Discharge (Final Psychosocial Re-Evaluation): Psychosocial Re-Evaluation - 06/29/17 1358      Psychosocial Re-Evaluation   Current issues with  None Identified    Interventions  Encouraged to attend Cardiac Rehabilitation for the exercise    Continue Psychosocial Services   No Follow up required       Vocational Rehabilitation: Provide vocational rehab assistance to qualifying candidates.   Vocational Rehab Evaluation & Intervention: Vocational Rehab  - 06/05/17 0806      Initial Vocational Rehab Evaluation & Intervention   Assessment shows need for Vocational Rehabilitation  No       Education: Education Goals: Education classes will be provided on a weekly basis, covering required topics. Participant will state understanding/return demonstration of topics presented.  Learning Barriers/Preferences: Learning Barriers/Preferences - 06/05/17 1104      Learning Barriers/Preferences   Learning Barriers  None    Learning Preferences  Skilled Demonstration;Pictoral       Education Topics: Count Your Pulse:  -Group instruction provided by verbal instruction, demonstration, patient participation and written materials to support subject.  Instructors address importance of being able to find your pulse and how to count your pulse when at home without a heart monitor.  Patients get hands on experience counting their pulse with staff help and individually.   Heart Attack, Angina, and Risk Factor Modification:  -Group instruction provided by verbal instruction, video, and written materials to support subject.  Instructors address signs and symptoms of angina and heart attacks.    Also discuss risk factors for heart disease and how to make changes to improve heart health risk factors.   Functional Fitness:  -Group instruction provided by verbal instruction, demonstration, patient participation, and written materials to support subject.  Instructors address safety measures for doing things around the house.  Discuss how to get up and down off the floor, how to pick things up properly, how to safely get out of a chair without assistance, and balance training.   Meditation and Mindfulness:  -Group instruction provided by verbal instruction, patient participation, and written materials to support subject.  Instructor addresses importance of mindfulness and meditation practice to help reduce stress and improve awareness.  Instructor also leads  participants through a meditation exercise.    Stretching for Flexibility and Mobility:  -Group instruction provided by verbal instruction, patient participation, and written materials to support subject.  Instructors lead participants through series of stretches that are designed to increase flexibility thus improving mobility.  These stretches are additional exercise for major muscle groups that are typically performed during regular warm up and cool down.   CARDIAC REHAB PHASE II EXERCISE from 06/29/2017 in [place]  Date  06/29/17  Instruction Review Code  2- meets goals/outcomes      Hands Only CPR:  -Group verbal, video, and participation provides a basic overview of AHA guidelines for community CPR. Role-play of emergencies allow participants the opportunity to practice calling for help and chest compression technique with discussion of AED use.   Hypertension: -Group verbal and written instruction that provides a basic overview of hypertension including the most recent diagnostic guidelines, risk factor reduction with self-care instructions and medication management.    Nutrition I class: Heart Healthy Eating:  -Group instruction provided by PowerPoint slides, verbal discussion, and written materials to support subject matter. The instructor gives an explanation and review of the Therapeutic Lifestyle Changes diet recommendations, which includes a discussion on lipid goals, dietary fat, sodium, fiber, plant stanol/sterol esters, sugar, and the components of a well-balanced, healthy diet.   Nutrition II class: Lifestyle Skills:  -Group instruction provided by PowerPoint slides, verbal discussion, and written materials to support subject matter. The instructor gives an explanation and review of label reading, grocery shopping for heart health, heart healthy recipe modifications, and ways to make healthier choices when eating out.   Diabetes Question  & Answer:  -Group instruction provided by PowerPoint slides, verbal discussion, and written materials to support subject matter. The instructor gives an explanation and review of diabetes co-morbidities, pre- and post-prandial blood glucose goals, pre-exercise blood glucose goals, signs, symptoms, and treatment of hypoglycemia and hyperglycemia, and foot care basics.   Diabetes Blitz:  -Group instruction provided by PowerPoint slides, verbal discussion, and written materials to support subject matter. The instructor gives an explanation and review of the physiology behind type 1 and type 2 diabetes, diabetes medications and rational behind using different medications, pre- and post-prandial blood glucose recommendations and Hemoglobin A1c goals, diabetes diet, and exercise including blood glucose guidelines for exercising safely.    Portion Distortion:  -Group instruction provided by PowerPoint slides, verbal discussion, written materials, and food models to support subject matter. The instructor gives an explanation of serving size versus portion size, changes in portions sizes over the last 20 years, and what consists of a serving from each food group.   Stress Management:  -Group instruction provided by verbal instruction, video, and written materials to support subject matter.  Instructors review role of stress in heart disease and how to cope with stress positively.     Exercising on Your Own:  -Group instruction provided by verbal instruction, power point, and written materials to support subject.  Instructors discuss benefits of exercise, components of exercise, frequency and intensity of exercise, and end points for exercise.  Also discuss use of nitroglycerin and activating EMS.  Review options of places to exercise outside of rehab.  Review guidelines for sex with heart disease.   Cardiac Drugs I:  -Group instruction provided by verbal instruction and written materials to support  subject.  Instructor reviews cardiac drug classes: antiplatelets, anticoagulants, beta blockers, and statins.  Instructor discusses reasons, side effects, and lifestyle considerations for each drug class.   Cardiac Drugs II:  -Group instruction provided by verbal instruction and written materials to support subject.  Instructor reviews cardiac drug classes: angiotensin converting enzyme inhibitors (ACE-I), angiotensin II receptor blockers (ARBs), nitrates, and calcium channel blockers.  Instructor discusses reasons, side effects, and lifestyle considerations for each drug class.   Anatomy and Physiology of the Circulatory System:  Group verbal and written instruction and models provide basic cardiac anatomy and physiology, with the coronary electrical  and arterial systems. Review of: AMI, Angina, Valve disease, Heart Failure, Peripheral Artery Disease, Cardiac Arrhythmia, Pacemakers, and the ICD.   CARDIAC REHAB PHASE II EXERCISE from 06/29/2017 in [place]  Date  06/27/17  Instruction Review Code  2- meets goals/outcomes      Other Education:  -Group or individual verbal, written, or video instructions that support the educational goals of the cardiac rehab program.   Knowledge Questionnaire Score: Knowledge Questionnaire Score - 06/05/17 0806      Knowledge Questionnaire Score   Pre Score  22/24       Core Components/Risk Factors/Patient Goals at Admission: Personal Goals and Risk Factors at Admission - 06/05/17 1102      Core Components/Risk Factors/Patient Goals on Admission    Weight Management  Yes;Weight Maintenance    Intervention  Weight Management: Develop a combined nutrition and exercise program designed to reach desired caloric intake, while maintaining appropriate intake of nutrient and fiber, sodium and fats, and appropriate energy expenditure required for the weight goal.;Weight Management: Provide education and appropriate resources  to help participant work on and attain dietary goals.;Weight Management/Obesity: Establish reasonable short term and long term weight goals.    Expected Outcomes  Short Term: Continue to assess and modify interventions until short term weight is achieved;Long Term: Adherence to nutrition and physical activity/exercise program aimed toward attainment of established weight goal;Weight Maintenance: Understanding of the daily nutrition guidelines, which includes 25-35% calories from fat, 7% or less cal from saturated fats, less than 200mg  cholesterol, less than 1.5gm of sodium, & 5 or more servings of fruits and vegetables daily;Weight Loss: Understanding of general recommendations for a balanced deficit meal plan, which promotes 1-2 lb weight loss per week and includes a negative energy balance of [phone] kcal/d;Understanding recommendations for meals to include 15-35% energy as protein, 25-35% energy from fat, 35-60% energy from carbohydrates, less than 200mg  of dietary cholesterol, 20-35 gm of total fiber daily;Understanding of distribution of calorie intake throughout the day with the consumption of 4-5 meals/snacks    Hypertension  Yes    Intervention  Provide education on lifestyle modifcations including regular physical activity/exercise, weight management, moderate sodium restriction and increased consumption of fresh fruit, vegetables, and low fat dairy, alcohol moderation, and smoking cessation.;Monitor prescription use compliance.    Expected Outcomes  Short Term: Continued assessment and intervention until BP is < 140/75mm HG in hypertensive participants. < 130/25mm HG in hypertensive participants with diabetes, heart failure or chronic kidney disease.;Long Term: Maintenance of blood pressure at goal levels.    Lipids  Yes    Intervention  Provide education and support for participant on nutrition & aerobic/resistive exercise along with prescribed medications to achieve LDL 70mg , HDL >40mg .     Expected Outcomes  Short Term: Participant states understanding of desired cholesterol values and is compliant with medications prescribed. Participant is following exercise prescription and nutrition guidelines.;Long Term: Cholesterol controlled with medications as prescribed, with individualized exercise RX and with personalized nutrition plan. Value goals: LDL < 70mg , HDL > 40 mg.       Core Components/Risk Factors/Patient Goals Review:  Goals and Risk Factor Review    Row Name 06/29/17 1354             Core Components/Risk Factors/Patient Goals Review   Personal Goals Review  Weight Management/Obesity;Lipids;Hypertension       Review  Shane Shelton's vital's have been stable at cardiac rehab. [name] has not had any reports of Angina during exercise  Expected Outcomes  [name] will continue to take his medications for HTN and Hyperlipidemia. [name] will continue to participate in exercise          Core Components/Risk Factors/Patient Goals at Discharge (Final Review):  Goals and Risk Factor Review - 06/29/17 1354      Core Components/Risk Factors/Patient Goals Review   Personal Goals Review  Weight Management/Obesity;Lipids;Hypertension    Review  Shane Shelton's vital's have been stable at cardiac rehab. [name] has not had any reports of Angina during exercise    Expected Outcomes  [name] will continue to take his medications for HTN and Hyperlipidemia. [name] will continue to participate in exercise       ITP Comments: ITP Comments    Row Name 06/05/17 0741 06/29/17 1400         ITP Comments  Medical Director- Dr. Fransico Him, MD.  30 Day ITP Review, Patient with good partcipation and good attendance in cardiac rehab         Comments: See ITP comments.[name], RN,BSN 06/29/2017 2:01 PM

## 2017-07-02 ENCOUNTER — Encounter (HOSPITAL_COMMUNITY)
Admission: RE | Admit: 2017-07-02 | Discharge: 2017-07-02 | Disposition: A | Discharge status: Home / Self Care | Payer: Medicare Other | Source: Ambulatory Visit | Attending: Interventional Cardiology | Admitting: Interventional Cardiology

## 2017-07-02 DIAGNOSIS — Z955 Presence of coronary angioplasty implant and graft: Secondary | ICD-10-CM | POA: Diagnosis not present

## 2017-07-04 ENCOUNTER — Encounter (HOSPITAL_COMMUNITY)
Admission: RE | Admit: 2017-07-04 | Discharge: 2017-07-04 | Disposition: A | Discharge status: Home / Self Care | Payer: Medicare Other | Source: Ambulatory Visit | Attending: Interventional Cardiology | Admitting: Interventional Cardiology

## 2017-07-04 DIAGNOSIS — Z955 Presence of coronary angioplasty implant and graft: Secondary | ICD-10-CM

## 2017-07-06 ENCOUNTER — Encounter (HOSPITAL_COMMUNITY)
Admission: RE | Admit: 2017-07-06 | Discharge: 2017-07-06 | Disposition: A | Discharge status: Home / Self Care | Payer: Medicare Other | Source: Ambulatory Visit | Attending: Interventional Cardiology | Admitting: Interventional Cardiology

## 2017-07-06 DIAGNOSIS — Z955 Presence of coronary angioplasty implant and graft: Secondary | ICD-10-CM

## 2017-07-09 ENCOUNTER — Encounter (HOSPITAL_COMMUNITY): Payer: Medicare Other

## 2017-07-10 NOTE — Progress Notes (Signed)
Cardiology Office Note    Date:  07/11/2017   ID:  [name], [name] November 23, 1945, MRN [id number]  PCP:  [name], MD  Cardiologist: Sinclair Grooms, MD   Chief Complaint  Patient presents with  . Coronary Artery Disease    CAD    History of Present Illness:  Shane Shelton is a 71 y.o. male with 2016 coronary angiogram demonstrating non obstructive disease, abnormal low risk nuclear 01/2017, hyperlipidemia, hypertension,  and bilateral LE claudication with negative doppler 01/2017. Proximal RCA and RCA PL DES with placement of 4.0 x 23 mid and 2.75 x 18 PL Xience [place] 05/08/17.  Moderate proximal LAD and distal RCA disease noted.  No recurrence of chest discomfort similar to that present in September and October timeframe prior to right coronary stenting.  Does have intermittent dyspnea that related.  He denies orthopnea, PND, nitroglycerin use, and exertion intolerance.  Tolerating the current medical regimen without excessive side effects.    Past Medical History:  Diagnosis Date  . Cancer [facility])    Throat cancer 2011  . Coronary artery disease   . Hyperlipidemia   . Hypertension     Past Surgical History:  Procedure Laterality Date  . CARDIAC CATHETERIZATION  05/08/2017   S/P S/P PCI, DES of Mid RCA at [facility] in [place]    Current Medications: Outpatient Medications Prior to Visit  Medication Sig Dispense Refill  . aspirin EC 81 MG tablet Take 81 mg by mouth daily.    . Coenzyme Q-10 200 MG CAPS Take 200 mg by mouth daily.    Marland Kitchen lisinopril (PRINIVIL,ZESTRIL) 5 MG tablet Take 1 tablet (5 mg total) by mouth daily. 90 tablet 3  . metoprolol succinate (TOPROL-XL) 25 MG 24 hr tablet Take 0.5 tablets (12.5 mg total) by mouth daily. 90 tablet 3  . pantoprazole (PROTONIX) 40 MG tablet Take 40 mg by mouth daily as needed (GERD).    Marland Kitchen rosuvastatin (CRESTOR) 40 MG tablet Take 40 mg by mouth daily.    . ticagrelor (BRILINTA) 90 MG  TABS tablet Take 90 mg by mouth 2 (two) times daily.     No facility-administered medications prior to visit.      Allergies:   Gabapentin and Statins   Social History   Socioeconomic History  . Marital status: Married    Spouse name: Verdis Frederickson  . Number of children: None  . Years of education: None  . Highest education level: Doctorate  Social Needs  . Financial resource strain: None  . Food insecurity - worry: None  . Food insecurity - inability: None  . Transportation needs - medical: None  . Transportation needs - non-medical: None  Occupational History  . Occupation: Adult nurse    Comment: Retired  Tobacco Use  . Smoking status: Former Smoker    Packs/day: 1.00    Years: 40.00    Pack years: 40.00    Types: Cigarettes    Last attempt to quit: 10/05/2009    Years since quitting: 7.7  . Smokeless tobacco: Never Used  Substance and Sexual Activity  . Alcohol use: Yes    Alcohol/week: 4.2 oz    Types: 7 Glasses of wine per week  . Drug use: No  . Sexual activity: Yes    Comment: monagamous  Other Topics Concern  . None  Social History Narrative  . None     Family History:  The patient's family history includes Cancer in his brother  and father; Diabetes in his mother; Heart disease in his brother and mother; Hyperlipidemia in his brother and mother.   ROS:   Please see the history of present illness.    Decreased energy.  Increase daytime sleepiness. All other systems reviewed and are negative.   PHYSICAL EXAM:   VS:  BP (!) 92/58   Pulse (!) 55   Ht 6\' 2"  (1.88 m)   Wt 219 lb 3.2 oz (99.4 kg)   BMI 28.14 kg/m    GEN: Well nourished, well developed, in no acute distress  HEENT: normal  Neck: no JVD, carotid bruits, or masses Cardiac: RRR; no murmurs, rubs, or gallops,no edema  Respiratory:  clear to auscultation bilaterally, normal work of breathing GI: soft, nontender, nondistended, + BS MS: no deformity or atrophy  Skin: warm and dry, no  rash Neuro:  Alert and Oriented x 3, Strength and sensation are intact Psych: euthymic mood, full affect  Wt Readings from Last 3 Encounters:  07/11/17 219 lb 3.2 oz (99.4 kg)  06/05/17 215 lb 6.2 oz (97.7 kg)  05/17/17 218 lb 6.4 oz (99.1 kg)      Studies/Labs Reviewed:   EKG:  EKG  Not repeated.  Recent Labs: 12/14/2016: ALT 16; BUN 20; Creatinine, Ser 0.97; Hemoglobin 14.3; Platelets 198; Potassium 4.6; Sodium 139; TSH 3.530   Lipid Panel    Component Value Date/Time   LDLDIRECT 100 (H) 04/04/2017 1438    Additional studies/ records that were reviewed today include:  Coronary angiography performed October 2018 during stent implantation: (Images personally reviewed 07/11/2017)  Successful mid RCA DES reducing 95% stenosis to 0% (4.0 x 23 [place]) and RCA PLA 95% to 0% (2.75 x 18 [place]).  RCA is dominant.  50-60% segmental distal RCA and 50-60% ostial to proximal LAD disease.  Circumflex is widely patent.  Both LAD and circumflex distributions are small.   ASSESSMENT:    1. Coronary artery disease involving native coronary artery of native heart with angina pectoris ([place])   2. Dyspnea, unspecified type   3. Essential hypertension   4. Left ventricular hypertrophy   5. Premature ventricular contractions   6. Hypercholesterolemia      PLAN:  In order of problems listed above:  1. Clinically stable without angina.  Moderate distal RCA and proximal LAD disease are being treated with aggressive risk factor modification including pressure control 130/85 mmHg or less, LDL less than 70, exercise, low-fat diet, and glycemic surveillance. 2. Possibly Brilinta related.  Current complaints do not appear to be heart failure related.  Unless symptoms worsen we will complete 6 months on Brilinta and then switch to clopidogrel. 3. Excellent current blood pressure control with above target. 4. Not addressed 5. Asymptomatic 6. Target less than 70 LDL.  Liver and lipid panel  obtained today on rosuvastatin 40 mg/day.   Plan clinical follow-up in 6 months.  Aggressive risk factor modification is noted.  Fasting liver and lipid.  Switch to clopidogrel in April.  Personally viewed/reviewed the digital images from his stent procedure and discussed findings with the patient.  Greater than 50% of time was related to developing a long-term treatment plan.   Medication Adjustments/Labs and Tests Ordered: Current medicines are reviewed at length with the patient today.  Concerns regarding medicines are outlined above.  Medication changes, Labs and Tests ordered today are listed in the Patient Instructions below. Patient Instructions  Medication Instructions:  1) After you have completed the next 90 days of Brilinta, we will  CHANGE you to Plavix 75mg  once daily.  Please contact our office when you have a 2 week supply left of Brilinta so we can get the Plavix prescription sent in.   Labwork: Your physician recommends that you return for lab work when you are fasting (liver, lipid)   Testing/Procedures: None  Follow-Up: Your physician wants you to follow-up in: 6 months with Dr. Tamala Julian.  You will receive a reminder letter in the mail two months in advance. If you don't receive a letter, please call our office to schedule the follow-up appointment.   Any Other Special Instructions Will Be Listed Below (If Applicable).     If you need a refill on your cardiac medications before your next appointment, please call your pharmacy.      Signed, Sinclair Grooms, MD  07/11/2017 11:45 AM    [facility] [place], [place], [place]  [address] Phone: [phone]; Fax: [phone]

## 2017-07-11 ENCOUNTER — Encounter: Payer: Self-pay | Admitting: Interventional Cardiology

## 2017-07-11 ENCOUNTER — Encounter (HOSPITAL_COMMUNITY): Payer: Medicare Other

## 2017-07-11 ENCOUNTER — Ambulatory Visit (INDEPENDENT_AMBULATORY_CARE_PROVIDER_SITE_OTHER): Payer: Medicare Other | Admitting: Interventional Cardiology

## 2017-07-11 VITALS — BP 92/58 | HR 55 | Ht 74.0 in | Wt 219.2 lb

## 2017-07-11 DIAGNOSIS — I25119 Atherosclerotic heart disease of native coronary artery with unspecified angina pectoris: Secondary | ICD-10-CM

## 2017-07-11 DIAGNOSIS — I493 Ventricular premature depolarization: Secondary | ICD-10-CM

## 2017-07-11 DIAGNOSIS — I10 Essential (primary) hypertension: Secondary | ICD-10-CM | POA: Diagnosis not present

## 2017-07-11 DIAGNOSIS — I209 Angina pectoris, unspecified: Secondary | ICD-10-CM

## 2017-07-11 DIAGNOSIS — E7800 Pure hypercholesterolemia, unspecified: Secondary | ICD-10-CM

## 2017-07-11 DIAGNOSIS — I517 Cardiomegaly: Secondary | ICD-10-CM

## 2017-07-11 DIAGNOSIS — R0600 Dyspnea, unspecified: Secondary | ICD-10-CM | POA: Diagnosis not present

## 2017-07-11 MED ORDER — TICAGRELOR 90 MG PO TABS: 90.0000 mg | ORAL_TABLET | Freq: Two times a day (BID) | ORAL | 3 refills | Status: DC

## 2017-07-11 MED ORDER — ROSUVASTATIN CALCIUM 40 MG PO TABS: 40.0000 mg | ORAL_TABLET | Freq: Every day | ORAL | 3 refills | Status: DC

## 2017-07-11 NOTE — Patient Instructions (Signed)
Medication Instructions:  1) After you have completed the next 90 days of Brilinta, we will CHANGE you to Plavix 75mg  once daily.  Please contact our office when you have a 2 week supply left of Brilinta so we can get the Plavix prescription sent in.   Labwork: Your physician recommends that you return for lab work when you are fasting (liver, lipid)   Testing/Procedures: None  Follow-Up: Your physician wants you to follow-up in: 6 months with Dr. Tamala Julian.  You will receive a reminder letter in the mail two months in advance. If you don't receive a letter, please call our office to schedule the follow-up appointment.   Any Other Special Instructions Will Be Listed Below (If Applicable).     If you need a refill on your cardiac medications before your next appointment, please call your pharmacy.

## 2017-07-13 ENCOUNTER — Other Ambulatory Visit: Payer: Medicare Other

## 2017-07-13 ENCOUNTER — Encounter (HOSPITAL_COMMUNITY)
Admission: RE | Admit: 2017-07-13 | Discharge: 2017-07-13 | Disposition: A | Discharge status: Home / Self Care | Payer: Medicare Other | Source: Ambulatory Visit | Attending: Interventional Cardiology | Admitting: Interventional Cardiology

## 2017-07-13 DIAGNOSIS — Z955 Presence of coronary angioplasty implant and graft: Secondary | ICD-10-CM

## 2017-07-16 ENCOUNTER — Encounter (HOSPITAL_COMMUNITY)
Admission: RE | Admit: 2017-07-16 | Discharge: 2017-07-16 | Disposition: A | Discharge status: Home / Self Care | Payer: Medicare Other | Source: Ambulatory Visit | Attending: Interventional Cardiology | Admitting: Interventional Cardiology

## 2017-07-16 DIAGNOSIS — Z955 Presence of coronary angioplasty implant and graft: Secondary | ICD-10-CM

## 2017-07-17 ENCOUNTER — Other Ambulatory Visit: Payer: Medicare Other | Admitting: *Deleted

## 2017-07-17 DIAGNOSIS — E7800 Pure hypercholesterolemia, unspecified: Secondary | ICD-10-CM | POA: Diagnosis not present

## 2017-07-17 LAB — HEPATIC FUNCTION PANEL
ALBUMIN: 4.5 g/dL (ref 3.5–4.8)
ALT: 22 IU/L (ref 0–44)
AST: 16 IU/L (ref 0–40)
Alkaline Phosphatase: 42 IU/L (ref 39–117)
Bilirubin Total: 0.4 mg/dL (ref 0.0–1.2)
Bilirubin, Direct: 0.13 mg/dL (ref 0.00–0.40)
Total Protein: 6.2 g/dL (ref 6.0–8.5)

## 2017-07-17 LAB — LIPID PANEL
CHOLESTEROL TOTAL: 137 mg/dL (ref 100–199)
Chol/HDL Ratio: 3 ratio (ref 0.0–5.0)
HDL: 45 mg/dL (ref >39)
LDL CALC: 73 mg/dL (ref 0–99)
TRIGLYCERIDES: 95 mg/dL (ref 0–149)
VLDL CHOLESTEROL CAL: 19 mg/dL (ref 5–40)

## 2017-07-18 ENCOUNTER — Encounter (HOSPITAL_COMMUNITY)
Admission: RE | Admit: 2017-07-18 | Discharge: 2017-07-18 | Disposition: A | Discharge status: Home / Self Care | Payer: Medicare Other | Source: Ambulatory Visit | Attending: Interventional Cardiology | Admitting: Interventional Cardiology

## 2017-07-18 DIAGNOSIS — Z955 Presence of coronary angioplasty implant and graft: Secondary | ICD-10-CM | POA: Diagnosis not present

## 2017-07-20 ENCOUNTER — Encounter (HOSPITAL_COMMUNITY): Payer: Medicare Other

## 2017-07-25 ENCOUNTER — Encounter (HOSPITAL_COMMUNITY): Payer: Medicare Other

## 2017-07-26 ENCOUNTER — Encounter (HOSPITAL_COMMUNITY): Payer: Self-pay | Admitting: *Deleted

## 2017-07-26 DIAGNOSIS — Z955 Presence of coronary angioplasty implant and graft: Secondary | ICD-10-CM

## 2017-07-26 NOTE — Progress Notes (Signed)
Cardiac Individual Treatment Plan  Patient Details  Name: Shane Shelton MRN: [id number] Date of Birth: 1945/08/06 Referring Provider:     [place] from 06/05/2017 in [facility]  Referring Provider  Olen Pel, MD.      Initial Encounter Date:    CARDIAC REHAB PHASE II ORIENTATION from 06/05/2017 in [facility]  Date  06/05/17  Referring Provider  Daneen Schick III, MD.      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  aspirin EC 81 MG tablet, Take 81 mg by mouth daily., Disp: , Rfl:  .  Coenzyme Q-10 200 MG CAPS, Take 200 mg by mouth daily., Disp: , Rfl:  .  lisinopril (PRINIVIL,ZESTRIL) 5 MG tablet, Take 1 tablet (5 mg total) by mouth daily., Disp: 90 tablet, Rfl: 3 .  metoprolol succinate (TOPROL-XL) 25 MG 24 hr tablet, Take 0.5 tablets (12.5 mg total) by mouth daily., Disp: 90 tablet, Rfl: 3 .  pantoprazole (PROTONIX) 40 MG tablet, Take 40 mg by mouth daily as needed (GERD)., Disp: , Rfl:  .  rosuvastatin (CRESTOR) 40 MG tablet, Take 1 tablet (40 mg total) by mouth daily., Disp: 90 tablet, Rfl: 3 .  ticagrelor (BRILINTA) 90 MG TABS tablet, Take 1 tablet (90 mg total) by mouth 2 (two) times daily., Disp: 180 tablet, Rfl: 3  Past Medical History: Past Medical History:  Diagnosis Date  . Cancer [facility])    Throat cancer 2011  . Coronary artery disease   . Hyperlipidemia   . Hypertension     Tobacco Use: Social History   Tobacco Use  Smoking Status Former Smoker  . Packs/day: 1.00  . Years: 40.00  . Pack years: 40.00  . Types: Cigarettes  . Last attempt to quit: 10/05/2009  . Years since quitting: 7.8  Smokeless Tobacco Never Used    Labs: Recent Review Scientist, physiological    Labs for ITP Cardiac and Pulmonary Rehab Latest Ref Rng & Units 10/05/2016 12/14/2016  04/04/2017 07/17/2017   Cholestrol 100 - 199 mg/dL - - - 137   LDLCALC 0 - 99 mg/dL - - - 73   LDLDIRECT 0 - 99 mg/dL - 153(H) 100(H) -   HDL >39 mg/dL - - - 45   Trlycerides 0 - 149 mg/dL - - - 95   Hemoglobin A1c - 5.8 - - -      Capillary Blood Glucose: No results found for: GLUCAP   Exercise Target Goals:    Exercise Program Goal: Individual exercise prescription set with THRR, safety & activity barriers. Participant demonstrates ability to understand and report RPE using BORG scale, to self-measure pulse accurately, and to acknowledge the importance of the exercise prescription.  Exercise Prescription Goal: Starting with aerobic activity 30 plus minutes a day, 3 days per week for initial exercise prescription. Provide home exercise prescription and guidelines that participant acknowledges understanding prior to discharge.  Activity Barriers & Risk Stratification: Activity Barriers & Cardiac Risk Stratification - 06/05/17 1107      Activity Barriers & Cardiac Risk Stratification   Activity Barriers  Arthritis;Other (comment)    Comments  Intermitent Sciatica    Cardiac Risk Stratification  Moderate       6 Minute Walk: 6 Minute Walk    Row Name 06/05/17 0907         6 Minute Walk  Phase  Initial     Distance  2245 feet     Walk Time  6 minutes     # of Rest Breaks  0     MPH  4.25     METS  4.95     RPE  14     VO2 Peak  17.34     Symptoms  Yes (comment)     Comments  Patient c/o slight left arm pain that resolved with rest.     Resting HR  54 bpm     Resting BP  108/60     Resting Oxygen Saturation   98 %     Exercise Oxygen Saturation  during 6 min walk  98 %     Max Ex. HR  109 bpm     Max Ex. BP  160/80     2 Minute Post BP  104/64        Oxygen Initial Assessment:   Oxygen Re-Evaluation:   Oxygen Discharge (Final Oxygen Re-Evaluation):   Initial Exercise Prescription: Initial Exercise Prescription - 06/05/17 0800      Date of Initial  Exercise RX and Referring Provider   Date  06/05/17    Referring Provider  Olen Pel, MD.      Treadmill   MPH  3    Grade  1    Minutes  10    METs  3.71      Bike   Level  1.8    Minutes  10    METs  4.49      NuStep   Level  4    SPM  90    Minutes  10    METs  3.5      Prescription Details   Frequency (times per week)  3    Duration  Progress to 30 minutes of continuous aerobic without signs/symptoms of physical distress      Intensity   THRR 40-80% of Max Heartrate  60-121    Ratings of Perceived Exertion  11-13    Perceived Dyspnea  0-4      Progression   Progression  Continue to progress workloads to maintain intensity without signs/symptoms of physical distress.      Resistance Training   Training Prescription  Yes    Weight  4lbs    Reps  10-15       Perform Capillary Blood Glucose checks as needed.  Exercise Prescription Changes: Exercise Prescription Changes    Row Name 06/15/17 1601 06/26/17 1600 07/06/17 1526 07/16/17 1517       Response to Exercise   Blood Pressure (Admit)  118/62  110/70  116/62  112/80    Blood Pressure (Exercise)  158/64  136/80  156/80  146/82    Blood Pressure (Exit)  100/62  102/68  98/60  110/84    Heart Rate (Admit)  68 bpm  76 bpm  63 bpm  70 bpm    Heart Rate (Exercise)  120 bpm  121 bpm  122 bpm  120 bpm    Heart Rate (Exit)  72 bpm  76 bpm  69 bpm  78 bpm    Rating of Perceived Exertion (Exercise)  13  13  13  14     Symptoms  none  none  none  none    Comments  pt oriented to exercise equipment today  -  -  pt does HIIT    Duration  Continue with 30 min  of aerobic exercise without signs/symptoms of physical distress.  Continue with 30 min of aerobic exercise without signs/symptoms of physical distress.  Continue with 30 min of aerobic exercise without signs/symptoms of physical distress.  Continue with 30 min of aerobic exercise without signs/symptoms of physical distress.    Intensity  THRR unchanged  THRR  unchanged  THRR unchanged  THRR unchanged      Progression   Progression  Continue to progress workloads to maintain intensity without signs/symptoms of physical distress.  Continue to progress workloads to maintain intensity without signs/symptoms of physical distress.  Continue to progress workloads to maintain intensity without signs/symptoms of physical distress.  Continue to progress workloads to maintain intensity without signs/symptoms of physical distress.    Average METs  4.1  4.3  5.4  5.6      Resistance Training   Training Prescription  Yes  Yes  Yes  Yes    Weight  5lbs  5lbs  6lbs  6lbs    Reps  10-15  10-15  10-15  10-15    Time  10 Minutes  10 Minutes  10 Minutes  10 Minutes      Interval Training   Interval Training  -  -  -  Yes    Equipment  -  -  -  Treadmill;Bike;NuStep    Comments  -  -  -  30" on and 2' off      Treadmill   MPH  3  3  3.2  3.3    Grade  1  1  2  10  3.3/2 mod. intensity for 8' and HIIT for 2' @ 3.3/10    Minutes  15  10  10  10     METs  3.71  3.71  4.33  5.2      Bike   Level  1.8  1.8  1.8  2 HIIT @ 5.5 for 2' and 2.0 mod intensity for 8'    Minutes  15  10  10  10     METs  4.49  4.49  4.45  6.2      NuStep   Level  -  4  6  6     SPM  -  100  120  100    Minutes  -  10  10  10     METs  -  4.8  7.4  5.4      Home Exercise Plan   Plans to continue exercise at  -  -  [facility] (comment) fitness center  [facility] (comment) fitness center    Frequency  -  -  Add 3 additional days to program exercise sessions.  Add 3 additional days to program exercise sessions.    Initial Home Exercises Provided  -  -  06/27/17  06/27/17       Exercise Comments: Exercise Comments    Row Name 06/15/17 1603 06/27/17 1452 07/17/17 1528       Exercise Comments  Pt was oriented to exercise equipment and responded well to exercise priogram. Pt will continued to be monitored in which pt will exercise safely and progress as tolerated.   Reviewed HEP on 06/27/17  Reviewed METs and goals. Pt is tolerating Ex Rx very well; will continue to monitor pt's progress and activity levels.         Exercise Goals and Review: Exercise Goals    Row Name 06/05/17 [phone]  Exercise Goals   Increase Physical Activity  Yes       Intervention  Provide advice, education, support and counseling about physical activity/exercise needs.;Develop an individualized exercise prescription for aerobic and resistive training based on initial evaluation findings, risk stratification, comorbidities and participant's personal goals.       Expected Outcomes  Achievement of increased cardiorespiratory fitness and enhanced flexibility, muscular endurance and strength shown through measurements of functional capacity and personal statement of participant.       Increase Strength and Stamina  Yes       Intervention  Provide advice, education, support and counseling about physical activity/exercise needs.;Develop an individualized exercise prescription for aerobic and resistive training based on initial evaluation findings, risk stratification, comorbidities and participant's personal goals.       Expected Outcomes  Achievement of increased cardiorespiratory fitness and enhanced flexibility, muscular endurance and strength shown through measurements of functional capacity and personal statement of participant.       Able to understand and use rate of perceived exertion (RPE) scale  Yes       Intervention  Provide education and explanation on how to use RPE scale       Expected Outcomes  Short Term: Able to use RPE daily in rehab to express subjective intensity level;Long Term:  Able to use RPE to guide intensity level when exercising independently       Knowledge and understanding of Target Heart Rate Range (THRR)  Yes       Intervention  Provide education and explanation of THRR including how the numbers were predicted and where they are located for  reference       Expected Outcomes  Short Term: Able to state/look up THRR;Long Term: Able to use THRR to govern intensity when exercising independently;Short Term: Able to use daily as guideline for intensity in rehab       Able to check pulse independently  Yes       Intervention  Provide education and demonstration on how to check pulse in carotid and radial arteries.;Review the importance of being able to check your own pulse for safety during independent exercise       Expected Outcomes  Long Term: Able to check pulse independently and accurately;Short Term: Able to explain why pulse checking is important during independent exercise       Understanding of Exercise Prescription  Yes       Intervention  Provide education, explanation, and written materials on patient's individual exercise prescription       Expected Outcomes  Short Term: Able to explain program exercise prescription;Long Term: Able to explain home exercise prescription to exercise independently          Exercise Goals Re-Evaluation : Exercise Goals Re-Evaluation    Row Name 06/26/17 1604 06/27/17 1453 07/17/17 1529         Exercise Goal Re-Evaluation   Exercise Goals Review  Increase Physical Activity;Understanding of Exercise Prescription;Increase Strength and Stamina;Able to understand and use rate of perceived exertion (RPE) scale  Increase Physical Activity;Able to understand and use rate of perceived exertion (RPE) scale;Knowledge and understanding of Target Heart Rate Range (THRR);Understanding of Exercise Prescription;Increase Strength and Stamina;Able to check pulse independently  Increase Physical Activity;Able to understand and use rate of perceived exertion (RPE) scale;Knowledge and understanding of Target Heart Rate Range (THRR);Understanding of Exercise Prescription;Increase Strength and Stamina;Able to check pulse independently     Comments  Pt has a great understanding of exercise program. Pt demonstrates  proper  use of RPE scale.  Reviewed home exercise with pt today.  Pt plans to do yoga, walk and or go to fitness center for exercise.  Reviewed THR, pulse, RPE, sign and symptoms, NTG use, and when to call 911 or MD.  Also discussed weather considerations and indoor options.  Pt voiced understanding.  Pt was introduced to HIIT on 07/13/17. Pt responded well to exercise change and enjoys the variety of exercise program     Expected Outcomes  Pt will continue to exercise safely in cardiac rehab and exercise for 30 minutes without difficulty.   Pt will continue to exercise safely in cardiac rehab and exercise for 30 minutes without difficulty.   Pt will continue to exercise safely in cardiac rehab and exercise for 30 minutes without difficulty.          Discharge Exercise Prescription (Final Exercise Prescription Changes): Exercise Prescription Changes - 07/16/17 1517      Response to Exercise   Blood Pressure (Admit)  112/80    Blood Pressure (Exercise)  146/82    Blood Pressure (Exit)  110/84    Heart Rate (Admit)  70 bpm    Heart Rate (Exercise)  120 bpm    Heart Rate (Exit)  78 bpm    Rating of Perceived Exertion (Exercise)  14    Symptoms  none    Comments  pt does HIIT    Duration  Continue with 30 min of aerobic exercise without signs/symptoms of physical distress.    Intensity  THRR unchanged      Progression   Progression  Continue to progress workloads to maintain intensity without signs/symptoms of physical distress.    Average METs  5.6      Resistance Training   Training Prescription  Yes    Weight  6lbs    Reps  10-15    Time  10 Minutes      Interval Training   Interval Training  Yes    Equipment  Treadmill;Bike;NuStep    Comments  30" on and 2' off      Treadmill   MPH  3.3    Grade  10 3.3/2 mod. intensity for 8' and HIIT for 2' @ 3.3/10    Minutes  10    METs  5.2      Bike   Level  2 HIIT @ 5.5 for 2' and 2.0 mod intensity for 8'    Minutes  10    METs  6.2       NuStep   Level  6    SPM  100    Minutes  10    METs  5.4      Home Exercise Plan   Plans to continue exercise at  [employer] (comment) fitness center    Frequency  Add 3 additional days to program exercise sessions.    Initial Home Exercises Provided  06/27/17       Nutrition:  Target Goals: Understanding of nutrition guidelines, daily intake of sodium 1500mg , cholesterol 200mg , calories 30% from fat and 7% or less from saturated fats, daily to have 5 or more servings of fruits and vegetables.  Biometrics: Pre Biometrics - 06/05/17 0827      Pre Biometrics   Height  6\' 1"  (1.854 m)    Weight  215 lb 6.2 oz (97.7 kg)    Waist Circumference  42.75 inches    Hip Circumference  41.5 inches    Waist to Hip  Ratio  1.03 %    BMI (Calculated)  28.42    Triceps Skinfold  18 mm    % Body Fat  29.4 %    Grip Strength  45.5 kg    Flexibility  14 in    Single Leg Stand  30 seconds        Nutrition Therapy Plan and Nutrition Goals: Nutrition Therapy & Goals - 06/05/17 1447      Nutrition Therapy   Diet  Therapeutic Lifestyle Changes      Personal Nutrition Goals   Nutrition Goal  Wt loss of 1-2 lb/week to a wt loss goal of 6-15 lb at graduation from [place].       Intervention Plan   Intervention  Prescribe, educate and counsel regarding individualized specific dietary modifications aiming towards targeted core components such as weight, hypertension, lipid management, diabetes, heart failure and other comorbidities.    Expected Outcomes  Short Term Goal: Understand basic principles of dietary content, such as calories, fat, sodium, cholesterol and nutrients.;Long Term Goal: Adherence to prescribed nutrition plan.       Nutrition Discharge: Nutrition Scores: Nutrition Assessments - 06/05/17 1447      MEDFICTS Scores   Pre Score  21       Nutrition Goals Re-Evaluation:   Nutrition Goals Re-Evaluation:   Nutrition Goals Discharge (Final Nutrition  Goals Re-Evaluation):   Psychosocial: Target Goals: Acknowledge presence or absence of significant depression and/or stress, maximize coping skills, provide positive support system. Participant is able to verbalize types and ability to use techniques and skills needed for reducing stress and depression.  Initial Review & Psychosocial Screening: Initial Psych Review & Screening - 06/05/17 0807      Initial Review   Current issues with  None Identified      Family Dynamics   Good Support System?  Yes wife      Barriers   Psychosocial barriers to participate in program  There are no identifiable barriers or psychosocial needs.      Screening Interventions   Interventions  Encouraged to exercise;Provide feedback about the scores to participant       Quality of Life Scores: Quality of Life - 06/05/17 0806      Quality of Life Scores   Health/Function Pre  27.5 %    Socioeconomic Pre  29.69 %    Psych/Spiritual Pre  27.36 %    Family Pre  30 %    GLOBAL Pre  28.33 %       PHQ-9: Recent Review Flowsheet Data    Depression screen [facility] 2/9 06/18/2017 04/04/2017 10/05/2016   Decreased Interest 0 0 0   Down, Depressed, Hopeless 0 0 0   PHQ - 2 Score 0 0 0     Interpretation of Total Score  Total Score Depression Severity:  1-4 = Minimal depression, 5-9 = Mild depression, 10-14 = Moderate depression, 15-19 = Moderately severe depression, 20-27 = Severe depression   Psychosocial Evaluation and Intervention: Psychosocial Evaluation - 06/18/17 0856      Psychosocial Evaluation & Interventions   Interventions  Relaxation education;Encouraged to exercise with the program and follow exercise prescription;Stress management education    Continue Psychosocial Services   No Follow up required       Psychosocial Re-Evaluation: Psychosocial Re-Evaluation    [place] Name 06/29/17 1358 07/26/17 1407           Psychosocial Re-Evaluation   Current issues with  None Identified  None  Identified  Interventions  Encouraged to attend Cardiac Rehabilitation for the exercise  Encouraged to attend Cardiac Rehabilitation for the exercise      Continue Psychosocial Services   No Follow up required  No Follow up required         Psychosocial Discharge (Final Psychosocial Re-Evaluation): Psychosocial Re-Evaluation - 07/26/17 1407      Psychosocial Re-Evaluation   Current issues with  None Identified    Interventions  Encouraged to attend Cardiac Rehabilitation for the exercise    Continue Psychosocial Services   No Follow up required       Vocational Rehabilitation: Provide vocational rehab assistance to qualifying candidates.   Vocational Rehab Evaluation & Intervention: Vocational Rehab - 06/05/17 0806      Initial Vocational Rehab Evaluation & Intervention   Assessment shows need for Vocational Rehabilitation  No       Education: Education Goals: Education classes will be provided on a weekly basis, covering required topics. Participant will state understanding/return demonstration of topics presented.  Learning Barriers/Preferences: Learning Barriers/Preferences - 06/05/17 1104      Learning Barriers/Preferences   Learning Barriers  None    Learning Preferences  Skilled Demonstration;Pictoral       Education Topics: Count Your Pulse:  -Group instruction provided by verbal instruction, demonstration, patient participation and written materials to support subject.  Instructors address importance of being able to find your pulse and how to count your pulse when at home without a heart monitor.  Patients get hands on experience counting their pulse with staff help and individually.   Heart Attack, Angina, and Risk Factor Modification:  -Group instruction provided by verbal instruction, video, and written materials to support subject.  Instructors address signs and symptoms of angina and heart attacks.    Also discuss risk factors for heart disease and how  to make changes to improve heart health risk factors.   Functional Fitness:  -Group instruction provided by verbal instruction, demonstration, patient participation, and written materials to support subject.  Instructors address safety measures for doing things around the house.  Discuss how to get up and down off the floor, how to pick things up properly, how to safely get out of a chair without assistance, and balance training.   Meditation and Mindfulness:  -Group instruction provided by verbal instruction, patient participation, and written materials to support subject.  Instructor addresses importance of mindfulness and meditation practice to help reduce stress and improve awareness.  Instructor also leads participants through a meditation exercise.    Stretching for Flexibility and Mobility:  -Group instruction provided by verbal instruction, patient participation, and written materials to support subject.  Instructors lead participants through series of stretches that are designed to increase flexibility thus improving mobility.  These stretches are additional exercise for major muscle groups that are typically performed during regular warm up and cool down.   CARDIAC REHAB PHASE II EXERCISE from 07/18/2017 in [facility]  Date  06/29/17  Instruction Review Code  2- meets goals/outcomes      Hands Only CPR:  -Group verbal, video, and participation provides a basic overview of AHA guidelines for community CPR. Role-play of emergencies allow participants the opportunity to practice calling for help and chest compression technique with discussion of AED use.   Hypertension: -Group verbal and written instruction that provides a basic overview of hypertension including the most recent diagnostic guidelines, risk factor reduction with self-care instructions and medication management.    Nutrition I class: Heart  Healthy Eating:  -Group instruction provided  by PowerPoint slides, verbal discussion, and written materials to support subject matter. The instructor gives an explanation and review of the Therapeutic Lifestyle Changes diet recommendations, which includes a discussion on lipid goals, dietary fat, sodium, fiber, plant stanol/sterol esters, sugar, and the components of a well-balanced, healthy diet.   Nutrition II class: Lifestyle Skills:  -Group instruction provided by PowerPoint slides, verbal discussion, and written materials to support subject matter. The instructor gives an explanation and review of label reading, grocery shopping for heart health, heart healthy recipe modifications, and ways to make healthier choices when eating out.   Diabetes Question & Answer:  -Group instruction provided by PowerPoint slides, verbal discussion, and written materials to support subject matter. The instructor gives an explanation and review of diabetes co-morbidities, pre- and post-prandial blood glucose goals, pre-exercise blood glucose goals, signs, symptoms, and treatment of hypoglycemia and hyperglycemia, and foot care basics.   Diabetes Blitz:  -Group instruction provided by PowerPoint slides, verbal discussion, and written materials to support subject matter. The instructor gives an explanation and review of the physiology behind type 1 and type 2 diabetes, diabetes medications and rational behind using different medications, pre- and post-prandial blood glucose recommendations and Hemoglobin A1c goals, diabetes diet, and exercise including blood glucose guidelines for exercising safely.    Portion Distortion:  -Group instruction provided by PowerPoint slides, verbal discussion, written materials, and food models to support subject matter. The instructor gives an explanation of serving size versus portion size, changes in portions sizes over the last 20 years, and what consists of a serving from each food group.   CARDIAC REHAB PHASE II EXERCISE  from 07/18/2017 in [place]  Date  07/18/17  Educator  RD  Instruction Review Code  2- meets goals/outcomes      Stress Management:  -Group instruction provided by verbal instruction, video, and written materials to support subject matter.  Instructors review role of stress in heart disease and how to cope with stress positively.     Exercising on Your Own:  -Group instruction provided by verbal instruction, power point, and written materials to support subject.  Instructors discuss benefits of exercise, components of exercise, frequency and intensity of exercise, and end points for exercise.  Also discuss use of nitroglycerin and activating EMS.  Review options of places to exercise outside of rehab.  Review guidelines for sex with heart disease.   Cardiac Drugs I:  -Group instruction provided by verbal instruction and written materials to support subject.  Instructor reviews cardiac drug classes: antiplatelets, anticoagulants, beta blockers, and statins.  Instructor discusses reasons, side effects, and lifestyle considerations for each drug class.   Cardiac Drugs II:  -Group instruction provided by verbal instruction and written materials to support subject.  Instructor reviews cardiac drug classes: angiotensin converting enzyme inhibitors (ACE-I), angiotensin II receptor blockers (ARBs), nitrates, and calcium channel blockers.  Instructor discusses reasons, side effects, and lifestyle considerations for each drug class.   Anatomy and Physiology of the Circulatory System:  Group verbal and written instruction and models provide basic cardiac anatomy and physiology, with the coronary electrical and arterial systems. Review of: AMI, Angina, Valve disease, Heart Failure, Peripheral Artery Disease, Cardiac Arrhythmia, Pacemakers, and the ICD.   CARDIAC REHAB PHASE II EXERCISE from 07/18/2017 in [facility]  Date  06/27/17   Instruction Review Code  2- meets goals/outcomes      Other Education:  -Group  or individual verbal, written, or video instructions that support the educational goals of the cardiac rehab program.   Knowledge Questionnaire Score: Knowledge Questionnaire Score - 06/05/17 0806      Knowledge Questionnaire Score   Pre Score  22/24       Core Components/Risk Factors/Patient Goals at Admission: Personal Goals and Risk Factors at Admission - 06/05/17 1102      Core Components/Risk Factors/Patient Goals on Admission    Weight Management  Yes;Weight Maintenance    Intervention  Weight Management: Develop a combined nutrition and exercise program designed to reach desired caloric intake, while maintaining appropriate intake of nutrient and fiber, sodium and fats, and appropriate energy expenditure required for the weight goal.;Weight Management: Provide education and appropriate resources to help participant work on and attain dietary goals.;Weight Management/Obesity: Establish reasonable short term and long term weight goals.    Expected Outcomes  Short Term: Continue to assess and modify interventions until short term weight is achieved;Long Term: Adherence to nutrition and physical activity/exercise program aimed toward attainment of established weight goal;Weight Maintenance: Understanding of the daily nutrition guidelines, which includes 25-35% calories from fat, 7% or less cal from saturated fats, less than 200mg  cholesterol, less than 1.5gm of sodium, & 5 or more servings of fruits and vegetables daily;Weight Loss: Understanding of general recommendations for a balanced deficit meal plan, which promotes 1-2 lb weight loss per week and includes a negative energy balance of [phone] kcal/d;Understanding recommendations for meals to include 15-35% energy as protein, 25-35% energy from fat, 35-60% energy from carbohydrates, less than 200mg  of dietary cholesterol, 20-35 gm of total fiber  daily;Understanding of distribution of calorie intake throughout the day with the consumption of 4-5 meals/snacks    Hypertension  Yes    Intervention  Provide education on lifestyle modifcations including regular physical activity/exercise, weight management, moderate sodium restriction and increased consumption of fresh fruit, vegetables, and low fat dairy, alcohol moderation, and smoking cessation.;Monitor prescription use compliance.    Expected Outcomes  Short Term: Continued assessment and intervention until BP is < 140/75mm HG in hypertensive participants. < 130/62mm HG in hypertensive participants with diabetes, heart failure or chronic kidney disease.;Long Term: Maintenance of blood pressure at goal levels.    Lipids  Yes    Intervention  Provide education and support for participant on nutrition & aerobic/resistive exercise along with prescribed medications to achieve LDL 70mg , HDL >40mg .    Expected Outcomes  Short Term: Participant states understanding of desired cholesterol values and is compliant with medications prescribed. Participant is following exercise prescription and nutrition guidelines.;Long Term: Cholesterol controlled with medications as prescribed, with individualized exercise RX and with personalized nutrition plan. Value goals: LDL < 70mg , HDL > 40 mg.       Core Components/Risk Factors/Patient Goals Review:  Goals and Risk Factor Review    Row Name 06/29/17 1354 07/26/17 1407           Core Components/Risk Factors/Patient Goals Review   Personal Goals Review  Weight Management/Obesity;Lipids;Hypertension  Weight Management/Obesity;Lipids;Hypertension      Review  Geoff's vital's have been stable at cardiac rehab. [name] has not had any reports of Angina during exercise  Geoff's vital's have been stable at cardiac rehab. [name] has not had any reports of Angina during exercise      Expected Outcomes  [name] will continue to take his medications for HTN and  Hyperlipidemia. [name] will continue to participate in exercise  [name] will continue to take his medications for HTN  and Hyperlipidemia. [name] will continue to participate in exercise         Core Components/Risk Factors/Patient Goals at Discharge (Final Review):  Goals and Risk Factor Review - 07/26/17 1407      Core Components/Risk Factors/Patient Goals Review   Personal Goals Review  Weight Management/Obesity;Lipids;Hypertension    Review  Geoff's vital's have been stable at cardiac rehab. [name] has not had any reports of Angina during exercise    Expected Outcomes  [name] will continue to take his medications for HTN and Hyperlipidemia. [name] will continue to participate in exercise       ITP Comments: ITP Comments    Row Name 06/05/17 0741 06/29/17 1400 07/26/17 1407       ITP Comments  Medical Director- Dr. Fransico Him, MD.  30 Day ITP Review, Patient with good partcipation and good attendance in cardiac rehab  30 Day ITP Review, Patient with good partcipation and good attendance in cardiac rehab        Comments: See ITP comments.[name], RN,BSN 07/26/2017 2:09 PM

## 2017-07-27 ENCOUNTER — Encounter (HOSPITAL_COMMUNITY)
Admission: RE | Admit: 2017-07-27 | Discharge: 2017-07-27 | Disposition: A | Discharge status: Home / Self Care | Payer: Medicare Other | Source: Ambulatory Visit | Attending: Interventional Cardiology | Admitting: Interventional Cardiology

## 2017-07-27 DIAGNOSIS — Z955 Presence of coronary angioplasty implant and graft: Secondary | ICD-10-CM | POA: Diagnosis not present

## 2017-07-30 ENCOUNTER — Encounter (HOSPITAL_COMMUNITY): Payer: Medicare Other

## 2017-08-01 ENCOUNTER — Encounter (HOSPITAL_COMMUNITY): Payer: Medicare Other

## 2017-08-03 ENCOUNTER — Encounter (HOSPITAL_COMMUNITY)
Admission: RE | Admit: 2017-08-03 | Discharge: 2017-08-03 | Disposition: A | Discharge status: Home / Self Care | Payer: Medicare Other | Source: Ambulatory Visit | Attending: Interventional Cardiology | Admitting: Interventional Cardiology

## 2017-08-03 DIAGNOSIS — Z955 Presence of coronary angioplasty implant and graft: Secondary | ICD-10-CM | POA: Diagnosis not present

## 2017-08-06 ENCOUNTER — Encounter (HOSPITAL_COMMUNITY)
Admission: RE | Admit: 2017-08-06 | Discharge: 2017-08-06 | Disposition: A | Discharge status: Home / Self Care | Payer: Medicare Other | Source: Ambulatory Visit | Attending: Interventional Cardiology | Admitting: Interventional Cardiology

## 2017-08-06 DIAGNOSIS — Z955 Presence of coronary angioplasty implant and graft: Secondary | ICD-10-CM

## 2017-08-07 ENCOUNTER — Other Ambulatory Visit: Payer: Self-pay | Admitting: Interventional Cardiology

## 2017-08-07 MED ORDER — ROSUVASTATIN CALCIUM 40 MG PO TABS: 40.0000 mg | ORAL_TABLET | Freq: Every day | ORAL | 0 refills | Status: DC

## 2017-08-07 NOTE — Telephone Encounter (Signed)
Pt calling requesting a 15 day supply of rosuvastatin until mail order arrives. I sent in a 15 day supply to pt's pharmacy as requested. Confirmation received.

## 2017-08-08 ENCOUNTER — Encounter (HOSPITAL_COMMUNITY)
Admission: RE | Admit: 2017-08-08 | Discharge: 2017-08-08 | Disposition: A | Discharge status: Home / Self Care | Payer: Medicare Other | Source: Ambulatory Visit | Attending: Interventional Cardiology | Admitting: Interventional Cardiology

## 2017-08-08 DIAGNOSIS — Z955 Presence of coronary angioplasty implant and graft: Secondary | ICD-10-CM | POA: Diagnosis not present

## 2017-08-09 ENCOUNTER — Encounter: Payer: Self-pay | Admitting: Family Medicine

## 2017-08-10 ENCOUNTER — Encounter (HOSPITAL_COMMUNITY): Payer: Medicare Other

## 2017-08-13 ENCOUNTER — Encounter (HOSPITAL_COMMUNITY)
Admission: RE | Admit: 2017-08-13 | Discharge: 2017-08-13 | Disposition: A | Discharge status: Home / Self Care | Payer: Medicare Other | Source: Ambulatory Visit | Attending: Interventional Cardiology | Admitting: Interventional Cardiology

## 2017-08-13 DIAGNOSIS — Z955 Presence of coronary angioplasty implant and graft: Secondary | ICD-10-CM

## 2017-08-15 ENCOUNTER — Encounter (HOSPITAL_COMMUNITY)
Admission: RE | Admit: 2017-08-15 | Discharge: 2017-08-15 | Disposition: A | Discharge status: Home / Self Care | Payer: Medicare Other | Source: Ambulatory Visit | Attending: Interventional Cardiology | Admitting: Interventional Cardiology

## 2017-08-15 DIAGNOSIS — Z955 Presence of coronary angioplasty implant and graft: Secondary | ICD-10-CM | POA: Diagnosis not present

## 2017-08-17 ENCOUNTER — Encounter (HOSPITAL_COMMUNITY)
Admission: RE | Admit: 2017-08-17 | Discharge: 2017-08-17 | Disposition: A | Discharge status: Home / Self Care | Payer: Medicare Other | Source: Ambulatory Visit | Attending: Interventional Cardiology | Admitting: Interventional Cardiology

## 2017-08-17 DIAGNOSIS — Z955 Presence of coronary angioplasty implant and graft: Secondary | ICD-10-CM

## 2017-08-20 ENCOUNTER — Encounter (HOSPITAL_COMMUNITY): Payer: Medicare Other

## 2017-08-22 ENCOUNTER — Encounter (HOSPITAL_COMMUNITY): Payer: Self-pay | Admitting: *Deleted

## 2017-08-22 ENCOUNTER — Encounter (HOSPITAL_COMMUNITY): Payer: Medicare Other

## 2017-08-22 DIAGNOSIS — Z955 Presence of coronary angioplasty implant and graft: Secondary | ICD-10-CM

## 2017-08-22 NOTE — Progress Notes (Signed)
Cardiac Individual Treatment Plan  Patient Details  Name: Shane Shelton MRN: [id number] Date of Birth: Dec 17, 1945 Referring Provider:     [place] from 06/05/2017 in [place]  Referring Provider  Olen Pel, MD.      Initial Encounter Date:    CARDIAC REHAB PHASE II ORIENTATION from 06/05/2017 in [place]  Date  06/05/17  Referring Provider  Daneen Schick III, MD.      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  aspirin EC 81 MG tablet, Take 81 mg by mouth daily., Disp: , Rfl:  .  Coenzyme Q-10 200 MG CAPS, Take 200 mg by mouth daily., Disp: , Rfl:  .  lisinopril (PRINIVIL,ZESTRIL) 5 MG tablet, Take 1 tablet (5 mg total) by mouth daily., Disp: 90 tablet, Rfl: 3 .  metoprolol succinate (TOPROL-XL) 25 MG 24 hr tablet, Take 0.5 tablets (12.5 mg total) by mouth daily., Disp: 90 tablet, Rfl: 3 .  pantoprazole (PROTONIX) 40 MG tablet, Take 40 mg by mouth daily as needed (GERD)., Disp: , Rfl:  .  rosuvastatin (CRESTOR) 40 MG tablet, Take 1 tablet (40 mg total) by mouth daily., Disp: 15 tablet, Rfl: 0 .  ticagrelor (BRILINTA) 90 MG TABS tablet, Take 1 tablet (90 mg total) by mouth 2 (two) times daily., Disp: 180 tablet, Rfl: 3  Past Medical History: Past Medical History:  Diagnosis Date  . Cancer [facility])    Throat cancer 2011  . Coronary artery disease   . Hyperlipidemia   . Hypertension     Tobacco Use: Social History   Tobacco Use  Smoking Status Former Smoker  . Packs/day: 1.00  . Years: 40.00  . Pack years: 40.00  . Types: Cigarettes  . Last attempt to quit: 10/05/2009  . Years since quitting: 7.8  Smokeless Tobacco Never Used    Labs: Recent Review Scientist, physiological    Labs for ITP Cardiac and Pulmonary Rehab Latest Ref Rng & Units 10/05/2016 12/14/2016  04/04/2017 07/17/2017   Cholestrol 100 - 199 mg/dL - - - 137   LDLCALC 0 - 99 mg/dL - - - 73   LDLDIRECT 0 - 99 mg/dL - 153(H) 100(H) -   HDL >39 mg/dL - - - 45   Trlycerides 0 - 149 mg/dL - - - 95   Hemoglobin A1c - 5.8 - - -      Capillary Blood Glucose: No results found for: GLUCAP   Exercise Target Goals:    Exercise Program Goal: Individual exercise prescription set using results from initial 6 min walk test and THRR while considering  patient's activity barriers and safety.   Exercise Prescription Goal: Initial exercise prescription builds to 30-45 minutes a day of aerobic activity, 2-3 days per week.  Home exercise guidelines will be given to patient during program as part of exercise prescription that the participant will acknowledge.  Activity Barriers & Risk Stratification:   6 Minute Walk:   Oxygen Initial Assessment:   Oxygen Re-Evaluation:   Oxygen Discharge (Final Oxygen Re-Evaluation):   Initial Exercise Prescription:   Perform Capillary Blood Glucose checks as needed.  Exercise Prescription Changes: Exercise Prescription Changes    Row Name 06/15/17 1601 06/26/17 1600 07/06/17 1526 07/16/17 1517 08/08/17 1400     Response to Exercise   Blood Pressure (Admit)  118/62  110/70  116/62  112/80  108/70   Blood Pressure (Exercise)  158/64  136/80  156/80  146/82  138/8   Blood Pressure (Exit)  100/62  102/68  98/60  110/84  104/70   Heart Rate (Admit)  68 bpm  76 bpm  63 bpm  70 bpm  59 bpm   Heart Rate (Exercise)  120 bpm  121 bpm  122 bpm  120 bpm  121 bpm   Heart Rate (Exit)  72 bpm  76 bpm  69 bpm  78 bpm  62 bpm   Rating of Perceived Exertion (Exercise)  13  13  13  14  13    Symptoms  none  none  none  none  None   Comments  pt oriented to exercise equipment today  -  -  pt does HIIT  Pt does HITT    Duration  Continue with 30 min of aerobic exercise without signs/symptoms of physical distress.  Continue with 30 min of aerobic exercise without  signs/symptoms of physical distress.  Continue with 30 min of aerobic exercise without signs/symptoms of physical distress.  Continue with 30 min of aerobic exercise without signs/symptoms of physical distress.  Progress to 45 minutes of aerobic exercise without signs/symptoms of physical distress   Intensity  THRR unchanged  THRR unchanged  THRR unchanged  THRR unchanged  THRR unchanged     Progression   Progression  Continue to progress workloads to maintain intensity without signs/symptoms of physical distress.  Continue to progress workloads to maintain intensity without signs/symptoms of physical distress.  Continue to progress workloads to maintain intensity without signs/symptoms of physical distress.  Continue to progress workloads to maintain intensity without signs/symptoms of physical distress.  Continue to progress workloads to maintain intensity without signs/symptoms of physical distress.   Average METs  4.1  4.3  5.4  5.6  5.96     Resistance Training   Training Prescription  Yes  Yes  Yes  Yes  No   Weight  5lbs  5lbs  6lbs  6lbs  -   Reps  10-15  10-15  10-15  10-15  -   Time  10 Minutes  10 Minutes  10 Minutes  10 Minutes  -     Interval Training   Interval Training  -  -  -  Yes  Yes   Equipment  -  -  -  Treadmill;Bike;NuStep  Treadmill;Bike;NuStep   Comments  -  -  -  30" on and 2' off  30 Secs and 2 mins off      Treadmill   MPH  3  3  3.2  3.3  3.5   Grade  1  1  2  10  3.3/2 mod. intensity for 8' and HIIT for 2' @ 3.3/10  2 3.5/2.0 for Mod Intensity, 3.5/15.0 for HITT   Minutes  15  10  10  10  15    METs  3.71  3.71  4.33  5.2  5.96     Bike   Level  1.8  1.8  1.8  2 HIIT @ 5.5 for 2' and 2.0 mod intensity for 8'  -   Minutes  15  10  10  10   -   METs  4.49  4.49  4.45  6.2  -     NuStep   Level  -  4  6  6  7    SPM  -  100  120  100  100  Minutes  -  10  10  10  15    METs  -  4.8  7.4  5.4  5.6     Home Exercise Plan   Plans to continue exercise at  -  -   [facility] (comment) fitness center  [facility] (comment) fitness center  [facility] (comment) [place] 3 additional days to program exercise sessions.  Add 3 additional days to program exercise sessions.  Add 3 additional days to program exercise sessions.   Initial Home Exercises Provided  -  -  06/27/17  06/27/17  06/27/17   Row Name 08/15/17 0851             Response to Exercise   Blood Pressure (Admit)  106/70       Blood Pressure (Exercise)  168/90       Blood Pressure (Exit)  118/64       Heart Rate (Admit)  59 bpm       Heart Rate (Exercise)  126 bpm       Heart Rate (Exit)  80 bpm       Rating of Perceived Exertion (Exercise)  16       Symptoms  None       Comments  Pt does HITT        Duration  Continue with 45 min of aerobic exercise without signs/symptoms of physical distress.       Intensity  THRR unchanged         Progression   Progression  Continue to progress workloads to maintain intensity without signs/symptoms of physical distress.       Average METs  5.94         Resistance Training   Training Prescription  No         Interval Training   Interval Training  Yes       Equipment  Treadmill;Bike;NuStep       Comments  30 Secs and 2 mins off          Treadmill   MPH  3.5       Grade  2 3.5/2.0 for Mod Intensity, 3.5/15.0 for HITT       Minutes  10       METs  5.9         Bike   Level  2.2 HIIT at level 5.5, 2 mins        Minutes  10       METs  6.42         NuStep   Level  7 HIIT at level 8, 2 mins       SPM  100       Minutes  10       METs  5.5         Home Exercise Plan   Plans to continue exercise at  [facility] (comment) [place]       Frequency  Add 3 additional days to program exercise sessions.       Initial Home Exercises Provided  06/27/17          Exercise Comments: Exercise Comments    Row Name 06/15/17 1603 06/27/17 1452 07/17/17 1528 08/08/17 1440 08/22/17 0856    Exercise Comments  Pt was oriented to exercise equipment and responded well to exercise priogram. Pt will continued to be monitored in which pt will exercise safely and progress as tolerated.  Reviewed HEP on 06/27/17  Reviewed METs and goals. Pt is tolerating Ex Rx very well; will continue to monitor pt's progress and activity levels.   Reviewed METs with pt. Pt is responding to HITT program very well. Will continue to monitor and progress pt.   Patient is enjoying the HITT program and is responding well to the workloads. Will continue to follow and progress patient.      Exercise Goals and Review:   Exercise Goals Re-Evaluation : Exercise Goals Re-Evaluation    Row Name 06/26/17 1604 06/27/17 1453 07/17/17 1529 08/22/17 0900 08/22/17 0901     Exercise Goal Re-Evaluation   Exercise Goals Review  Increase Physical Activity;Understanding of Exercise Prescription;Increase Strength and Stamina;Able to understand and use rate of perceived exertion (RPE) scale  Increase Physical Activity;Able to understand and use rate of perceived exertion (RPE) scale;Knowledge and understanding of Target Heart Rate Range (THRR);Understanding of Exercise Prescription;Increase Strength and Stamina;Able to check pulse independently  Increase Physical Activity;Able to understand and use rate of perceived exertion (RPE) scale;Knowledge and understanding of Target Heart Rate Range (THRR);Understanding of Exercise Prescription;Increase Strength and Stamina;Able to check pulse independently  Increase Physical Activity;Able to understand and use rate of perceived exertion (RPE) scale;Knowledge and understanding of Target Heart Rate Range (THRR);Understanding of Exercise Prescription;Increase Strength and Stamina;Able to check pulse independently  -   Comments  Pt has a great understanding of exercise program. Pt demonstrates proper use of RPE scale.  Reviewed home exercise with pt today.  Pt plans to do yoga, walk and or go to  fitness center for exercise.  Reviewed THR, pulse, RPE, sign and symptoms, NTG use, and when to call 911 or MD.  Also discussed weather considerations and indoor options.  Pt voiced understanding.  Pt was introduced to HIIT on 07/13/17. Pt responded well to exercise change and enjoys the variety of exercise program  -  Pt is doing well in cardiac rehab with HIIT program. Pt likes the challenge of program and can tell he is stronger.    Expected Outcomes  Pt will continue to exercise safely in cardiac rehab and exercise for 30 minutes without difficulty.   Pt will continue to exercise safely in cardiac rehab and exercise for 30 minutes without difficulty.   Pt will continue to exercise safely in cardiac rehab and exercise for 30 minutes without difficulty.   Pt will continue to improve cardiorespiratory fitness. Patient is   Pt will continue to improve cardiorespiratory fitness. Patient is exercising at local fitness center and really enjoys yoga as part of his HEP.        Discharge Exercise Prescription (Final Exercise Prescription Changes): Exercise Prescription Changes - 08/15/17 0851      Response to Exercise   Blood Pressure (Admit)  106/70    Blood Pressure (Exercise)  168/90    Blood Pressure (Exit)  118/64    Heart Rate (Admit)  59 bpm    Heart Rate (Exercise)  126 bpm    Heart Rate (Exit)  80 bpm    Rating of Perceived Exertion (Exercise)  16    Symptoms  None    Comments  Pt does HITT     Duration  Continue with 45 min of aerobic exercise without signs/symptoms of physical distress.    Intensity  THRR unchanged      Progression   Progression  Continue to progress workloads to maintain intensity without signs/symptoms of physical distress.    Average METs  5.94  Resistance Training   Training Prescription  No      Interval Training   Interval Training  Yes    Equipment  Treadmill;Bike;NuStep    Comments  30 Secs and 2 mins off       Treadmill   MPH  3.5    Grade  2  3.5/2.0 for Mod Intensity, 3.5/15.0 for HITT    Minutes  10    METs  5.9      Bike   Level  2.2 HIIT at level 5.5, 2 mins     Minutes  10    METs  6.42      NuStep   Level  7 HIIT at level 8, 2 mins    SPM  100    Minutes  10    METs  5.5      Home Exercise Plan   Plans to continue exercise at  [facility] (comment) [place]    Frequency  Add 3 additional days to program exercise sessions.    Initial Home Exercises Provided  06/27/17       Nutrition:  Target Goals: Understanding of nutrition guidelines, daily intake of sodium 1500mg , cholesterol 200mg , calories 30% from fat and 7% or less from saturated fats, daily to have 5 or more servings of fruits and vegetables.  Biometrics:    Nutrition Therapy Plan and Nutrition Goals:   Nutrition Assessments:   Nutrition Goals Re-Evaluation:   Nutrition Goals Re-Evaluation:   Nutrition Goals Discharge (Final Nutrition Goals Re-Evaluation):   Psychosocial: Target Goals: Acknowledge presence or absence of significant depression and/or stress, maximize coping skills, provide positive support system. Participant is able to verbalize types and ability to use techniques and skills needed for reducing stress and depression.  Initial Review & Psychosocial Screening:   Quality of Life Scores:  Scores of 19 and below usually indicate a poorer quality of life in these areas.  A difference of  2-3 points is a clinically meaningful difference.  A difference of 2-3 points in the total score of the Quality of Life Index has been associated with significant improvement in overall quality of life, self-image, physical symptoms, and general health in studies assessing change in quality of life.  PHQ-9: Recent Review Flowsheet Data    Depression screen [facility] 2/9 06/18/2017 04/04/2017 10/05/2016   Decreased Interest 0 0 0   Down, Depressed, Hopeless 0 0 0   PHQ - 2 Score 0 0 0     Interpretation of Total Score  Total Score  Depression Severity:  1-4 = Minimal depression, 5-9 = Mild depression, 10-14 = Moderate depression, 15-19 = Moderately severe depression, 20-27 = Severe depression   Psychosocial Evaluation and Intervention:   Psychosocial Re-Evaluation: Psychosocial Re-Evaluation    [place] Name 06/29/17 1358 07/26/17 1407 08/22/17 0937         Psychosocial Re-Evaluation   Current issues with  None Identified  None Identified  None Identified     Interventions  Encouraged to attend Cardiac Rehabilitation for the exercise  Encouraged to attend Cardiac Rehabilitation for the exercise  Encouraged to attend Cardiac Rehabilitation for the exercise     Continue Psychosocial Services   No Follow up required  No Follow up required  No Follow up required        Psychosocial Discharge (Final Psychosocial Re-Evaluation): Psychosocial Re-Evaluation - 08/22/17 8546      Psychosocial Re-Evaluation   Current issues with  None Identified    Interventions  Encouraged to attend  Cardiac Rehabilitation for the exercise    Continue Psychosocial Services   No Follow up required       Vocational Rehabilitation: Provide vocational rehab assistance to qualifying candidates.   Vocational Rehab Evaluation & Intervention:   Education: Education Goals: Education classes will be provided on a weekly basis, covering required topics. Participant will state understanding/return demonstration of topics presented.  Learning Barriers/Preferences:   Education Topics: Count Your Pulse:  -Group instruction provided by verbal instruction, demonstration, patient participation and written materials to support subject.  Instructors address importance of being able to find your pulse and how to count your pulse when at home without a heart monitor.  Patients get hands on experience counting their pulse with staff help and individually.   Heart Attack, Angina, and Risk Factor Modification:  -Group instruction provided by verbal  instruction, video, and written materials to support subject.  Instructors address signs and symptoms of angina and heart attacks.    Also discuss risk factors for heart disease and how to make changes to improve heart health risk factors.   Functional Fitness:  -Group instruction provided by verbal instruction, demonstration, patient participation, and written materials to support subject.  Instructors address safety measures for doing things around the house.  Discuss how to get up and down off the floor, how to pick things up properly, how to safely get out of a chair without assistance, and balance training.   CARDIAC REHAB PHASE II EXERCISE from 08/08/2017 in [place]  Date  07/27/17  Instruction Review Code  2- meets goals/outcomes      Meditation and Mindfulness:  -Group instruction provided by verbal instruction, patient participation, and written materials to support subject.  Instructor addresses importance of mindfulness and meditation practice to help reduce stress and improve awareness.  Instructor also leads participants through a meditation exercise.    Stretching for Flexibility and Mobility:  -Group instruction provided by verbal instruction, patient participation, and written materials to support subject.  Instructors lead participants through series of stretches that are designed to increase flexibility thus improving mobility.  These stretches are additional exercise for major muscle groups that are typically performed during regular warm up and cool down.   CARDIAC REHAB PHASE II EXERCISE from 08/08/2017 in [place]  Date  08/03/17  Instruction Review Code  2- meets goals/outcomes      Hands Only CPR:  -Group verbal, video, and participation provides a basic overview of AHA guidelines for community CPR. Role-play of emergencies allow participants the opportunity to practice calling for help and chest  compression technique with discussion of AED use.   Hypertension: -Group verbal and written instruction that provides a basic overview of hypertension including the most recent diagnostic guidelines, risk factor reduction with self-care instructions and medication management.    Nutrition I class: Heart Healthy Eating:  -Group instruction provided by PowerPoint slides, verbal discussion, and written materials to support subject matter. The instructor gives an explanation and review of the Therapeutic Lifestyle Changes diet recommendations, which includes a discussion on lipid goals, dietary fat, sodium, fiber, plant stanol/sterol esters, sugar, and the components of a well-balanced, healthy diet.   Nutrition II class: Lifestyle Skills:  -Group instruction provided by PowerPoint slides, verbal discussion, and written materials to support subject matter. The instructor gives an explanation and review of label reading, grocery shopping for heart health, heart healthy recipe modifications, and ways to make healthier choices when eating out.  Diabetes Question & Answer:  -Group instruction provided by PowerPoint slides, verbal discussion, and written materials to support subject matter. The instructor gives an explanation and review of diabetes co-morbidities, pre- and post-prandial blood glucose goals, pre-exercise blood glucose goals, signs, symptoms, and treatment of hypoglycemia and hyperglycemia, and foot care basics.   Diabetes Blitz:  -Group instruction provided by PowerPoint slides, verbal discussion, and written materials to support subject matter. The instructor gives an explanation and review of the physiology behind type 1 and type 2 diabetes, diabetes medications and rational behind using different medications, pre- and post-prandial blood glucose recommendations and Hemoglobin A1c goals, diabetes diet, and exercise including blood glucose guidelines for exercising safely.    Portion  Distortion:  -Group instruction provided by PowerPoint slides, verbal discussion, written materials, and food models to support subject matter. The instructor gives an explanation of serving size versus portion size, changes in portions sizes over the last 20 years, and what consists of a serving from each food group.   CARDIAC REHAB PHASE II EXERCISE from 08/08/2017 in [place]  Date  07/18/17  Educator  RD  Instruction Review Code  2- meets goals/outcomes      Stress Management:  -Group instruction provided by verbal instruction, video, and written materials to support subject matter.  Instructors review role of stress in heart disease and how to cope with stress positively.     CARDIAC REHAB PHASE II EXERCISE from 08/08/2017 in [place]  Date  07/25/17  Instruction Review Code  2- meets goals/outcomes      Exercising on Your Own:  -Group instruction provided by verbal instruction, power point, and written materials to support subject.  Instructors discuss benefits of exercise, components of exercise, frequency and intensity of exercise, and end points for exercise.  Also discuss use of nitroglycerin and activating EMS.  Review options of places to exercise outside of rehab.  Review guidelines for sex with heart disease.   Cardiac Drugs I:  -Group instruction provided by verbal instruction and written materials to support subject.  Instructor reviews cardiac drug classes: antiplatelets, anticoagulants, beta blockers, and statins.  Instructor discusses reasons, side effects, and lifestyle considerations for each drug class.   CARDIAC REHAB PHASE II EXERCISE from 08/08/2017 in [place]  Date  08/08/17  Educator  Pharmacy  Instruction Review Code  2- meets goals/outcomes      Cardiac Drugs II:  -Group instruction provided by verbal instruction and written materials to support subject.   Instructor reviews cardiac drug classes: angiotensin converting enzyme inhibitors (ACE-I), angiotensin II receptor blockers (ARBs), nitrates, and calcium channel blockers.  Instructor discusses reasons, side effects, and lifestyle considerations for each drug class.   Anatomy and Physiology of the Circulatory System:  Group verbal and written instruction and models provide basic cardiac anatomy and physiology, with the coronary electrical and arterial systems. Review of: AMI, Angina, Valve disease, Heart Failure, Peripheral Artery Disease, Cardiac Arrhythmia, Pacemakers, and the ICD.   CARDIAC REHAB PHASE II EXERCISE from 08/08/2017 in [place]  Date  06/27/17  Instruction Review Code  2- meets goals/outcomes      Other Education:  -Group or individual verbal, written, or video instructions that support the educational goals of the cardiac rehab program.   Knowledge Questionnaire Score:   Core Components/Risk Factors/Patient Goals at Admission:   Core Components/Risk Factors/Patient Goals Review:  Goals and Risk Factor Review  [place] Name 06/29/17 1354 07/26/17 1407 08/22/17 0937         Core Components/Risk Factors/Patient Goals Review   Personal Goals Review  Weight Management/Obesity;Lipids;Hypertension  Weight Management/Obesity;Lipids;Hypertension  Weight Management/Obesity;Lipids;Hypertension     Review  Geoff's vital's have been stable at cardiac rehab. [name] has not had any reports of Angina during exercise  Geoff's vital's have been stable at cardiac rehab. [name] has not had any reports of Angina during exercise  Geoff's vital's have been stable at cardiac rehab. [name] has not had any reports of Angina during exercise     Expected Outcomes  [name] will continue to take his medications for HTN and Hyperlipidemia. [name] will continue to participate in exercise  [name] will continue to take his medications for HTN and Hyperlipidemia. [name] will  continue to participate in exercise  [name] will continue to take his medications for HTN and Hyperlipidemia. [name] will continue to participate in exercise        Core Components/Risk Factors/Patient Goals at Discharge (Final Review):  Goals and Risk Factor Review - 08/22/17 0937      Core Components/Risk Factors/Patient Goals Review   Personal Goals Review  Weight Management/Obesity;Lipids;Hypertension    Review  Geoff's vital's have been stable at cardiac rehab. [name] has not had any reports of Angina during exercise    Expected Outcomes  [name] will continue to take his medications for HTN and Hyperlipidemia. [name] will continue to participate in exercise       ITP Comments: ITP Comments    Row Name 06/29/17 1400 07/26/17 1407 08/22/17 0937       ITP Comments  30 Day ITP Review, Patient with good partcipation and good attendance in cardiac rehab  30 Day ITP Review, Patient with good partcipation and good attendance in cardiac rehab  30 Day ITP Review, Patient with good partcipation and good attendance in cardiac rehab        Comments: See ITP comments.[name], RN,BSN 08/22/2017 9:39 AM

## 2017-08-24 ENCOUNTER — Encounter (HOSPITAL_COMMUNITY): Payer: Medicare Other

## 2017-08-27 ENCOUNTER — Encounter (HOSPITAL_COMMUNITY)
Admission: RE | Admit: 2017-08-27 | Discharge: 2017-08-27 | Disposition: A | Discharge status: Home / Self Care | Payer: Medicare Other | Source: Ambulatory Visit | Attending: Interventional Cardiology | Admitting: Interventional Cardiology

## 2017-08-27 DIAGNOSIS — Z955 Presence of coronary angioplasty implant and graft: Secondary | ICD-10-CM

## 2017-08-29 ENCOUNTER — Encounter (HOSPITAL_COMMUNITY)
Admission: RE | Admit: 2017-08-29 | Discharge: 2017-08-29 | Disposition: A | Discharge status: Home / Self Care | Payer: Medicare Other | Source: Ambulatory Visit | Attending: Interventional Cardiology | Admitting: Interventional Cardiology

## 2017-08-29 DIAGNOSIS — Z955 Presence of coronary angioplasty implant and graft: Secondary | ICD-10-CM | POA: Diagnosis not present

## 2017-08-31 ENCOUNTER — Encounter (HOSPITAL_COMMUNITY): Payer: Medicare Other

## 2017-09-03 ENCOUNTER — Encounter (HOSPITAL_COMMUNITY)
Admission: RE | Admit: 2017-09-03 | Discharge: 2017-09-03 | Disposition: A | Discharge status: Home / Self Care | Payer: Medicare Other | Source: Ambulatory Visit | Attending: Interventional Cardiology | Admitting: Interventional Cardiology

## 2017-09-03 VITALS — Ht 73.0 in | Wt 219.1 lb

## 2017-09-03 DIAGNOSIS — Z955 Presence of coronary angioplasty implant and graft: Secondary | ICD-10-CM | POA: Diagnosis not present

## 2017-09-04 ENCOUNTER — Other Ambulatory Visit: Payer: Self-pay | Admitting: Family Medicine

## 2017-09-04 DIAGNOSIS — E7800 Pure hypercholesterolemia, unspecified: Secondary | ICD-10-CM

## 2017-09-05 ENCOUNTER — Encounter (HOSPITAL_COMMUNITY)
Admission: RE | Admit: 2017-09-05 | Discharge: 2017-09-05 | Disposition: A | Discharge status: Home / Self Care | Payer: Medicare Other | Source: Ambulatory Visit | Attending: Interventional Cardiology | Admitting: Interventional Cardiology

## 2017-09-05 DIAGNOSIS — Z955 Presence of coronary angioplasty implant and graft: Secondary | ICD-10-CM

## 2017-09-07 ENCOUNTER — Encounter (HOSPITAL_COMMUNITY)
Admission: RE | Admit: 2017-09-07 | Discharge: 2017-09-07 | Disposition: A | Discharge status: Home / Self Care | Payer: Medicare Other | Source: Ambulatory Visit | Attending: Interventional Cardiology | Admitting: Interventional Cardiology

## 2017-09-07 DIAGNOSIS — Z955 Presence of coronary angioplasty implant and graft: Secondary | ICD-10-CM

## 2017-09-10 ENCOUNTER — Encounter (HOSPITAL_COMMUNITY)
Admission: RE | Admit: 2017-09-10 | Discharge: 2017-09-10 | Disposition: A | Discharge status: Home / Self Care | Payer: Medicare Other | Source: Ambulatory Visit | Attending: Interventional Cardiology | Admitting: Interventional Cardiology

## 2017-09-10 DIAGNOSIS — Z955 Presence of coronary angioplasty implant and graft: Secondary | ICD-10-CM | POA: Diagnosis not present

## 2017-09-10 NOTE — Progress Notes (Addendum)
Discharge Progress Report  Patient Details  Name: Shane Shelton MRN: [id number] Date of Birth: Jan 16, 1946 Referring Provider:     Day from 06/05/2017 in [place]  Referring Provider  Olen Pel, MD.       Number of Visits: 25  Reason for Discharge:  Patient independent in their exercise.  Smoking History:  Social History   Tobacco Use  Smoking Status Former Smoker  . Packs/day: 1.00  . Years: 40.00  . Pack years: 40.00  . Types: Cigarettes  . Last attempt to quit: 10/05/2009  . 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      Treadmill   MPH  3    Grade  1    Minutes  10    METs  3.71      Bike   Level  1.8    Minutes  10    METs  4.49      NuStep   Level  4    SPM  90    Minutes  10    METs  3.5      Prescription Details   Frequency (times per week)  3    Duration  Progress to 30 minutes of continuous aerobic without signs/symptoms of physical distress      Intensity   THRR 40-80% of Max Heartrate  60-121    Ratings of Perceived Exertion  11-13    Perceived Dyspnea  0-4      Progression   Progression  Continue to progress workloads to maintain intensity without signs/symptoms of physical distress.      Resistance Training   Training Prescription  Yes    Weight  4lbs    Reps  10-15       Discharge Exercise Prescription (Final Exercise Prescription Changes): Exercise Prescription Changes - 09/17/17 0900      Response to Exercise   Blood Pressure (Admit)  120/70    Blood Pressure (Exercise)  142/80    Blood Pressure (Exit)  106/68    Heart Rate (Admit)  57 bpm     Heart Rate (Exercise)  120 bpm    Heart Rate (Exit)  64 bpm    Rating of Perceived Exertion (Exercise)  15    Symptoms  None    Comments  Pt does HIIT    Duration  Continue with 45 min of aerobic exercise without signs/symptoms of physical distress.    Intensity  THRR unchanged      Progression   Progression  Continue to progress workloads to maintain intensity without signs/symptoms of physical distress.    Average METs  6.2      Resistance Training   Training Prescription  Yes    Weight  8lbs    Reps  10-15    Time  10 Minutes      Interval Training   Interval Training  Yes    Equipment  Treadmill;Bike;NuStep    Comments  30 Secs and 2 mins off       Treadmill   MPH  4.2    Grade  2 Mod Intensity 2.0,  High Intensity 15.0     Minutes  10    METs  6.88      Bike   Level  2.2 HIIT level at 5.5, 2 mins    Minutes  10    METs  6.43      NuStep   Level  7 HIIT Level 8, 2 mins    SPM  100    Minutes  10    METs  5.3      Home Exercise Plan   Plans to continue exercise at  [facility] (comment)    Frequency  Add 3 additional days to program exercise sessions.    Initial Home Exercises Provided  06/27/17       Functional Capacity: 6 Minute Walk    Row Name 06/05/17 0907 09/03/17 1535       6 Minute Walk   Phase  Initial  Discharge    Distance  2245 feet  2266 feet    Distance % Change  -  0.94 %    Distance Feet Change  -  21 ft    Walk Time  6 minutes  6 minutes    # of Rest Breaks  0  0    MPH  4.25  4.29    METS  4.95  4.72    RPE  14  12    Perceived Dyspnea   -  0    VO2 Peak  17.34  16.53    Symptoms  Yes (comment)  No    Comments  Patient c/o slight left arm pain that resolved with rest.  -    Resting HR  54 bpm  61 bpm    Resting BP  108/60  124/80    Resting Oxygen Saturation   98 %  -    Exercise Oxygen Saturation  during 6 min walk  98 %  -    Max Ex. HR  109 bpm  103 bpm    Max Ex. BP  160/80  146/72    2 Minute Post BP  104/64   106/62       Psychological, QOL, Others - Outcomes: PHQ 2/9: Depression screen [facility] 2/9 09/10/2017 06/18/2017 04/04/2017 10/05/2016  Decreased Interest 0 0 0 0  Down, Depressed, Hopeless 0 0 0 0  PHQ - 2 Score 0 0 0 0    Quality of Life: Quality of Life - 09/05/17 1642      Quality of Life Scores   Health/Function Pre  27.5 %    Health/Function Post  26.46 %    Health/Function % Change  -3.78 %    Socioeconomic Pre  29.69 %    Socioeconomic Post  26.92 %    Socioeconomic % Change   -9.33 %    Psych/Spiritual Pre  27.36 %    Psych/Spiritual Post  27.33 %    Psych/Spiritual % Change  -0.11 %    Family Pre  30 %    Family Post  25.9 %    Family % Change  -13.67 %    GLOBAL Pre  28.33 %    GLOBAL Post  26.63 %    GLOBAL % Change  -6 %       Personal Goals: Goals established at orientation with interventions provided to work toward goal. Personal Goals and Risk Factors at Admission - 06/05/17 1102      Core Components/Risk Factors/Patient Goals on Admission    Weight Management  Yes;Weight Maintenance  Intervention  Weight Management: Develop a combined nutrition and exercise program designed to reach desired caloric intake, while maintaining appropriate intake of nutrient and fiber, sodium and fats, and appropriate energy expenditure required for the weight goal.;Weight Management: Provide education and appropriate resources to help participant work on and attain dietary goals.;Weight Management/Obesity: Establish reasonable short term and long term weight goals.    Expected Outcomes  Short Term: Continue to assess and modify interventions until short term weight is achieved;Long Term: Adherence to nutrition and physical activity/exercise program aimed toward attainment of established weight goal;Weight Maintenance: Understanding of the daily nutrition guidelines, which includes 25-35% calories from fat, 7% or less cal from saturated fats, less than 232m cholesterol, less than 1.5gm  of sodium, & 5 or more servings of fruits and vegetables daily;Weight Loss: Understanding of general recommendations for a balanced deficit meal plan, which promotes 1-2 lb weight loss per week and includes a negative energy balance of [phone] kcal/d;Understanding recommendations for meals to include 15-35% energy as protein, 25-35% energy from fat, 35-60% energy from carbohydrates, less than 2031mof dietary cholesterol, 20-35 gm of total fiber daily;Understanding of distribution of calorie intake throughout the day with the consumption of 4-5 meals/snacks    Hypertension  Yes    Intervention  Provide education on lifestyle modifcations including regular physical activity/exercise, weight management, moderate sodium restriction and increased consumption of fresh fruit, vegetables, and low fat dairy, alcohol moderation, and smoking cessation.;Monitor prescription use compliance.    Expected Outcomes  Short Term: Continued assessment and intervention until BP is < 140/9015mG in hypertensive participants. < 130/48m71m in hypertensive participants with diabetes, heart failure or chronic kidney disease.;Long Term: Maintenance of blood pressure at goal levels.    Lipids  Yes    Intervention  Provide education and support for participant on nutrition & aerobic/resistive exercise along with prescribed medications to achieve LDL <70mg68mL >40mg.5mExpected Outcomes  Short Term: Participant states understanding of desired cholesterol values and is compliant with medications prescribed. Participant is following exercise prescription and nutrition guidelines.;Long Term: Cholesterol controlled with medications as prescribed, with individualized exercise RX and with personalized nutrition plan. Value goals: LDL < 70mg, 63m> 40 mg.        Personal Goals Discharge: Goals and Risk Factor Review    Row Name 06/29/17 1354 07/26/17 1407 08/22/17 0937         Core Components/Risk Factors/Patient Goals Review    Personal Goals Review  Weight Management/Obesity;Lipids;Hypertension  Weight Management/Obesity;Lipids;Hypertension  Weight Management/Obesity;Lipids;Hypertension     Review  Shane Shelton's vital's have been stable at cardiac rehab. Shane Shelton hSudie Baileyt had any reports of Angina during exercise  Shane Shelton's vital's have been stable at cardiac rehab. Shane Shelton hSudie Baileyt had any reports of Angina during exercise  Shane Shelton's vital's have been stable at cardiac rehab. Shane Shelton hSudie Baileyt had any reports of Angina during exercise     Expected Outcomes  Shane Shelton wSudie Baileyontinue to take his medications for HTN and Hyperlipidemia. Shane Shelton wSudie Baileyontinue to participate in exercise  Shane Shelton wSudie Baileyontinue to take his medications for HTN and Hyperlipidemia. Shane Shelton wSudie Baileyontinue to participate in exercise  Shane Shelton wSudie Baileyontinue to take his medications for HTN and Hyperlipidemia. Shane Shelton wSudie Baileyontinue to participate in exercise        Exercise Goals and Review: Exercise Goals    Row Name 06/05/17 0826             Exercise Goals   Increase Physical Activity  Yes       Intervention  Provide advice, education, support and counseling about physical activity/exercise needs.;Develop an individualized exercise prescription for aerobic and resistive training based on initial evaluation findings, risk stratification, comorbidities and participant's personal goals.       Expected Outcomes  Achievement of increased cardiorespiratory fitness and enhanced flexibility, muscular endurance and strength shown through measurements of functional capacity and personal statement of participant.       Increase Strength and Stamina  Yes       Intervention  Provide advice, education, support and counseling about physical activity/exercise needs.;Develop an individualized exercise prescription for aerobic and resistive training based on initial evaluation findings, risk stratification, comorbidities and participant's personal goals.       Expected Outcomes  Achievement of increased  cardiorespiratory fitness and enhanced flexibility, muscular endurance and strength shown through measurements of functional capacity and personal statement of participant.       Able to understand and use rate of perceived exertion (RPE) scale  Yes       Intervention  Provide education and explanation on how to use RPE scale       Expected Outcomes  Short Term: Able to use RPE daily in rehab to express subjective intensity level;Long Term:  Able to use RPE to guide intensity level when exercising independently       Knowledge and understanding of Target Heart Rate Range (THRR)  Yes       Intervention  Provide education and explanation of THRR including how the numbers were predicted and where they are located for reference       Expected Outcomes  Short Term: Able to state/look up THRR;Long Term: Able to use THRR to govern intensity when exercising independently;Short Term: Able to use daily as guideline for intensity in rehab       Able to check pulse independently  Yes       Intervention  Provide education and demonstration on how to check pulse in carotid and radial arteries.;Review the importance of being able to check your own pulse for safety during independent exercise       Expected Outcomes  Long Term: Able to check pulse independently and accurately;Short Term: Able to explain why pulse checking is important during independent exercise       Understanding of Exercise Prescription  Yes       Intervention  Provide education, explanation, and written materials on patient's individual exercise prescription       Expected Outcomes  Short Term: Able to explain program exercise prescription;Long Term: Able to explain home exercise prescription to exercise independently          Nutrition & Weight - Outcomes: Pre Biometrics - 09/03/17 1532      Pre Biometrics   Height  6' 1"  (1.854 m)    Weight  219 lb 2.2 oz (99.4 kg)    Waist Circumference  42.5 inches    Hip Circumference  40 inches     Waist to Hip Ratio  1.06 %    BMI (Calculated)  28.92    Triceps Skinfold  18 mm    % Body Fat  29.6 %    Grip Strength  47 kg    Flexibility  14.5 in    Single Leg Stand  30 seconds        Nutrition: Nutrition Therapy & Goals - 06/05/17 1447      Nutrition Therapy   Diet  Therapeutic Lifestyle Changes  Personal Nutrition Goals   Nutrition Goal  Wt loss of 1-2 lb/week to a wt loss goal of 6-15 lb at graduation from [facility].       Intervention Plan   Intervention  Prescribe, educate and counsel regarding individualized specific dietary modifications aiming towards targeted core components such as weight, hypertension, lipid management, diabetes, heart failure and other comorbidities.    Expected Outcomes  Short Term Goal: Understand basic principles of dietary content, such as calories, fat, sodium, cholesterol and nutrients.;Long Term Goal: Adherence to prescribed nutrition plan.       Nutrition Discharge: Nutrition Assessments - 09/21/17 0930      MEDFICTS Scores   Pre Score  21    Post Score  18    Score Difference  -3       Education Questionnaire Score: Knowledge Questionnaire Score - 09/05/17 1638      Knowledge Questionnaire Score   Post Score  20/24       Goals reviewed with patient; copy given to patient. Shane Shelton graduated from cardiac rehab program today with completion of 25 exercise sessions in Phase II. Pt maintained good attendance and progressed nicely during his participation in rehab as evidenced by increased MET level.   Medication list reconciled. Repeat  PHQ score-0  .  Pt has made significant lifestyle changes and should be commended for his success. Pt feels he has achieved his goals during cardiac rehab.   Pt plans to continue exercise  Doing yoga, weights and cardio 5 days a week. Merry Proud increased the distance on his post exercise walk test.Shane Shelton participated in HIT training during his participation in phase 2 cardiac rehab. We are proud of  Shane Shelton's progress! [name], RN,BSN 10/02/2017 12:19 PM

## 2017-10-21 ENCOUNTER — Encounter: Payer: Self-pay | Admitting: Interventional Cardiology

## 2017-10-22 ENCOUNTER — Other Ambulatory Visit: Payer: Self-pay | Admitting: *Deleted

## 2017-10-22 MED ORDER — CLOPIDOGREL BISULFATE 75 MG PO TABS: 75.0000 mg | ORAL_TABLET | Freq: Every day | ORAL | 3 refills | Status: DC

## 2017-11-26 ENCOUNTER — Encounter: Payer: Self-pay | Admitting: Family Medicine

## 2017-11-29 ENCOUNTER — Encounter: Payer: Self-pay | Admitting: Family Medicine

## 2017-11-29 ENCOUNTER — Ambulatory Visit (INDEPENDENT_AMBULATORY_CARE_PROVIDER_SITE_OTHER): Payer: Medicare Other | Admitting: Family Medicine

## 2017-11-29 ENCOUNTER — Other Ambulatory Visit: Payer: Self-pay

## 2017-11-29 DIAGNOSIS — Z85819 Personal history of malignant neoplasm of unspecified site of lip, oral cavity, and pharynx: Secondary | ICD-10-CM | POA: Diagnosis not present

## 2017-11-29 DIAGNOSIS — R590 Localized enlarged lymph nodes: Secondary | ICD-10-CM | POA: Diagnosis not present

## 2017-11-29 NOTE — Progress Notes (Signed)
   Subjective:    Patient ID: Shane Shelton, male    DOB: 02-10-1946, 72 y.o.   MRN: [id number]  HPI Patient has right sided neck swelling x 4-5 days.  See My Chart correspondences.  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  Was seen last year by ENT and had no evidence of recurrence. Swollen gland in right neck (oral cancer was on right side).  "Allergies are bad."  No tooth pain or sensitivity.  Today has scratchy throat.  No fever or sick contacts.    Review of Systems     Objective:   Physical Exam TMs normal.   Throat no visible lesions.  Teeth appear normal Has hight right sided ant cervical triangle node.  Not hard or fixed Lungs clear       Assessment & Plan:

## 2017-11-29 NOTE — Assessment & Plan Note (Signed)
Prior oral cancer makes any cervical adenopathy high risk.

## 2017-11-29 NOTE — Patient Instructions (Signed)
See the dentist now to make sure not at dental infection.   I am not worried by your exam, only the history. Given yourself 3-4 weeks.  If still any swelling, see Dr. Claiborne Rigg.   You don't need an annual physical with me. Do call me if you decide to get the new Shingrix vaccine.  A good, but tough vaccine.

## 2017-11-29 NOTE — Assessment & Plan Note (Signed)
Right sided.  No obvious source.   Recommend dental eval. No indication for antibiotics FU ENT in three weeks if still present.

## 2017-12-10 DIAGNOSIS — K117 Disturbances of salivary secretion: Secondary | ICD-10-CM | POA: Diagnosis not present

## 2017-12-10 DIAGNOSIS — M542 Cervicalgia: Secondary | ICD-10-CM | POA: Diagnosis not present

## 2017-12-10 DIAGNOSIS — Z85810 Personal history of malignant neoplasm of tongue: Secondary | ICD-10-CM | POA: Diagnosis not present

## 2017-12-25 ENCOUNTER — Other Ambulatory Visit: Payer: Self-pay | Admitting: Family Medicine

## 2017-12-25 DIAGNOSIS — K2100 Gastro-esophageal reflux disease with esophagitis, without bleeding: Secondary | ICD-10-CM

## 2018-01-24 DIAGNOSIS — L821 Other seborrheic keratosis: Secondary | ICD-10-CM | POA: Diagnosis not present

## 2018-01-24 DIAGNOSIS — D225 Melanocytic nevi of trunk: Secondary | ICD-10-CM | POA: Diagnosis not present

## 2018-01-24 DIAGNOSIS — L814 Other melanin hyperpigmentation: Secondary | ICD-10-CM | POA: Diagnosis not present

## 2018-01-24 DIAGNOSIS — D224 Melanocytic nevi of scalp and neck: Secondary | ICD-10-CM | POA: Diagnosis not present

## 2018-01-24 DIAGNOSIS — D2272 Melanocytic nevi of left lower limb, including hip: Secondary | ICD-10-CM | POA: Diagnosis not present

## 2018-01-24 DIAGNOSIS — Z85828 Personal history of other malignant neoplasm of skin: Secondary | ICD-10-CM | POA: Diagnosis not present

## 2018-01-24 DIAGNOSIS — D692 Other nonthrombocytopenic purpura: Secondary | ICD-10-CM | POA: Diagnosis not present

## 2018-03-19 ENCOUNTER — Encounter: Payer: Self-pay | Admitting: Interventional Cardiology

## 2018-04-01 ENCOUNTER — Other Ambulatory Visit: Payer: Self-pay | Admitting: Interventional Cardiology

## 2018-04-01 ENCOUNTER — Encounter: Payer: Self-pay | Admitting: Family Medicine

## 2018-04-02 MED ORDER — METOPROLOL SUCCINATE ER 25 MG PO TB24: 12.5000 mg | ORAL_TABLET | Freq: Every day | ORAL | 3 refills | Status: DC

## 2018-04-02 NOTE — Telephone Encounter (Signed)
Refill as requested 

## 2018-04-10 NOTE — Progress Notes (Signed)
Cardiology Office Note:    Date:  04/11/2018   ID:  Shane Shelton, DOB March 13, 1946, MRN [id number]  PCP:  [name], MD  Cardiologist:  No primary care provider on file.   Referring MD: [name], MD   Chief Complaint  Patient presents with  . Coronary Artery Disease  . Medication Problem    History of Present Illness:    Shane Shelton is a 72 y.o. male with a hx of hyperlipidemia, hypertension,  and bilateral LE claudication with negative doppler 01/2017.  Long history of CAD with PCI in 2000 04/2017 proximal RCA and RCA PL DES with placement of 4.0 x 23 mid and 2.75 x 18 [place]  for crescendo angina.  Moderate residual proximal LAD and distal RCA disease with residual angina at high levels of physical activity, none lifestyle altering.  PCI 1 year ago in the setting of crescendo angina he has done well.  He exercises 5 days a week.  With extremely high physical activity he does develop left chest tightness with radiation into the arm.  He goes away by decreasing intensity of activity or stopping exercise.  He is having no angina and typical daily activities.  He denies significant dyspnea although with high level aerobic activity dyspnea is apparent.  Symptoms are still markedly improved compared to his clinical state 1 year ago prior to the October 2018 PCI noted above.  No medication side effects.   Past Medical History:  Diagnosis Date  . Cancer [facility])    Throat cancer 2011  . Coronary artery disease   . Hyperlipidemia   . Hypertension     Past Surgical History:  Procedure Laterality Date  . CARDIAC CATHETERIZATION  05/08/2017   S/P S/P PCI, DES of Mid RCA at [facility] in [place]    Current Medications: Current Meds  Medication Sig  . aspirin EC 81 MG tablet Take 81 mg by mouth daily.  . clopidogrel (PLAVIX) 75 MG tablet Take 1 tablet (75 mg total) by mouth daily.  . Coenzyme Q-10 200 MG CAPS Take 200 mg by  mouth daily.  Marland Kitchen lisinopril (PRINIVIL,ZESTRIL) 5 MG tablet TAKE 1 TABLET BY MOUTH  DAILY  . metoprolol succinate (TOPROL-XL) 25 MG 24 hr tablet Take 0.5 tablets (12.5 mg total) by mouth daily.  . pantoprazole (PROTONIX) 40 MG tablet TAKE 1 TABLET BY MOUTH  DAILY AS NEEDED  . rosuvastatin (CRESTOR) 40 MG tablet Take 1 tablet (40 mg total) by mouth daily.     Allergies:   Gabapentin and Statins   Social History   Socioeconomic History  . Marital status: Married    Spouse name: Verdis Frederickson  . Number of children: Not on file  . Years of education: Not on file  . Highest education level: Doctorate  Occupational History  . 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 Financial resource strain: Not on file  . Food insecurity:    Worry: Not on file    Inability: Not on file  . Transportation needs:    Medical: Not on file    Non-medical: Not on file  Tobacco Use  . Smoking status: Former Smoker    Packs/day: 1.00    Years: 40.00    Pack years: 40.00    Types: Cigarettes    Last attempt to quit: 10/05/2009    Years since quitting: 8.5  . Smokeless tobacco: Never Used  Substance and Sexual Activity  .  Alcohol use: Yes    Alcohol/week: 7.0 standard drinks    Types: 7 Glasses of wine per week  . Drug use: No  . Sexual activity: Yes    Comment: monagamous  Lifestyle  . Physical activity:    Days per week: Not on file    Minutes per session: Not on file  . Stress: Not at all  Relationships  . Social connections:    Talks on phone: Not on file    Gets together: Not on file    Attends religious service: Not on file    Active member of club or organization: Not on file    Attends meetings of clubs or organizations: Not on file    Relationship status: Not on file  Other Topics Concern  . Not on file  Social History Narrative  . Not on file     Family History: The patient's family history includes Cancer in his brother and father; Diabetes in his mother;  Heart disease in his brother and mother; Hyperlipidemia in his brother and mother.  ROS:   Please see the history of present illness.    Bilateral calf discomfort with incline walking.  Angina with high intensity aerobic activity.  All other systems reviewed and are negative.  EKGs/Labs/Other Studies Reviewed:    The following studies were reviewed today: No new data.  Needs a lipid panel before the end of the year.  This will be done by Dr. Andria Frames.  EKG:  EKG is ordered today.  The ekg ordered today demonstrates sinus bradycardia.  Normal tracing otherwise.  No evidence of infarction.  Recent Labs: 07/17/2017: ALT 22  Recent Lipid Panel    Component Value Date/Time   CHOL 137 07/17/2017 0801   TRIG 95 07/17/2017 0801   HDL 45 07/17/2017 0801   CHOLHDL 3.0 07/17/2017 0801   LDLCALC 73 07/17/2017 0801   LDLDIRECT 100 (H) 04/04/2017 1438    Physical Exam:    VS:  BP 118/70   Pulse (!) 50   Ht 6\' 2"  (1.88 m)   Wt 221 lb 1.9 oz (100.3 kg)   BMI 28.39 kg/m     Wt Readings from Last 3 Encounters:  04/11/18 221 lb 1.9 oz (100.3 kg)  11/29/17 219 lb 3.2 oz (99.4 kg)  09/03/17 219 lb 2.2 oz (99.4 kg)     GEN:  Well nourished, well developed in no acute distress HEENT: Normal NECK: No JVD. LYMPHATICS: No lymphadenopathy CARDIAC: RRR, no murmur, no gallop, no edema. VASCULAR: 2+ bilateral radial pulses.  No bruits. RESPIRATORY:  Clear to auscultation without rales, wheezing or rhonchi  ABDOMEN: Soft, non-tender, non-distended, No pulsatile mass, MUSCULOSKELETAL: No deformity  SKIN: Warm and dry NEUROLOGIC:  Alert and oriented x 3 PSYCHIATRIC:  Normal affect   ASSESSMENT:    1. Coronary artery disease involving native coronary artery of native heart with angina pectoris ([place])   2. Essential hypertension   3. Left ventricular hypertrophy   4. Premature ventricular contractions    PLAN:    In order of problems listed above:  1. Stable exertional angina at high  intensity workload.  Risk mitigation with lipid management, monitoring of hemoglobin A1c, moderate intensity physical lack activity, and blood pressure control to less than 130/80 mmHg.  He is now close to 1 year out from multivessel stenting.  Will discontinue Plavix at the end of October and continue forward with aspirin 81 mg/day. 2. As noted above. 3. Good blood pressure control will help  decrease LVH. 4. Important to keep LDL less than 70.  Currently on high intensity statin therapy.  Clinical follow-up in 1 year.   Medication Adjustments/Labs and Tests Ordered: Current medicines are reviewed at length with the patient today.  Concerns regarding medicines are outlined above.  Orders Placed This Encounter  Procedures  . EKG 12-Lead   No orders of the defined types were placed in this encounter.   Patient Instructions  Medication Instructions:  1) You may discontinue Plavix on November 1st!  Labwork: None  Testing/Procedures: None  Follow-Up: Your physician wants you to follow-up in: 1 year with Dr. Tamala Julian. You will receive a reminder letter in the mail two months in advance. If you don't receive a letter, please call our office to schedule the follow-up appointment.   Any Other Special Instructions Will Be Listed Below (If Applicable).  Continue your current exercise regimen.  Please contact the office if you have recurrent angina (chest pain).    If you need a refill on your cardiac medications before your next appointment, please call your pharmacy.      Signed, Sinclair Grooms, MD  04/11/2018 10:08 AM    [place]

## 2018-04-11 ENCOUNTER — Encounter

## 2018-04-11 ENCOUNTER — Ambulatory Visit (INDEPENDENT_AMBULATORY_CARE_PROVIDER_SITE_OTHER): Payer: Medicare Other | Admitting: Interventional Cardiology

## 2018-04-11 ENCOUNTER — Encounter: Payer: Self-pay | Admitting: Interventional Cardiology

## 2018-04-11 VITALS — BP 118/70 | HR 50 | Ht 74.0 in | Wt 221.1 lb

## 2018-04-11 DIAGNOSIS — I517 Cardiomegaly: Secondary | ICD-10-CM | POA: Diagnosis not present

## 2018-04-11 DIAGNOSIS — I25119 Atherosclerotic heart disease of native coronary artery with unspecified angina pectoris: Secondary | ICD-10-CM

## 2018-04-11 DIAGNOSIS — E7800 Pure hypercholesterolemia, unspecified: Secondary | ICD-10-CM

## 2018-04-11 DIAGNOSIS — I10 Essential (primary) hypertension: Secondary | ICD-10-CM

## 2018-04-11 DIAGNOSIS — I493 Ventricular premature depolarization: Secondary | ICD-10-CM | POA: Diagnosis not present

## 2018-04-11 NOTE — Patient Instructions (Signed)
Medication Instructions:  1) You may discontinue Plavix on November 1st!  Labwork: None  Testing/Procedures: None  Follow-Up: Your physician wants you to follow-up in: 1 year with Dr. Tamala Julian. You will receive a reminder letter in the mail two months in advance. If you don't receive a letter, please call our office to schedule the follow-up appointment.   Any Other Special Instructions Will Be Listed Below (If Applicable).  Continue your current exercise regimen.  Please contact the office if you have recurrent angina (chest pain).    If you need a refill on your cardiac medications before your next appointment, please call your pharmacy.

## 2018-04-22 ENCOUNTER — Other Ambulatory Visit: Payer: Self-pay

## 2018-04-22 ENCOUNTER — Ambulatory Visit (INDEPENDENT_AMBULATORY_CARE_PROVIDER_SITE_OTHER): Payer: Medicare Other | Admitting: Family Medicine

## 2018-04-22 VITALS — BP 96/58 | HR 53 | Temp 97.9°F | Wt 219.0 lb

## 2018-04-22 DIAGNOSIS — I25119 Atherosclerotic heart disease of native coronary artery with unspecified angina pectoris: Secondary | ICD-10-CM

## 2018-04-22 DIAGNOSIS — W57XXXA Bitten or stung by nonvenomous insect and other nonvenomous arthropods, initial encounter: Secondary | ICD-10-CM | POA: Diagnosis not present

## 2018-04-22 DIAGNOSIS — S60466A Insect bite (nonvenomous) of right little finger, initial encounter: Secondary | ICD-10-CM

## 2018-04-22 DIAGNOSIS — Z23 Encounter for immunization: Secondary | ICD-10-CM | POA: Diagnosis not present

## 2018-04-22 NOTE — Assessment & Plan Note (Signed)
Little finger on right hand appears mildly swollen but otherwise normal appearing, no joint or pruritus reported. Small bite mark noted around PIP. Mild decrease in ROM due to swelling. No signs of infection. Improved in the past two weeks. Swelling likely secondary to mild allergic reaction responding well to antihistamines. Will continue to monitor patient will return to clinic on a as needed basis.

## 2018-04-22 NOTE — Progress Notes (Signed)
   Subjective:    Patient ID: Shane Shelton, male    DOB: 12/02/1945, 72 y.o.   MRN: [id number]   CC: Bug bite   HPI: Patient is a 72 yo male who presents today complaining of a bug bite on small finger in his right hand. Patient reports that he was bitten by a bug/insect two weeks ago while gardening. He initially had some swelling which caused decrease ROM and some difficulties gripping. Patient initially took some benadryl for two days. He denies any redness or drainage or warmth associate with the bite. He denies any headaches, vision change, nausea, vomiting abdominal pain or paraesthesia. Patient is not concerned but just wanted to make sure that there was not anything serious going on.  Smoking status reviewed   ROS: all other systems were reviewed and are negative other than in the HPI   Past Medical History:  Diagnosis Date  . Cancer [facility])    Throat cancer 2011  . Coronary artery disease   . Hyperlipidemia   . Hypertension     Past Surgical History:  Procedure Laterality Date  . CARDIAC CATHETERIZATION  05/08/2017   S/P S/P PCI, DES of Mid RCA at [facility] in [place]    Past medical history, surgical, family, and social history reviewed and updated in the EMR as appropriate.  Objective:  BP (!) 96/58   Pulse (!) 53   Temp 97.9 F (36.6 C) (Oral)   Wt 219 lb (99.3 kg)   SpO2 98%   BMI 28.12 kg/m   Vitals and nursing note reviewed  General: NAD, pleasant, able to participate in exam Cardiac: RRR, normal heart sounds, no murmurs. 2+ radial and PT pulses bilaterally Respiratory: CTAB, normal effort, No wheezes, rales or rhonchi Abdomen: soft, nontender, nondistended, no hepatic or splenomegaly, +BS Extremities: fifth digit right hand with mild swelling, small lesion noted at the PIP without any erythema, fluctuance/ drainage or warmth. Hand and remaining digit exam within normal limit. Skin: warm and dry, no rashes noted Neuro: alert  and oriented x4, no focal deficits Psych: Normal affect and mood   Assessment & Plan:   Insect bite of right little finger Little finger on right hand appears mildly swollen but otherwise normal appearing, no joint or pruritus reported. Small bite mark noted around PIP. Mild decrease in ROM due to swelling. No signs of infection. Improved in the past two weeks. Swelling likely secondary to mild allergic reaction responding well to antihistamines. Will continue to monitor patient will return to clinic on a as needed basis.    Marjie Skiff, MD [place] PGY-3

## 2018-04-28 ENCOUNTER — Telehealth: Payer: Self-pay | Admitting: Family Medicine

## 2018-04-28 NOTE — Telephone Encounter (Signed)
**  After Hours/ Emergency Line Call**  Received a call to report that [place] this morning.  Endorsing vomiting and diarrhea for 3 days.  Denying fevers, lightheadedness, or dizziness.  Recommended that he drink fluids with electrolytes such as Gatorade and if he is unable to keep anything down to go to the urgent care, or ED. If he is able to wait then he could be seen as a same day appointment in the office. Patient does not want anything for nausea and has been taking imodium for diarrhea.  Red flags discussed.  Will forward to PCP.  [place] Jamaia Brum, DO PGY-2, [facility] 04/28/2018 10:09 AM

## 2018-04-29 NOTE — Telephone Encounter (Signed)
Pts wife called.  States that she was told to bring pt here for fluids.  We do not have an RN in office today to start an IV to provide fluids.  Advised to go to Urgent care as they could provide fluids and probably quicker because we do not have any openings.  Wife in agreement. [place], Salome Spotted, CMA

## 2018-04-29 NOTE — Telephone Encounter (Signed)
Reviewed and agree.

## 2018-05-31 ENCOUNTER — Other Ambulatory Visit: Payer: Self-pay | Admitting: *Deleted

## 2018-06-17 ENCOUNTER — Other Ambulatory Visit: Payer: Self-pay

## 2018-06-18 DIAGNOSIS — H31092 Other chorioretinal scars, left eye: Secondary | ICD-10-CM | POA: Diagnosis not present

## 2018-06-18 DIAGNOSIS — H1851 Endothelial corneal dystrophy: Secondary | ICD-10-CM | POA: Diagnosis not present

## 2018-06-18 DIAGNOSIS — H43391 Other vitreous opacities, right eye: Secondary | ICD-10-CM | POA: Diagnosis not present

## 2018-06-18 DIAGNOSIS — H11441 Conjunctival cysts, right eye: Secondary | ICD-10-CM | POA: Diagnosis not present

## 2018-06-18 DIAGNOSIS — H2513 Age-related nuclear cataract, bilateral: Secondary | ICD-10-CM | POA: Diagnosis not present

## 2018-08-18 ENCOUNTER — Other Ambulatory Visit: Payer: Self-pay | Admitting: Interventional Cardiology

## 2018-09-03 ENCOUNTER — Other Ambulatory Visit: Payer: Self-pay | Admitting: Family Medicine

## 2018-09-03 DIAGNOSIS — E7800 Pure hypercholesterolemia, unspecified: Secondary | ICD-10-CM

## 2018-10-03 ENCOUNTER — Telehealth: Payer: Self-pay | Admitting: Interventional Cardiology

## 2018-10-03 NOTE — Telephone Encounter (Signed)
° °  Patient calling to report angina. Patient reports progressively getting worse.  Patient declined April appt. Declined APP appt.     1. Are you having CP right now? No  2. Are you experiencing any other symptoms (ex. SOB, nausea, vomiting, sweating)? SOB on exertion  3. How long have you been experiencing CP? Months, getting worse  4. Is your CP continuous or coming and going? Coming and going  5. Have you taken Nitroglycerin? no ?

## 2018-10-03 NOTE — Telephone Encounter (Signed)
Spoke with pt and offered appt today.  Pt states he wouldn't be able to make it on time.  Denies CP currently.  States he has had intermittent CP and SOB with exertion since he had stents placed but it seems to be worsening.  States vitals are fine.  Denies Nitro use and states he doesn't have any.  Scheduled pt to see Dr. Tamala Julian tomorrow and advised to go to ER if any changes.  Pt verbalized understanding and was appreciative for call.

## 2018-10-04 ENCOUNTER — Ambulatory Visit: Payer: Medicare Other | Admitting: Interventional Cardiology

## 2018-10-04 ENCOUNTER — Encounter: Payer: Self-pay | Admitting: Interventional Cardiology

## 2018-10-04 VITALS — BP 116/76 | HR 57 | Ht 74.0 in | Wt 226.4 lb

## 2018-10-04 DIAGNOSIS — I10 Essential (primary) hypertension: Secondary | ICD-10-CM | POA: Diagnosis not present

## 2018-10-04 DIAGNOSIS — I739 Peripheral vascular disease, unspecified: Secondary | ICD-10-CM

## 2018-10-04 DIAGNOSIS — I493 Ventricular premature depolarization: Secondary | ICD-10-CM

## 2018-10-04 DIAGNOSIS — I25119 Atherosclerotic heart disease of native coronary artery with unspecified angina pectoris: Secondary | ICD-10-CM | POA: Diagnosis not present

## 2018-10-04 MED ORDER — ISOSORBIDE MONONITRATE ER 30 MG PO TB24: 30.0000 mg | ORAL_TABLET | Freq: Every day | ORAL | 3 refills | Status: DC

## 2018-10-04 MED ORDER — METOPROLOL SUCCINATE ER 25 MG PO TB24: 25.0000 mg | ORAL_TABLET | Freq: Every day | ORAL | 3 refills | Status: DC

## 2018-10-04 NOTE — Patient Instructions (Signed)
Medication Instructions:  1) DISCONTINUE Lisinopril 2) INCREASE Metoprolol Succinate to 25mg  once daily 3) START Imdur 30mg  once daily  If you need a refill on your cardiac medications before your next appointment, please call your pharmacy.   Lab work: None If you have labs (blood work) drawn today and your tests are completely normal, you will receive your results only by: Marland Kitchen MyChart Message (if you have MyChart) OR . A paper copy in the mail If you have any lab test that is abnormal or we need to change your treatment, we will call you to review the results.  Testing/Procedures: None  Follow-Up: At [facility], you and your health needs are our priority.  As part of our continuing mission to provide you with exceptional heart care, we have created designated Provider Care Teams.  These Care Teams include your primary Cardiologist (physician) and Advanced Practice Providers (APPs -  Physician Assistants and Nurse Practitioners) who all work together to provide you with the care you need, when you need it. You will need a follow up appointment in 1 months.  Please call our office 2 months in advance to schedule this appointment.  You may see Dr. Tamala Julian or one of the following Advanced Practice Providers on your designated Care Team:   Truitt Merle, NP Cecilie Kicks, NP . Kathyrn Drown, NP  Any Other Special Instructions Will Be Listed Below (If Applicable).

## 2018-10-04 NOTE — Progress Notes (Signed)
Cardiology Office Note:    Date:  10/04/2018   ID:  Shane, Shelton July 05, 1946, MRN [id number]  PCP:  [name], MD  Cardiologist:  No primary care provider on file.   Referring MD: [name], MD   Chief Complaint  Patient presents with  . Coronary Artery Disease    Angina pectoris    History of Present Illness:    Shane Shelton is a 73 y.o. male with a hx of hx of hyperlipidemia, hypertension, and bilateral LE claudication with negative doppler 01/2017.  Long history of CAD with PCI in 2000, 04/2017 proximal RCA and RCA PL DES with placement of 4.0 x 23 mid and 2.75 x 18 PL XienceSierra for crescendo angina. Moderate residual proximal LAD and distal RCA disease with residual angina at high levels of physical activity, none lifestyle altering.  Since PCI in 2018, there has been background angina pectoris.  Over the past 12 months or slightly longer there has been a gradual shrinking of exertional tolerance before angina occurs.  It goes away quickly with rest.  He has had no episodes of discomfort at rest.  Residual disease noted in the RCA beyond the stents and also in the mid LAD was noted on the angiogram at the time of PCI.  He denies orthopnea, PND, tachycardia, syncope, and without limitations related to angina, claudication has not been an issue.  Past Medical History:  Diagnosis Date  . Cancer [facility])    Throat cancer 2011  . Coronary artery disease   . Hyperlipidemia   . Hypertension     Past Surgical History:  Procedure Laterality Date  . CARDIAC CATHETERIZATION  05/08/2017   S/P S/P PCI, DES of Mid RCA at [facility] in [place]    Current Medications: Current Meds  Medication Sig  . aspirin EC 81 MG tablet Take 81 mg by mouth daily.  . Coenzyme Q-10 200 MG CAPS Take 200 mg by mouth daily.  . metoprolol succinate (TOPROL-XL) 25 MG 24 hr tablet Take 0.5 tablets (12.5 mg total) by mouth daily.  . pantoprazole  (PROTONIX) 40 MG tablet TAKE 1 TABLET BY MOUTH  DAILY AS NEEDED  . rosuvastatin (CRESTOR) 40 MG tablet TAKE 1 TABLET BY MOUTH  DAILY  . [DISCONTINUED] lisinopril (PRINIVIL,ZESTRIL) 5 MG tablet TAKE 1 TABLET BY MOUTH  DAILY     Allergies:   Gabapentin and Statins   Social History   Socioeconomic History  . Marital status: Married    Spouse name: Verdis Frederickson  . Number of children: Not on file  . Years of education: Not on file  . Highest education level: Doctorate  Occupational History  . 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 Financial resource strain: Not on file  . Food insecurity:    Worry: Not on file    Inability: Not on file  . Transportation needs:    Medical: Not on file    Non-medical: Not on file  Tobacco Use  . Smoking status: Former Smoker    Packs/day: 1.00    Years: 40.00    Pack years: 40.00    Types: Cigarettes    Last attempt to quit: 10/05/2009    Years since quitting: 9.0  . Smokeless tobacco: Never Used  Substance and Sexual Activity  . Alcohol use: Yes    Alcohol/week: 7.0 standard drinks    Types: 7 Glasses of wine per week  . Drug use: No  .  Sexual activity: Yes    Comment: monagamous  Lifestyle  . Physical activity:    Days per week: Not on file    Minutes per session: Not on file  . Stress: Not at all  Relationships  . Social connections:    Talks on phone: Not on file    Gets together: Not on file    Attends religious service: Not on file    Active member of club or organization: Not on file    Attends meetings of clubs or organizations: Not on file    Relationship status: Not on file  Other Topics Concern  . Not on file  Social History Narrative  . Not on file     Family History: The patient's family history includes Cancer in his brother and father; Diabetes in his mother; Heart disease in his brother and mother; Hyperlipidemia in his brother and mother.  ROS:   Please see the history of present  illness.    Cramping in his iliocostal, hand, and calf muscles raises the question of statin induced muscle syndrome.  All other systems reviewed and are negative.  EKGs/Labs/Other Studies Reviewed:    The following studies were reviewed today: None  EKG:  EKG sinus bradycardia at 57 bpm.  And otherwise normal.  When compared to prior study from September 2019, no changes occurred.  Recent Labs: No results found for requested labs within last 8760 hours.  Recent Lipid Panel    Component Value Date/Time   CHOL 137 07/17/2017 0801   TRIG 95 07/17/2017 0801   HDL 45 07/17/2017 0801   CHOLHDL 3.0 07/17/2017 0801   LDLCALC 73 07/17/2017 0801   LDLDIRECT 100 (H) 04/04/2017 1438    Physical Exam:    VS:  BP 116/76   Pulse (!) 57   Ht 6\' 2"  (1.88 m)   Wt 226 lb 6.4 oz (102.7 kg)   SpO2 96%   BMI 29.07 kg/m     Wt Readings from Last 3 Encounters:  10/04/18 226 lb 6.4 oz (102.7 kg)  04/22/18 219 lb (99.3 kg)  04/11/18 221 lb 1.9 oz (100.3 kg)     GEN: Appears younger than stated age.  Mild abdominal obesity.. No acute distress HEENT: Normal NECK: No JVD. LYMPHATICS: No lymphadenopathy CARDIAC: RRR.  1/6 to 2/6 systolic right upper sternal border murmur, no gallop, no edema VASCULAR: 2+ bilateral carotid and radial pulses, no bruits RESPIRATORY:  Clear to auscultation without rales, wheezing or rhonchi  ABDOMEN: Soft, non-tender, non-distended, No pulsatile mass, MUSCULOSKELETAL: No deformity  SKIN: Warm and dry NEUROLOGIC:  Alert and oriented x 3 PSYCHIATRIC:  Normal affect   ASSESSMENT:    1. Coronary artery disease involving native coronary artery of native heart with angina pectoris ([place])   2. Essential hypertension   3. Premature ventricular contractions   4. Claudication [facility])    PLAN:    In order of problems listed above:  1. 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  Discontinue lisinopril.  Increase metoprolol to 25 mg/day.  Add isosorbide  mononitrate 30 mg/day and uptitrate as able to help control symptoms.  The dose of beta-blocker was previously limited because of hypotension and possibly bradycardia so we will need to be careful. 2. Target blood pressure 100 to 191 mmHg systolic with diastolics less than or equal to 80 mmHg. 3. No significant issue with this currently. 4. No complaints. 5. LDL target less than 70.  Return in 3 weeks.  Call earlier  if symptoms related to medication adjustment.  Use nitroglycerin if necessary.  General instructions concerning secondary prevention are given: LDL less than 70, hemoglobin A1c less than 7, blood pressure target less than 130/80 mmHg, >150 minutes of moderate aerobic activity per week, avoidance of smoking, weight control (via diet and exercise), and continued surveillance/management of/for obstructive sleep apnea.    Medication Adjustments/Labs and Tests Ordered: Current medicines are reviewed at length with the patient today.  Concerns regarding medicines are outlined above.  Orders Placed This Encounter  Procedures  . EKG 12-Lead   Meds ordered this encounter  Medications  . isosorbide mononitrate (IMDUR) 30 MG 24 hr tablet    Sig: Take 1 tablet (30 mg total) by mouth daily.    Dispense:  30 tablet    Refill:  3    There are no Patient Instructions on file for this visit.   Signed, Sinclair Grooms, MD  10/04/2018 10:43 AM    [place]

## 2018-10-29 ENCOUNTER — Telehealth: Payer: Self-pay | Admitting: *Deleted

## 2018-10-29 ENCOUNTER — Other Ambulatory Visit: Payer: Self-pay | Admitting: *Deleted

## 2018-10-29 MED ORDER — ISOSORBIDE MONONITRATE ER 30 MG PO TB24: 30.0000 mg | ORAL_TABLET | Freq: Every day | ORAL | 3 refills | Status: DC

## 2018-10-29 NOTE — Telephone Encounter (Signed)
Spoke with pt and he is agreeable to a virtual visit.  Advised I will have someone reach out to him to get this set up.  Pt appreciative for call.

## 2018-10-29 NOTE — Telephone Encounter (Signed)
-----   Message from Belva Crome, MD sent at 10/28/2018  1:49 PM EDT ----- Regarding: Follow-up angina office visit Needs to set up a telemedicine virtual visit.  He will need to provide blood pressure and heart rates.  I do not think he needs to come into the office.

## 2018-10-30 NOTE — Telephone Encounter (Signed)
Spoke with Shane Shelton, he is all set for his virtual appointment, Web ex works wonderfully has audio and video

## 2018-11-01 ENCOUNTER — Telehealth (INDEPENDENT_AMBULATORY_CARE_PROVIDER_SITE_OTHER): Payer: Medicare Other | Admitting: Interventional Cardiology

## 2018-11-01 ENCOUNTER — Other Ambulatory Visit: Payer: Self-pay

## 2018-11-01 ENCOUNTER — Encounter: Payer: Self-pay | Admitting: Interventional Cardiology

## 2018-11-01 VITALS — HR 58 | Ht 74.0 in | Wt 217.0 lb

## 2018-11-01 DIAGNOSIS — E7800 Pure hypercholesterolemia, unspecified: Secondary | ICD-10-CM

## 2018-11-01 DIAGNOSIS — I10 Essential (primary) hypertension: Secondary | ICD-10-CM | POA: Diagnosis not present

## 2018-11-01 DIAGNOSIS — I739 Peripheral vascular disease, unspecified: Secondary | ICD-10-CM

## 2018-11-01 DIAGNOSIS — I25119 Atherosclerotic heart disease of native coronary artery with unspecified angina pectoris: Secondary | ICD-10-CM

## 2018-11-01 MED ORDER — NITROGLYCERIN 0.4 MG SL SUBL: 0.4000 mg | SUBLINGUAL_TABLET | SUBLINGUAL | 3 refills | Status: DC | PRN

## 2018-11-01 NOTE — Progress Notes (Signed)
Virtual Visit via Video Note    Evaluation Performed:  Follow-up visit  This visit type was conducted due to national recommendations for restrictions regarding the COVID-19 Pandemic (e.g. social distancing).  This format is felt to be most appropriate for this patient at this time.  All issues noted in this document were discussed and addressed.  No physical exam was performed (except for noted visual exam findings with Video Visits).  Please refer to the patient's chart (MyChart message for video visits and phone note for telephone visits) for the patient's consent to telehealth for [facility].  Date:  11/01/2018   ID:  Shane Shelton, DOB [birth year], MRN [id number]  Patient Location:  Home  Provider location:   Office  PCP:  [name], MD  Cardiologist: Illene Labrador, III MD Electrophysiologist:  None   Chief Complaint: Angina pectoris  History of Present Illness:    Shane Shelton is a 73 y.o. male who presents via audio/video conferencing for a telehealth visit today.     He has hx of hyperlipidemia, hypertension, and bilateral LE claudication with negative doppler 01/2017.Long history of CAD with PCI in 2000, 04/2017 proximal RCA and RCA PL DES with placement of 4.0 x 23 mid and 2.75 x 18 PL XienceSierrafor crescendo angina. Moderateresidualproximal LAD and distal RCA diseasewith residual angina at high levels of physical activity, and recent lifestyle altering, as is occurring with less exertion..  Angina has improved since up titration of metoprolol succinate (25 mg) and the addition of isosorbide mononitrate (30 mg).  He has been able to walk up to 3 miles.  He is not stopping because of angina.  He has more dyspnea now than he had years ago but feels he is stable compared with the last year or 2.  He has not needed to take nitroglycerin but does not have his own prescription.  When he is use nitro in the past, it is been from his  wife's supply.  He has had no discomfort at rest.  No nitroglycerin use since starting isosorbide.  The patient does not have symptoms concerning for COVID-19 infection (fever, chills, cough, or new shortness of breath).    Prior CV studies:   The following studies were reviewed today:  No new vascular studies or imaging.  Past Medical History:  Diagnosis Date   Cancer [facility])    Throat cancer 2011   Coronary artery disease    Hyperlipidemia    Hypertension    Past Surgical History:  Procedure Laterality Date   CARDIAC CATHETERIZATION  05/08/2017   S/P S/P PCI, DES of Mid RCA at [facility] in [place]     Current Meds  Medication Sig   aspirin EC 81 MG tablet Take 81 mg by mouth daily.   Coenzyme Q-10 200 MG CAPS Take 200 mg by mouth daily.   isosorbide mononitrate (IMDUR) 30 MG 24 hr tablet Take 1 tablet (30 mg total) by mouth daily.   metoprolol succinate (TOPROL-XL) 25 MG 24 hr tablet Take 1 tablet (25 mg total) by mouth daily.   pantoprazole (PROTONIX) 40 MG tablet TAKE 1 TABLET BY MOUTH  DAILY AS NEEDED   rosuvastatin (CRESTOR) 40 MG tablet TAKE 1 TABLET BY MOUTH  DAILY     Allergies:   Gabapentin and Statins   Social History   Tobacco Use   Smoking status: Former Smoker    Packs/day: 1.00    Years: 40.00  Pack years: 40.00    Types: Cigarettes    Last attempt to quit: 10/05/2009    Years since quitting: 9.0   Smokeless tobacco: Never Used  Substance Use Topics   Alcohol use: Yes    Alcohol/week: 7.0 standard drinks    Types: 7 Glasses of wine per week   Drug use: No     Family Hx: The patient's family history includes Cancer in his brother and father; Diabetes in his mother; Heart disease in his brother and mother; Hyperlipidemia in his brother and mother.  ROS:   Please see the history of present illness.    He has not had syncope, palpitations, lower extremity edema, side effects isosorbide, or fatigue after up titration  of metoprolol.  He has had upper respiratory cough and congestion.  Both he and his wife have been sick for approximately 2 weeks and are both getting better.  His wife developed a fever and tested negative for COVID-19. All other systems reviewed and are negative.   Labs/Other Tests and Data Reviewed:    Recent Labs: No results found for requested labs within last 8760 hours.   Recent Lipid Panel Lab Results  Component Value Date/Time   CHOL 137 07/17/2017 08:01 AM   TRIG 95 07/17/2017 08:01 AM   HDL 45 07/17/2017 08:01 AM   CHOLHDL 3.0 07/17/2017 08:01 AM   LDLCALC 73 07/17/2017 08:01 AM   LDLDIRECT 100 (H) 04/04/2017 02:38 PM    Wt Readings from Last 3 Encounters:  11/01/18 217 lb (98.4 kg)  10/04/18 226 lb 6.4 oz (102.7 kg)  04/22/18 219 lb (99.3 kg)     Objective:    Vital Signs:  Pulse (!) 58    Ht 6\' 2"  (1.88 m)    Wt 217 lb (98.4 kg)    BMI 27.86 kg/m    Well nourished, well developed male in no acute distress. Appears healthy.  No respiratory distress.  No evidence of cyanosis.  No ankle edema.  ASSESSMENT & PLAN:    1. Coronary artery disease involving native coronary artery of native heart with angina pectoris ([place])   2. Essential hypertension   3. Hypercholesterolemia   4. Left leg claudication (HCC)    PLAN in order of problems:  1. A prescription for nitroglycerin will be written and instructions on use were given.  He is happy with his current clinical state, therefore further up titration of medication was not needed.  We do have quite a bit of room to upwardly adjust anti-ischemic therapy if needed.  Secondary risk prevention was discussed generally.  He is on high intensity statin therapy. 2. The patient is encouraged to purchase a digital sphygmomanometer that will also calculate heart rate.  Also encouraged to add a cardiac app to his smart phone that has the ability to track heart rate and perform heart tracings. 3. Last LDL was 73.  No adjustment is  required.  He is on atorvastatin 40 mg daily. 4. Encourage aerobic activity.  No specific complaints relative to left leg claudication.  COVID-19 Education: The signs and symptoms of COVID-19 were discussed with the patient and how to seek care for testing (follow up with PCP or arrange E-visit).  The patient has had a recent upper respiratory illness.  This also affected his wife who developed fever.  She was tested for coronavirus 19 and was negative.  He has not had fever.  He is encouraged not to assume that he had  COVID 19.  He should continue to adhere to social distancing principles..  Patient Risk:   After full review of this patient's clinical status, I feel that they are at least moderate risk at this time.  Time:   Today, I have spent 15 minutes with the patient with telehealth technology discussing angina pectoris, medication purpose and dose adjustment, and reiterating the importance of secondary prevention..     Medication Adjustments/Labs and Tests Ordered: Current medicines are reviewed at length with the patient today.  Concerns regarding medicines are outlined above.  Tests Ordered: No orders of the defined types were placed in this encounter.  Medication Changes: Meds ordered this encounter  Medications   nitroGLYCERIN (NITROSTAT) 0.4 MG SL tablet    Sig: Place 1 tablet (0.4 mg total) under the tongue every 5 (five) minutes as needed for chest pain.    Dispense:  25 tablet    Refill:  3    Disposition:  Follow up in 6 month(s)  Signed, Sinclair Grooms, MD  11/01/2018 10:23 AM    [facility]

## 2018-11-01 NOTE — Patient Instructions (Signed)
Medication Instructions:  1) The proper use and anticipated side effects of nitroglycerine has been carefully explained.  If a single episode of chest pain is not relieved by one tablet, the patient will try another within 5 minutes; and if this doesn't relieve the pain, the patient is instructed to call 911 for transportation to an emergency department.  If you need a refill on your cardiac medications before your next appointment, please call your pharmacy.   Lab work: None If you have labs (blood work) drawn today and your tests are completely normal, you will receive your results only by: Marland Kitchen MyChart Message (if you have MyChart) OR . A paper copy in the mail If you have any lab test that is abnormal or we need to change your treatment, we will call you to review the results.  Testing/Procedures: None  Follow-Up: At [facility], you and your health needs are our priority.  As part of our continuing mission to provide you with exceptional heart care, we have created designated Provider Care Teams.  These Care Teams include your primary Cardiologist (physician) and Advanced Practice Providers (APPs -  Physician Assistants and Nurse Practitioners) who all work together to provide you with the care you need, when you need it. You will need a follow up appointment in 4-6 months.  Please call our office 2 months in advance to schedule this appointment.  You may see Dr. Tamala Julian or one of the following Advanced Practice Providers on your designated Care Team:   Truitt Merle, NP Cecilie Kicks, NP . Kathyrn Drown, NP  Any Other Special Instructions Will Be Listed Below (If Applicable).

## 2018-12-05 ENCOUNTER — Other Ambulatory Visit: Payer: Self-pay

## 2018-12-05 NOTE — Patient Outreach (Signed)
[facility]) Care Management  12/05/2018  Shane Shelton 01/31/1946 [id number]   Medication Adherence call to Shane Shelton [place] Identifiers Verify patient is due on Lisinopril 5 mg  and Rosuvastatin 40 mg patient explain he is no longer taking Lisinopril and on rosuvastatin he still has medication patient will order when due. Shane Shelton is showing past due under Shane Shelton.   [place] Management Direct Dial [phone]  Fax [phone] [email or web address]

## 2019-01-16 DIAGNOSIS — L821 Other seborrheic keratosis: Secondary | ICD-10-CM | POA: Diagnosis not present

## 2019-01-16 DIAGNOSIS — L918 Other hypertrophic disorders of the skin: Secondary | ICD-10-CM | POA: Diagnosis not present

## 2019-01-16 DIAGNOSIS — Z85828 Personal history of other malignant neoplasm of skin: Secondary | ICD-10-CM | POA: Diagnosis not present

## 2019-01-16 DIAGNOSIS — D225 Melanocytic nevi of trunk: Secondary | ICD-10-CM | POA: Diagnosis not present

## 2019-01-16 DIAGNOSIS — D2272 Melanocytic nevi of left lower limb, including hip: Secondary | ICD-10-CM | POA: Diagnosis not present

## 2019-03-27 ENCOUNTER — Encounter: Payer: Self-pay | Admitting: Family Medicine

## 2019-03-27 ENCOUNTER — Other Ambulatory Visit: Payer: Self-pay

## 2019-03-27 ENCOUNTER — Ambulatory Visit (INDEPENDENT_AMBULATORY_CARE_PROVIDER_SITE_OTHER): Payer: Medicare Other | Admitting: Family Medicine

## 2019-03-27 DIAGNOSIS — D122 Benign neoplasm of ascending colon: Secondary | ICD-10-CM

## 2019-03-27 DIAGNOSIS — Z0000 Encounter for general adult medical examination without abnormal findings: Secondary | ICD-10-CM

## 2019-03-27 DIAGNOSIS — Z87891 Personal history of nicotine dependence: Secondary | ICD-10-CM | POA: Diagnosis not present

## 2019-03-27 DIAGNOSIS — Z122 Encounter for screening for malignant neoplasm of respiratory organs: Secondary | ICD-10-CM

## 2019-03-27 DIAGNOSIS — Z23 Encounter for immunization: Secondary | ICD-10-CM

## 2019-03-27 DIAGNOSIS — I25119 Atherosclerotic heart disease of native coronary artery with unspecified angina pectoris: Secondary | ICD-10-CM | POA: Diagnosis not present

## 2019-03-27 DIAGNOSIS — I10 Essential (primary) hypertension: Secondary | ICD-10-CM

## 2019-03-27 NOTE — Progress Notes (Signed)
Date of Visit: 03/27/2019   CC: Annual physical   HPI:  Shane Shelton is a 73 year old pleasant male with past history of CAD and essential HTN presenting today with right 2nd and 5th PIP joint pain and pain with left wrist extension. Reports noticing right joint pains this summer. Pain intially started with 5th PIP joint then progressed to 2nd PIP. Relived with Voltran gel. Non-radiating pain with no aggressors.  3 week history of wrist pain that is more prominent with extension and is relieved with Voltran gel. Can't recall doing anything out of his ordinary regimen that could have irritated his joints. Patient thinks the pain could be related to downward dog in yoga, no other activities that could strain the wrist.   ROS:  ROS Constitution: No unintentional weight loss.  HEENT: No sore throat, HA, or rhinorrhea.  Cardiovascular: Some angina with exertion.  Pulmonary: No SOB.  GI: No abdominal pain, regular BM. GU: No changes with urination.  MSK: Some joint pain but no myalgia. Skin: No new skin lesions.   [place]:  Past Medical History:  Diagnosis Date  . Cancer [facility])    Throat cancer 2011  . Coronary artery disease   . Hyperlipidemia   . Hypertension    Current Outpatient Medications on File Prior to Visit  Medication Sig Dispense Refill  . aspirin EC 81 MG tablet Take 81 mg by mouth daily.    . Coenzyme Q-10 200 MG CAPS Take 200 mg by mouth daily.    . isosorbide mononitrate (IMDUR) 30 MG 24 hr tablet Take 1 tablet (30 mg total) by mouth daily. 90 tablet 3  . metoprolol succinate (TOPROL-XL) 25 MG 24 hr tablet Take 1 tablet (25 mg total) by mouth daily. 90 tablet 3  . pantoprazole (PROTONIX) 40 MG tablet TAKE 1 TABLET BY MOUTH  DAILY AS NEEDED 90 tablet 3  . rosuvastatin (CRESTOR) 40 MG tablet TAKE 1 TABLET BY MOUTH  DAILY 90 tablet 2  . nitroGLYCERIN (NITROSTAT) 0.4 MG SL tablet Place 1 tablet (0.4 mg total) under the tongue every 5 (five) minutes as needed for chest  pain. 25 tablet 3   No current facility-administered medications on file prior to visit.    Past Surgical History:  Procedure Laterality Date  . CARDIAC CATHETERIZATION  05/08/2017   S/P S/P PCI, DES of Mid RCA at [facility] in [place]   Family History  Problem Relation Age of Onset  . Heart disease Mother   . Diabetes Mother   . Hyperlipidemia Mother   . Cancer Father   . Heart disease Brother   . Cancer Brother   . Hyperlipidemia Brother     PHYSICAL EXAM: BP (!) 112/56   Pulse (!) 55   Wt 219 lb 6.4 oz (99.5 kg)   SpO2 96%   BMI 28.17 kg/m  Gen: Well- appearing male in no acute distress  HEENT: Normocephalic head with no cervical LAD. PERRL. MMM.  Heart: S1S2 RRR with out m/r/g. Good capillary refill.  Lungs: CTAB. No wheezing. Good work of breathing.  Abd: soft, non distended with normoactive bowel sounds. Ext: No lower extremity edema. MSK: 2nd and 5th right PIP joint swollen and tender to flexion. No tenderness to palpation over the joints. No erythema, swelling or warmth over joints. Left wrist with out skin changes or tenderness to palpation. Good grip strength bilaterally. Good range of motion but some tenderness to extension of wrist.   ASSESSMENT/PLAN:  Joint pain: ~2-3 month history  of PIP joint pain relived with Voltran gel. Likely osteoarthritis but differential also include RA based on joint involvement. RA less likely since pain not bilateral and relived with Voltran gel. Continue to use gel and acetaminophen or ibuprofen as needed for pain.. Could do further work up with rheumatoid factor and anti-CCP if worried about RA.  Follow up if pain no longer controlled with OTC medications.   Health maintenance:  Colonoscopy: referal to GI Flu vaccine: received 03/27/2019 Annual blood work: 2 year history since last blood work. Routine CBC, CMP and Lipid panel today.  Shingrix vaccine: some interest but would like to think it over  HTN:  Well  controlled. BP at 112/56 today.   CAD: Stable. No recent chest pain or trouble breathing. Secondary MI prevention with once daily aspirin.   FOLLOW UP: Follow up as needed.   Vernice Jefferson MS3, Medical Student

## 2019-03-27 NOTE — Patient Instructions (Addendum)
You look marvelous The wrist and finger stuff is likely old age.  Tendonitis and osteoarthritis.  Voltarin gel fine.  I will call with lab test results. Look up Shingrix vaccine.  You and your wife can let me know if you want it.  2 doses.  Significant side effects.  I would call into pharmacy.

## 2019-03-28 DIAGNOSIS — Z0000 Encounter for general adult medical examination without abnormal findings: Secondary | ICD-10-CM | POA: Insufficient documentation

## 2019-03-28 DIAGNOSIS — Z87891 Personal history of nicotine dependence: Secondary | ICD-10-CM | POA: Insufficient documentation

## 2019-03-28 LAB — LIPID PANEL
Chol/HDL Ratio: 2.8 ratio (ref 0.0–5.0)
Cholesterol, Total: 143 mg/dL (ref 100–199)
HDL: 51 mg/dL (ref >39)
LDL Calculated: 77 mg/dL (ref 0–99)
Triglycerides: 76 mg/dL (ref 0–149)
VLDL Cholesterol Cal: 15 mg/dL (ref 5–40)

## 2019-03-28 LAB — CBC
Hematocrit: 42.8 % (ref 37.5–51.0)
Hemoglobin: 14 g/dL (ref 13.0–17.7)
MCH: 30 pg (ref 26.6–33.0)
MCHC: 32.7 g/dL (ref 31.5–35.7)
MCV: 92 fL (ref 79–97)
Platelets: 190 10*3/uL (ref 150–450)
RBC: 4.67 x10E6/uL (ref 4.14–5.80)
RDW: 13.4 % (ref 11.6–15.4)
WBC: 3.6 10*3/uL (ref 3.4–10.8)

## 2019-03-28 LAB — CMP14+EGFR
ALT: 18 IU/L (ref 0–44)
AST: 18 IU/L (ref 0–40)
Albumin/Globulin Ratio: 2.4 — ABNORMAL HIGH (ref 1.2–2.2)
Albumin: 4.6 g/dL (ref 3.7–4.7)
Alkaline Phosphatase: 39 IU/L (ref 39–117)
BUN/Creatinine Ratio: 16 (ref 10–24)
BUN: 18 mg/dL (ref 8–27)
Bilirubin Total: 0.4 mg/dL (ref 0.0–1.2)
CO2: 25 mmol/L (ref 20–29)
Calcium: 9.3 mg/dL (ref 8.6–10.2)
Chloride: 101 mmol/L (ref 96–106)
Creatinine, Ser: 1.1 mg/dL (ref 0.76–1.27)
GFR calc Af Amer: 78 mL/min/{1.73_m2} (ref >59)
GFR calc non Af Amer: 67 mL/min/{1.73_m2} (ref >59)
Globulin, Total: 1.9 g/dL (ref 1.5–4.5)
Glucose: 125 mg/dL — ABNORMAL HIGH (ref 65–99)
Potassium: 4.3 mmol/L (ref 3.5–5.2)
Sodium: 140 mmol/L (ref 134–144)
Total Protein: 6.5 g/dL (ref 6.0–8.5)

## 2019-03-28 NOTE — Assessment & Plan Note (Signed)
Meets criteria for annual low dose CT screen

## 2019-03-28 NOTE — Assessment & Plan Note (Signed)
Well controled on current meds 

## 2019-03-28 NOTE — Assessment & Plan Note (Signed)
No chest pain.  Followed by cards.  Check lipids today.  No change in meds.

## 2019-03-28 NOTE — Assessment & Plan Note (Signed)
Healthy male with no at risk behaviors.  Flu shot and consider shingrix.

## 2019-04-01 NOTE — Progress Notes (Signed)
Cardiology Office Note:    Date:  04/02/2019   ID:  Eathon, Eveland 1945-09-25, MRN [id number]  PCP:  [name], MD  Cardiologist:  No primary care provider on file.   Referring MD: [name], MD   Chief Complaint  Patient presents with   Coronary Artery Disease    History of Present Illness:    Islam Dellacroce is a 73 y.o. male with a hx of hyperlipidemia, hypertension, and bilateral LE claudication with negative doppler 01/2017.Long history of CAD with PCI in 2000,04/2017 proximal RCA and RCA PL DES with placement of 4.0 x 23 mid and 2.75 x 18 PL XienceSierrafor crescendo angina. Moderateresidualproximal LAD and distal RCA diseasewith residual angina.   :Has had no angina.  He is walking 2 to 3 miles per day.  He denies claudication.  He has been dedicated with his increase in physical activity.  He denies nitroglycerin use.  No medication side effects.  Past Medical History:  Diagnosis Date   Cancer [facility])    Throat cancer 2011   Coronary artery disease    Hyperlipidemia    Hypertension     Past Surgical History:  Procedure Laterality Date   CARDIAC CATHETERIZATION  05/08/2017   S/P S/P PCI, DES of Mid RCA at [facility] in [place]    Current Medications: Current Meds  Medication Sig   aspirin EC 81 MG tablet Take 81 mg by mouth daily.   Coenzyme Q-10 200 MG CAPS Take 200 mg by mouth daily.   isosorbide mononitrate (IMDUR) 30 MG 24 hr tablet Take 1 tablet (30 mg total) by mouth daily.   metoprolol succinate (TOPROL-XL) 25 MG 24 hr tablet Take 1 tablet (25 mg total) by mouth daily.   nitroGLYCERIN (NITROSTAT) 0.4 MG SL tablet Place 1 tablet (0.4 mg total) under the tongue every 5 (five) minutes as needed for chest pain.   pantoprazole (PROTONIX) 40 MG tablet Take 40 mg by mouth daily.   rosuvastatin (CRESTOR) 40 MG tablet TAKE 1 TABLET BY MOUTH  DAILY     Allergies:   Gabapentin and Statins    Social History   Socioeconomic History   Marital status: Married    Spouse name: Verdis Frederickson   Number of children: Not on file   Years of education: Not on file   Highest education level: Doctorate  Occupational History   Occupation: Adult nurse    Comment: Retired  Scientist, product/process development strain: Not on file   Food insecurity    Worry: Not on file    Inability: Not on Lexicographer needs    Medical: Not on file    Non-medical: Not on file  Tobacco Use   Smoking status: Former Smoker    Packs/day: 1.00    Years: 40.00    Pack years: 40.00    Types: Cigarettes    Quit date: 10/05/2009    Years since quitting: 9.4   Smokeless tobacco: Never Used  Substance and Sexual Activity   Alcohol use: Yes    Alcohol/week: 7.0 standard drinks    Types: 7 Glasses of wine per week   Drug use: No   Sexual activity: Yes    Comment: monagamous  Lifestyle   Physical activity    Days per week: Not on file    Minutes per session: Not on file   Stress: Not at all  Relationships   Social connections    Talks on phone:  Not on file    Gets together: Not on file    Attends religious service: Not on file    Active member of club or organization: Not on file    Attends meetings of clubs or organizations: Not on file    Relationship status: Not on file  Other Topics Concern   Not on file  Social History Narrative   Not on file     Family History: The patient's family history includes Cancer in his brother and father; Diabetes in his mother; Heart disease in his brother and mother; Hyperlipidemia in his brother and mother.  ROS:   Please see the history of present illness.    No complaints all other systems reviewed and are negative.  EKGs/Labs/Other Studies Reviewed:    The following studies were reviewed today: No new data  EKG:  EKG not repeated  Recent Labs: 03/27/2019: ALT 18; BUN 18; Creatinine, Ser 1.10; Hemoglobin 14.0; Platelets  190; Potassium 4.3; Sodium 140  Recent Lipid Panel    Component Value Date/Time   CHOL 143 03/27/2019 1039   TRIG 76 03/27/2019 1039   HDL 51 03/27/2019 1039   CHOLHDL 2.8 03/27/2019 1039   LDLCALC 77 03/27/2019 1039   LDLDIRECT 100 (H) 04/04/2017 1438    Physical Exam:    VS:  BP 118/64    Pulse (!) 58    Ht 6\' 2"  (1.88 m)    Wt 223 lb 12.8 oz (101.5 kg)    SpO2 96%    BMI 28.73 kg/m     Wt Readings from Last 3 Encounters:  04/02/19 223 lb 12.8 oz (101.5 kg)  03/27/19 219 lb 6.4 oz (99.5 kg)  11/01/18 217 lb (98.4 kg)     GEN: Slight increase in weight. No acute distress HEENT: Normal NECK: No JVD. LYMPHATICS: No lymphadenopathy CARDIAC:  RRR without murmur, gallop, or edema. VASCULAR:  Normal Pulses. No bruits. RESPIRATORY:  Clear to auscultation without rales, wheezing or rhonchi  ABDOMEN: Soft, non-tender, non-distended, No pulsatile mass, MUSCULOSKELETAL: No deformity  SKIN: Warm and dry NEUROLOGIC:  Alert and oriented x 3 PSYCHIATRIC:  Normal affect   ASSESSMENT:    1. Coronary artery disease involving native coronary artery of native heart with angina pectoris ([place])   2. Essential hypertension   3. Left leg claudication ([place])   4. Hypercholesterolemia   5. Educated About Covid-19 Virus Infection    PLAN:    In order of problems listed above:  1. Secondary prevention reiterated 2. Target blood pressure less than 130/80 mmHg. 3. Continue exercise to control claudication.  Currently no complaints. 4. Target LDL less than 70 5. Masking, washing, and social distancing is advocated.  Overall education and awareness concerning primary/secondary risk prevention was discussed in detail: LDL less than 70, hemoglobin A1c less than 7, blood pressure target less than 130/80 mmHg, >150 minutes of moderate aerobic activity per week, avoidance of smoking, weight control (via diet and exercise), and continued surveillance/management of/for obstructive sleep  apnea.    Medication Adjustments/Labs and Tests Ordered: Current medicines are reviewed at length with the patient today.  Concerns regarding medicines are outlined above.  No orders of the defined types were placed in this encounter.  No orders of the defined types were placed in this encounter.   Patient Instructions  Medication Instructions:  Your physician recommends that you continue on your current medications as directed. Please refer to the Current Medication list given to you today.  If you need a  refill on your cardiac medications before your next appointment, please call your pharmacy.   Lab work: None If you have labs (blood work) drawn today and your tests are completely normal, you will receive your results only by:  [place] (if you have MyChart) OR  A paper copy in the mail If you have any lab test that is abnormal or we need to change your treatment, we will call you to review the results.  Testing/Procedures: None  Follow-Up: At [facility], you and your health needs are our priority.  As part of our continuing mission to provide you with exceptional heart care, we have created designated Provider Care Teams.  These Care Teams include your primary Cardiologist (physician) and Advanced Practice Providers (APPs -  Physician Assistants and Nurse Practitioners) who all work together to provide you with the care you need, when you need it. You will need a follow up appointment in 9-12 months.  Please call our office 2 months in advance to schedule this appointment.  You may see Dr. Tamala Julian or one of the following Advanced Practice Providers on your designated Care Team:   Truitt Merle, NP Cecilie Kicks, NP  Kathyrn Drown, NP  Any Other Special Instructions Will Be Listed Below (If Applicable).       Signed, Sinclair Grooms, MD  04/02/2019 10:19 AM    [place]

## 2019-04-02 ENCOUNTER — Encounter: Payer: Self-pay | Admitting: Interventional Cardiology

## 2019-04-02 ENCOUNTER — Other Ambulatory Visit: Payer: Self-pay

## 2019-04-02 ENCOUNTER — Ambulatory Visit (INDEPENDENT_AMBULATORY_CARE_PROVIDER_SITE_OTHER): Payer: Medicare Other | Admitting: Interventional Cardiology

## 2019-04-02 VITALS — BP 118/64 | HR 58 | Ht 74.0 in | Wt 223.8 lb

## 2019-04-02 DIAGNOSIS — Z7189 Other specified counseling: Secondary | ICD-10-CM | POA: Diagnosis not present

## 2019-04-02 DIAGNOSIS — I739 Peripheral vascular disease, unspecified: Secondary | ICD-10-CM | POA: Diagnosis not present

## 2019-04-02 DIAGNOSIS — I25119 Atherosclerotic heart disease of native coronary artery with unspecified angina pectoris: Secondary | ICD-10-CM

## 2019-04-02 DIAGNOSIS — I10 Essential (primary) hypertension: Secondary | ICD-10-CM

## 2019-04-02 DIAGNOSIS — E7800 Pure hypercholesterolemia, unspecified: Secondary | ICD-10-CM

## 2019-04-02 NOTE — Patient Instructions (Signed)
Medication Instructions:  Your physician recommends that you continue on your current medications as directed. Please refer to the Current Medication list given to you today.  If you need a refill on your cardiac medications before your next appointment, please call your pharmacy.   Lab work: None If you have labs (blood work) drawn today and your tests are completely normal, you will receive your results only by: . MyChart Message (if you have MyChart) OR . A paper copy in the mail If you have any lab test that is abnormal or we need to change your treatment, we will call you to review the results.  Testing/Procedures: None  Follow-Up: At [facility], you and your health needs are our priority.  As part of our continuing mission to provide you with exceptional heart care, we have created designated Provider Care Teams.  These Care Teams include your primary Cardiologist (physician) and Advanced Practice Providers (APPs -  Physician Assistants and Nurse Practitioners) who all work together to provide you with the care you need, when you need it. You will need a follow up appointment in 9-12 months.  Please call our office 2 months in advance to schedule this appointment.  You may see Dr. Smith or one of the following Advanced Practice Providers on your designated Care Team:   Lori Gerhardt, NP Laura Ingold, NP . Jill McDaniel, NP  Any Other Special Instructions Will Be Listed Below (If Applicable).    

## 2019-04-06 ENCOUNTER — Encounter: Payer: Self-pay | Admitting: Family Medicine

## 2019-04-06 DIAGNOSIS — R918 Other nonspecific abnormal finding of lung field: Secondary | ICD-10-CM

## 2019-04-14 ENCOUNTER — Telehealth: Payer: Self-pay | Admitting: Internal Medicine

## 2019-04-14 NOTE — Telephone Encounter (Signed)
Hi Dr. Carlean Purl. We have received a referral from pt's PCP for a repeat colon, Pt had colon in 2015. We have received his previous GI records; they will be placed on your desk for review. Please advise on scheduling. Thank you.

## 2019-04-15 ENCOUNTER — Other Ambulatory Visit: Payer: Self-pay | Admitting: Interventional Cardiology

## 2019-04-15 ENCOUNTER — Encounter: Payer: Self-pay | Admitting: Internal Medicine

## 2019-04-15 NOTE — Telephone Encounter (Signed)
Chart reviewed  Old records reviewed  Based upon pathology review and new guidelines that came out since his last colonoscopy in 2015  patient can wait until 10/2020 to repeat a colonoscopy  If he has any ? About this I can call him but new guidelines indicate 7-10 years for next exam  Place 10/2020 colonoscopy recall please

## 2019-04-15 NOTE — Telephone Encounter (Signed)
[facility] mail order pharmacy is requesting a refill on pantoprazole. Would Dr. Tamala Julian like to refill this medication? Please address

## 2019-04-15 NOTE — Telephone Encounter (Signed)
On 04/02/2019 pt reported he was not taking.  If he does need refilled, will need to get from PCP.

## 2019-04-15 NOTE — Telephone Encounter (Signed)
I called patient and informed him OK until 10/2020.  This was good news for him.

## 2019-04-16 ENCOUNTER — Other Ambulatory Visit: Payer: Self-pay | Admitting: Family Medicine

## 2019-04-16 ENCOUNTER — Encounter: Payer: Self-pay | Admitting: Family Medicine

## 2019-04-16 ENCOUNTER — Other Ambulatory Visit: Payer: Self-pay | Admitting: Interventional Cardiology

## 2019-04-16 NOTE — Telephone Encounter (Signed)
I addressed this yesterday.  Pt reported at Sept appt that he was not taking this medication.  If he is taking it, will need to get from PCP.

## 2019-04-16 NOTE — Telephone Encounter (Signed)
Recall entered. records will be placed in file cabinet.

## 2019-04-16 NOTE — Telephone Encounter (Signed)
Pt's pharmacy requesting a refill on pantoprazole. Would Dr. Tamala Julian like to refill this medication? please address

## 2019-04-17 ENCOUNTER — Other Ambulatory Visit: Payer: Self-pay

## 2019-04-17 NOTE — Patient Outreach (Signed)
[facility]) Care Management  04/17/2019  Sadat Yarter 1945/11/22 [id number]   Medication Adherence call to Mr. Dugan Wachtler Hippa Verify spoke with patient he is past due on Lisinopril 5 mg patient explain he is no longer taking this medication Doctor took him.Mr. Melly is showing past due under [place].   [facility] Management Direct Dial [phone]  Fax [phone] [email or web address]

## 2019-04-23 ENCOUNTER — Ambulatory Visit
Admission: RE | Admit: 2019-04-23 | Discharge: 2019-04-23 | Disposition: A | Discharge status: Home / Self Care | Payer: Medicare Other | Source: Ambulatory Visit | Attending: Family Medicine | Admitting: Family Medicine

## 2019-04-23 DIAGNOSIS — J439 Emphysema, unspecified: Secondary | ICD-10-CM | POA: Diagnosis not present

## 2019-04-23 DIAGNOSIS — R918 Other nonspecific abnormal finding of lung field: Secondary | ICD-10-CM

## 2019-06-30 DIAGNOSIS — H43391 Other vitreous opacities, right eye: Secondary | ICD-10-CM | POA: Diagnosis not present

## 2019-06-30 DIAGNOSIS — H11441 Conjunctival cysts, right eye: Secondary | ICD-10-CM | POA: Diagnosis not present

## 2019-06-30 DIAGNOSIS — H18513 Endothelial corneal dystrophy, bilateral: Secondary | ICD-10-CM | POA: Diagnosis not present

## 2019-06-30 DIAGNOSIS — H2513 Age-related nuclear cataract, bilateral: Secondary | ICD-10-CM | POA: Diagnosis not present

## 2019-06-30 DIAGNOSIS — H31092 Other chorioretinal scars, left eye: Secondary | ICD-10-CM | POA: Diagnosis not present

## 2019-08-04 ENCOUNTER — Telehealth: Payer: Self-pay

## 2019-08-04 NOTE — Telephone Encounter (Signed)
I spoke to the patient who has been experiencing CP and SOB with exertion.  I scheduled him with Mickel Baas on 1/7 @ 9:30, but was told that if symptoms worsen, he should go to the ED.  He verbalized understanding.

## 2019-08-07 ENCOUNTER — Encounter: Payer: Self-pay | Admitting: Family Medicine

## 2019-08-07 ENCOUNTER — Ambulatory Visit: Payer: Medicare Other | Admitting: Cardiology

## 2019-08-07 ENCOUNTER — Other Ambulatory Visit: Payer: Self-pay

## 2019-08-07 ENCOUNTER — Encounter: Payer: Self-pay | Admitting: Cardiology

## 2019-08-07 VITALS — BP 130/70 | HR 50 | Ht 74.0 in | Wt 230.0 lb

## 2019-08-07 DIAGNOSIS — I10 Essential (primary) hypertension: Secondary | ICD-10-CM

## 2019-08-07 DIAGNOSIS — R079 Chest pain, unspecified: Secondary | ICD-10-CM

## 2019-08-07 DIAGNOSIS — R001 Bradycardia, unspecified: Secondary | ICD-10-CM

## 2019-08-07 DIAGNOSIS — I25119 Atherosclerotic heart disease of native coronary artery with unspecified angina pectoris: Secondary | ICD-10-CM | POA: Diagnosis not present

## 2019-08-07 DIAGNOSIS — E7800 Pure hypercholesterolemia, unspecified: Secondary | ICD-10-CM | POA: Diagnosis not present

## 2019-08-07 MED ORDER — ISOSORBIDE MONONITRATE ER 30 MG PO TB24: 30.0000 mg | ORAL_TABLET | ORAL | 6 refills | Status: DC

## 2019-08-07 NOTE — Progress Notes (Signed)
Cardiology Office Note   Date:  08/08/2019   ID:  Arseniy, Losacco [birth year], MRN [id number]  PCP:  [name], MD  Cardiologist:  Dr. Tamala Julian     Chief Complaint  Patient presents with  . Chest Pain      History of Present Illness: Shane Shelton is a 74 y.o. male who presents for Chest pain and SOB  last seen by Dr. Tamala Julian 04/02/19 at that time he was stable without complaints walking 2-3 miles per day. Marland Kitchen   He has a hx of hyperlipidemia, hypertension, and bilateral LE claudication with negative doppler 01/2017.Long history of CAD with PCI in 2000, on way back form [place], for vacation in 04/2017 he had severe angiana and had cath in [place].with proximal RCA and RCA PL DES with placement of 4.0 x 23 mid and 2.75 x 18 PL XienceSierra.. Moderateresidualproximal LAD and distal RCA diseasewith residual angina.   Goals:  LDL less than 70, hemoglobin A1c less than 7, blood pressure target less than 130/80 mmHg, >150 minutes of moderate aerobic activity per week, avoidance of smoking, weight control (via diet and exercise), and continued surveillance/management of/for obstructive sleep apnea.  Today he called to be seen for chest pain, SOB.  Began 08/01/19, began with walking, during his 4 mile walks, now occurs when going to [place] in night, he has tried NTG once and went back to sleep so not sure if pain resolved due to NTG.  Does go down Lt arm somewhat.  No nausea or diaphoresis and SOB is mild.    Past Medical History:  Diagnosis Date  . Cancer [facility])    Throat cancer 2011  . Coronary artery disease   . Hyperlipidemia   . Hypertension     Past Surgical History:  Procedure Laterality Date  . CARDIAC CATHETERIZATION  05/08/2017   S/P S/P PCI, DES of Mid RCA at [facility] in [place]  . COLONOSCOPY     multiple  . ESOPHAGOGASTRODUODENOSCOPY     multiple  . ESOPHAGOSCOPY W/ PERCUTANEOUS GASTROSTOMY TUBE PLACEMENT  2011     Current  Outpatient Medications  Medication Sig Dispense Refill  . aspirin EC 81 MG tablet Take 81 mg by mouth daily.    . Coenzyme Q-10 200 MG CAPS Take 200 mg by mouth daily.    . isosorbide mononitrate (IMDUR) 30 MG 24 hr tablet Take 1-2 tablets (30-60 mg total) by mouth as directed. Take 2 tablets by mouth once a day alternating with 1 tablet by mouth once a day 90 tablet 6  . metoprolol succinate (TOPROL-XL) 25 MG 24 hr tablet Take 1 tablet (25 mg total) by mouth daily. 90 tablet 3  . nitroGLYCERIN (NITROSTAT) 0.4 MG SL tablet Place 1 tablet (0.4 mg total) under the tongue every 5 (five) minutes as needed for chest pain. 25 tablet 3  . pantoprazole (PROTONIX) 40 MG tablet TAKE 1 TABLET BY MOUTH  DAILY AS NEEDED 90 tablet 3  . rosuvastatin (CRESTOR) 40 MG tablet TAKE 1 TABLET BY MOUTH  DAILY 90 tablet 3   No current facility-administered medications for this visit.    Allergies:   Ambien [zolpidem], Gabapentin, and Statins    Social History:  The patient  reports that he quit smoking about 9 years ago. His smoking use included cigarettes. He has a 40.00 pack-year smoking history. He has never used smokeless tobacco. He reports current alcohol use of about 7.0 standard drinks of alcohol per week. He  reports that he does not use drugs.   Family History:  The patient's family history includes Cancer in his brother and father; Diabetes in his mother; Heart disease in his brother and mother; Hyperlipidemia in his brother and mother.    ROS:  General:no colds or fevers, no weight changes Skin:no rashes or ulcers HEENT:no blurred vision, no congestion CV:see HPI PUL:see HPI GI:no diarrhea constipation or melena, no indigestion GU:no hematuria, no dysuria MS:no joint pain, no claudication Neuro:no syncope, no lightheadedness Endo:no diabetes, no thyroid disease  Wt Readings from Last 3 Encounters:  08/07/19 230 lb (104.3 kg)  04/02/19 223 lb 12.8 oz (101.5 kg)  03/27/19 219 lb 6.4 oz (99.5 kg)       PHYSICAL EXAM: VS:  BP 130/70   Pulse (!) 50   Ht 6\' 2"  (1.88 m)   Wt 230 lb (104.3 kg)   SpO2 97%   BMI 29.53 kg/m  , BMI Body mass index is 29.53 kg/m. General:Pleasant affect, NAD Skin:Warm and dry, brisk capillary refill HEENT:normocephalic, sclera clear, mucus membranes moist Neck:supple, no JVD, no bruits  Heart:S1S2 RRR without murmur, gallup, rub or click Lungs:clear without rales, rhonchi, or wheezes [id number], non tender, + BS, do not palpate liver spleen or masses Ext:no lower ext edema, 2+ pedal pulses, 2+ radial pulses Neuro:alert and oriented X 3, MAE, follows commands, + facial symmetry    EKG:  EKG is ordered today. The ekg ordered today demonstrates SB at 45, rate slower than last EKG but similar to others, no EKG ST changes.     Recent Labs: 03/27/2019: ALT 18; BUN 18; Creatinine, Ser 1.10; Hemoglobin 14.0; Platelets 190; Potassium 4.3; Sodium 140    Lipid Panel    Component Value Date/Time   CHOL 143 03/27/2019 1039   TRIG 76 03/27/2019 1039   HDL 51 03/27/2019 1039   CHOLHDL 2.8 03/27/2019 1039   LDLCALC 77 03/27/2019 1039   LDLDIRECT 100 (H) 04/04/2017 1438       Other studies Reviewed: Additional studies/ records that were reviewed today include: see HPI.   ASSESSMENT AND PLAN:  1.  Increasing angina, discussed with Dr. Tamala Julian, will do lexiscan myoview to eval which vessel causing ischemia, but if neg for ischemia and pain continues would recommend cath, he had normal nuc prior to last stent.  I increased imdur to 30 mg alternating with 60, wanted to try 45 mg daily but he did not believe he could break in to two.  Will see back after scan for further plan  If pain increases to go to ER.  2. CAD with stents, last 2018.  In [place].  Results listed under media.   3.  HTN controlled continue meds.  4.  HLD on statin.  5.  Sinus brady at 45, no symptoms associated and his average is 45 to 57.      Current medicines are reviewed with the  patient today.  The patient Has no concerns regarding medicines.  The following changes have been made:  See above Labs/ tests ordered today include:see above  Disposition:   FU:  see above  Signed, Cecilie Kicks, NP  08/08/2019 8:32 AM    [address], [place], [place], [place] Phone: [phone]; Fax: [phone]

## 2019-08-07 NOTE — Patient Instructions (Addendum)
Medication Instructions:   Your physician has recommended you make the following change in your medication:   1) Increase your Imdur to 40-60MG ; Take 2 tablets by mouth once a day alternating with 1 tablet by mouth once a day  *If you need a refill on your cardiac medications before your next appointment, please call your pharmacy*  Lab Work:  None ordered today   Testing/Procedures:  Your physician has requested that you have a lexiscan myoview. For further information please visit [email or web address]. Please follow instruction sheet, as given.  Follow-Up: At [facility], you and your health needs are our priority.  As part of our continuing mission to provide you with exceptional heart care, we have created designated Provider Care Teams.  These Care Teams include your primary Cardiologist (physician) and Advanced Practice Providers (APPs -  Physician Assistants and Nurse Practitioners) who all work together to provide you with the care you need, when you need it.  Your next appointment:    Follow up with Cecilie Kicks within a week after [facility]

## 2019-08-08 ENCOUNTER — Encounter: Payer: Self-pay | Admitting: Cardiology

## 2019-08-11 ENCOUNTER — Telehealth (HOSPITAL_COMMUNITY): Payer: Self-pay | Admitting: *Deleted

## 2019-08-11 NOTE — Telephone Encounter (Signed)
error 

## 2019-08-11 NOTE — Telephone Encounter (Signed)
Patient given detailed instructions per Myocardial Perfusion Study Information Sheet for the test on 06/02/20. Patient notified to arrive 15 minutes early and that it is imperative to arrive on time for appointment to keep from having the test rescheduled. ° If you need to cancel or reschedule your appointment, please call the office within 24 hours of your appointment. . Patient verbalized understanding. Shane Shelton ° ° ° °

## 2019-08-12 NOTE — Telephone Encounter (Signed)
error 

## 2019-08-13 ENCOUNTER — Ambulatory Visit (HOSPITAL_COMMUNITY): Payer: Medicare Other | Attending: Cardiovascular Disease

## 2019-08-13 ENCOUNTER — Other Ambulatory Visit: Payer: Self-pay

## 2019-08-13 DIAGNOSIS — I25119 Atherosclerotic heart disease of native coronary artery with unspecified angina pectoris: Secondary | ICD-10-CM | POA: Diagnosis present

## 2019-08-13 LAB — MYOCARDIAL PERFUSION IMAGING
LV dias vol: 78 mL (ref 62–150)
LV sys vol: 27 mL
Peak HR: 61 {beats}/min
Rest HR: 49 {beats}/min
SDS: 1
SRS: 0
SSS: 1
TID: 1.03

## 2019-08-13 MED ORDER — REGADENOSON 0.4 MG/5ML IV SOLN
0.4000 mg | Freq: Once | INTRAVENOUS | Status: AC
  Administered 2019-08-13: 0.4 mg via INTRAVENOUS

## 2019-08-13 MED ORDER — TECHNETIUM TC 99M TETROFOSMIN IV KIT
11.0000 | PACK | Freq: Once | INTRAVENOUS | Status: AC | PRN
  Administered 2019-08-13: 11 via INTRAVENOUS
  Filled 2019-08-13: qty 11

## 2019-08-13 MED ORDER — TECHNETIUM TC 99M TETROFOSMIN IV KIT
31.5000 | PACK | Freq: Once | INTRAVENOUS | Status: AC | PRN
  Administered 2019-08-13: 31.5 via INTRAVENOUS
  Filled 2019-08-13: qty 32

## 2019-08-15 NOTE — Progress Notes (Signed)
Cardiology Office Note   Date:  08/21/2019   ID:  Shane Shelton, Shane Shelton 15-Dec-1945, MRN [id number]  PCP:  [name], MD  Cardiologist:  Dr. Tamala Julian, MD   Chief Complaint  Patient presents with  . Follow-up    History of Present Illness: Shane Shelton is a 74 y.o. male who presents for two week follow up, seen for Dr. Tamala Julian.   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 with proximal RCA and RCA PL DES and moderateresidualproximal LAD and distal RCA disease.   He was most recently seen by Cecilie Kicks 08/07/2019 for chest pain and SOB which began 08/01/19. Initially his symptoms began with walking during his 4 mile walks which progressed to resting angina.  Imdur dose was increased from 30 mg to 45 mg p.o. daily.  Pt underwent Lexiscan Myoview 08/13/19 with no ischemia or previous myocardial infarction, considered a normal study. EF was normal 65%. Case was initially discussed with his primary cardiologist who felt that if symptoms persist despite normal stress test a cath should be pursued.    Patient initially scheduled as an in person office visit however called several days ago with complaints of sinus symptoms therefore he wished for a telemedicine visit.  Today he reports symptms improvement with dose increase of Imdur.  Stress test reassuring as above.  He reports that he has been walking more, up to 2 miles per day without anginal symptoms.  Reports no symptoms with walking up stairs or running errands.  We discussed long-term plan of symptom management and low threshold for repeat cardiac cath in the setting as above.  Patient is in agreement.  Denies shortness of breath, PND, LE edema, orthopnea, dizziness or syncope.  Past Medical History:  Diagnosis Date  . Cancer [facility])      Throat cancer 2011  . Coronary artery disease   . Hyperlipidemia   . Hypertension     Past Surgical History:  Procedure Laterality Date  . CARDIAC CATHETERIZATION  05/08/2017   S/P S/P PCI, DES of Mid RCA at [facility] in [place]  . COLONOSCOPY     multiple  . ESOPHAGOGASTRODUODENOSCOPY     multiple  . ESOPHAGOSCOPY W/ PERCUTANEOUS GASTROSTOMY TUBE PLACEMENT  2011     Current Outpatient Medications  Medication Sig Dispense Refill  . aspirin EC 81 MG tablet Take 81 mg by mouth daily.    . Coenzyme Q-10 200 MG CAPS Take 200 mg by mouth daily.    . isosorbide mononitrate (IMDUR) 30 MG 24 hr tablet Take 1-2 tablets (30-60 mg total) by mouth as directed. Take 2 tablets by mouth once a day alternating with 1 tablet by mouth once a day 90 tablet 6  . metoprolol succinate (TOPROL-XL) 25 MG 24 hr tablet Take 1 tablet (25 mg total) by mouth daily. 90 tablet 3  . nitroGLYCERIN (NITROSTAT) 0.4 MG SL tablet Place 1 tablet (0.4 mg total) under the tongue every 5 (five) minutes as needed for chest pain. 25 tablet 3  . pantoprazole (PROTONIX) 40 MG tablet TAKE 1 TABLET BY MOUTH  DAILY AS NEEDED 90 tablet 3  . rosuvastatin (CRESTOR) 40 MG tablet TAKE 1 TABLET BY MOUTH  DAILY 90 tablet 3   No current facility-administered medications for this visit.  Allergies:   Ambien [zolpidem], Gabapentin, and Statins    Social History:  The patient  reports that he quit smoking about 9 years ago. His smoking use included cigarettes. He has a 40.00 pack-year smoking history. He has never used smokeless tobacco. He reports current alcohol use of about 7.0 standard drinks of alcohol per week. He reports that he does not use drugs.   Family History:  The patient's family history includes Cancer in his brother and father; Diabetes in his mother; Heart disease in his brother and mother; Hyperlipidemia in his brother and mother.    ROS:  Please see the history of present illness.   Otherwise,  review of systems are positive for none.  All other systems are reviewed and negative.    PHYSICAL EXAM: VS:  Ht 6\' 2"  (1.88 m)   Wt 230 lb (104.3 kg)   BMI 29.53 kg/m  , BMI Body mass index is 29.53 kg/m.   General:  NAD Neuro: Alert and oriented.  Psych: Responds to questions appropriately with normal affect.    EKG:  EKG is not ordered today.  Recent Labs: 03/27/2019: ALT 18; BUN 18; Creatinine, Ser 1.10; Hemoglobin 14.0; Platelets 190; Potassium 4.3; Sodium 140    Lipid Panel    Component Value Date/Time   CHOL 143 03/27/2019 1039   TRIG 76 03/27/2019 1039   HDL 51 03/27/2019 1039   CHOLHDL 2.8 03/27/2019 1039   LDLCALC 77 03/27/2019 1039   LDLDIRECT 100 (H) 04/04/2017 1438      Wt Readings from Last 3 Encounters:  08/21/19 230 lb (104.3 kg)  08/13/19 230 lb (104.3 kg)  08/07/19 230 lb (104.3 kg)    Other studies Reviewed: Additional studies/ records that were reviewed today include:   Lexiscan stress test 08/13/19:   Nuclear stress EF: 65%. The left ventricular ejection fraction is normal (55-65%).  There was no ST segment deviation noted during stress.  This is a low risk study. There is no evidence of ischemia or previous myocardial infarction.  The study is normal.    ASSESSMENT AND PLAN:  1.  Increasing angina with history of CAD s/p PCI 2018: -Lexiscan stress test performed 08/13/2019 with no evidence of ischemia, EF is 65%. Case previously discussed with Dr. Tamala Julian, at which time felt to have a low threshold for LHC EF symptoms persist despite normal stress testing.   -Imdur increased to 30 mg to 45 mg p.o. daily with symptom improvement -Has slowly increased his exercise activity to 2 miles per day without anginal symptoms -Plan for close follow-up.  Discussed if symptoms become worrisome, plan for cardiac catheterization for reassessment of coronary artery disease  2.  HTN: -No BP cuff available>> telemetry medicine -Continue current regimen    3.  HLD:  -Continue statin    Current medicines are reviewed at length with the patient today.  The patient does not have concerns regarding medicines.  The following changes have been made:  no change  Labs/ tests ordered today include: None  No orders of the defined types were placed in this encounter.  Disposition:   FU with myself in 3 weeks  Signed, Kathyrn Drown, NP  08/21/2019 10:45 AM    [address], [place], [place]  [address] Phone: [phone]; Fax: [phone]

## 2019-08-21 ENCOUNTER — Encounter: Payer: Self-pay | Admitting: Cardiology

## 2019-08-21 ENCOUNTER — Telehealth (INDEPENDENT_AMBULATORY_CARE_PROVIDER_SITE_OTHER): Payer: Medicare Other | Admitting: Cardiology

## 2019-08-21 ENCOUNTER — Other Ambulatory Visit: Payer: Self-pay

## 2019-08-21 VITALS — Ht 74.0 in | Wt 230.0 lb

## 2019-08-21 DIAGNOSIS — E785 Hyperlipidemia, unspecified: Secondary | ICD-10-CM | POA: Diagnosis not present

## 2019-08-21 DIAGNOSIS — I10 Essential (primary) hypertension: Secondary | ICD-10-CM | POA: Diagnosis not present

## 2019-08-21 DIAGNOSIS — I25118 Atherosclerotic heart disease of native coronary artery with other forms of angina pectoris: Secondary | ICD-10-CM

## 2019-08-21 DIAGNOSIS — I739 Peripheral vascular disease, unspecified: Secondary | ICD-10-CM

## 2019-08-21 DIAGNOSIS — I25119 Atherosclerotic heart disease of native coronary artery with unspecified angina pectoris: Secondary | ICD-10-CM

## 2019-08-21 DIAGNOSIS — E7800 Pure hypercholesterolemia, unspecified: Secondary | ICD-10-CM

## 2019-08-21 DIAGNOSIS — Z955 Presence of coronary angioplasty implant and graft: Secondary | ICD-10-CM | POA: Diagnosis not present

## 2019-08-21 NOTE — Patient Instructions (Signed)
Medication Instructions:   Your physician recommends that you continue on your current medications as directed. Please refer to the Current Medication list given to you today.  *If you need a refill on your cardiac medications before your next appointment, please call your pharmacy*  Lab Work:  None ordered today  If you have labs (blood work) drawn today and your tests are completely normal, you will receive your results only by: Marland Kitchen MyChart Message (if you have MyChart) OR . A paper copy in the mail If you have any lab test that is abnormal or we need to change your treatment, we will call you to review the results.  Testing/Procedures:  None ordered today  Follow-Up: At [facility], you and your health needs are our priority.  As part of our continuing mission to provide you with exceptional heart care, we have created designated Provider Care Teams.  These Care Teams include your primary Cardiologist (physician) and Advanced Practice Providers (APPs -  Physician Assistants and Nurse Practitioners) who all work together to provide you with the care you need, when you need it.  Your next appointment:    On 09/18/19 at 8:45AM with Kathyrn Drown, NP

## 2019-08-22 ENCOUNTER — Other Ambulatory Visit: Payer: Self-pay | Admitting: Interventional Cardiology

## 2019-08-22 ENCOUNTER — Ambulatory Visit: Payer: Medicare Other | Attending: Internal Medicine

## 2019-08-22 DIAGNOSIS — Z23 Encounter for immunization: Secondary | ICD-10-CM | POA: Insufficient documentation

## 2019-08-22 MED ORDER — METOPROLOL SUCCINATE ER 25 MG PO TB24: 25.0000 mg | ORAL_TABLET | Freq: Every day | ORAL | 3 refills | Status: DC

## 2019-08-22 MED ORDER — ISOSORBIDE MONONITRATE ER 30 MG PO TB24: 30.0000 mg | ORAL_TABLET | ORAL | 6 refills | Status: DC

## 2019-08-25 NOTE — Progress Notes (Signed)
   Covid-19 Vaccination Clinic  Name:  Shane Shelton    MRN: [id number] DOB: May 24, 1946  08/22/2019  Mr. Shane Shelton was observed post Covid-19 immunization for 15 minutes without incidence. He was provided with Vaccine Information Sheet and instruction to access the V-Safe system.   Mr. Shane Shelton was instructed to call 911 with any severe reactions post vaccine: Marland Kitchen Difficulty breathing  . Swelling of your face and throat  . A fast heartbeat  . A bad rash all over your body  . Dizziness and weakness    Immunizations Administered    Name Date Dose VIS Date Route   Moderna COVID-19 Vaccine 08/22/2019  1:33 PM 0.5 mL 07/01/2019 Intramuscular   Manufacturer: Moderna   Lot: [id number]   [id number]

## 2019-09-12 NOTE — Progress Notes (Deleted)
Cardiology Office Note   Date:  09/12/2019   ID:  Shane Shelton 02/08/1946, MRN [id number]  PCP:  [name], MD  Cardiologist: Dr. Tamala Julian, MD  No chief complaint on file.   History of Present Illness: Shane Shelton is a 74 y.o. male who presents for 82-month follow-up, seen by Dr. Tamala Julian.  [name] has a hx of hyperlipidemia, hypertension and bilateral LE claudication with negative doppler 01/2017.Has a long hx of CAD with PCI in 2000 (on way back form [place]), recurrent angina 10/2018with repeat cath in [place]. withproximal RCA and RCA PL DES and moderateresidualproximal LAD and distal RCA disease.   He was seen by Cecilie Kicks 08/07/2019 for chest pain and SOB which began 08/01/19. Initially his symptoms began with walking during his 4 mile walks which progressed to resting angina.  Imdur dose was increased from 30 mg to 45 mg p.o. daily.  Pt underwent Lexiscan Myoview 08/13/19 with no ischemia or previous myocardial infarction, considered a normal study. EF was normal 65%. Case was initially discussed with his primary cardiologist who felt that if symptoms persist despite normal stress test a cath should be pursued.    Patient initially scheduled as an in person office visit however called several days ago with complaints of sinus symptoms therefore he wished for a telemedicine visit.    He was last seen by myself on 08/21/2019 and close follow-up at which time he reported symptom improvement with increased dose of Imdur. He was back up to walking 2 miles per day without anginal symptoms however discussed long-term plan of symptom management with low threshold for repeat cardiac cath in the setting as described above.      Past Medical History:  Diagnosis Date  . Cancer [facility])    Throat cancer 2011  . Coronary artery disease   . Hyperlipidemia   . Hypertension     Past Surgical History:  Procedure Laterality Date  . CARDIAC CATHETERIZATION   05/08/2017   S/P S/P PCI, DES of Mid RCA at [facility] in [place]  . COLONOSCOPY     multiple  . ESOPHAGOGASTRODUODENOSCOPY     multiple  . ESOPHAGOSCOPY W/ PERCUTANEOUS GASTROSTOMY TUBE PLACEMENT  2011     Current Outpatient Medications  Medication Sig Dispense Refill  . aspirin EC 81 MG tablet Take 81 mg by mouth daily.    . Coenzyme Q-10 200 MG CAPS Take 200 mg by mouth daily.    . isosorbide mononitrate (IMDUR) 30 MG 24 hr tablet Take 1-2 tablets (30-60 mg total) by mouth as directed. Take 2 tablets by mouth once a day alternating with 1 tablet by mouth once a day 90 tablet 6  . metoprolol succinate (TOPROL-XL) 25 MG 24 hr tablet Take 1 tablet (25 mg total) by mouth daily. 90 tablet 3  . nitroGLYCERIN (NITROSTAT) 0.4 MG SL tablet Place 1 tablet (0.4 mg total) under the tongue every 5 (five) minutes as needed for chest pain. 25 tablet 3  . pantoprazole (PROTONIX) 40 MG tablet TAKE 1 TABLET BY MOUTH  DAILY AS NEEDED 90 tablet 3  . rosuvastatin (CRESTOR) 40 MG tablet TAKE 1 TABLET BY MOUTH  DAILY 90 tablet 3   No current facility-administered medications for this visit.    Allergies:   Ambien [zolpidem], Gabapentin, and Statins    Social History:  The patient  reports that he quit smoking about 9 years ago. His smoking use included cigarettes. He has a 40.00  pack-year smoking history. He has never used smokeless tobacco. He reports current alcohol use of about 7.0 standard drinks of alcohol per week. He reports that he does not use drugs.   Family History:  The patient's ***family history includes Cancer in his brother and father; Diabetes in his mother; Heart disease in his brother and mother; Hyperlipidemia in his brother and mother.    ROS:  Please see the history of present illness.   Otherwise, review of systems are positive for {NONE DEFAULTED:18576::"none"}.   All other systems are reviewed and negative.    PHYSICAL EXAM: VS:  There were no vitals taken for  this visit. , BMI There is no height or weight on file to calculate BMI. GEN: Well nourished, well developed, in no acute distress HEENT: normal Neck: no JVD, carotid bruits, or masses Cardiac: ***RRR; no murmurs, rubs, or gallops,no edema  Respiratory:  clear to auscultation bilaterally, normal work of breathing GI: soft, nontender, nondistended, + BS MS: no deformity or atrophy Skin: warm and dry, no rash Neuro:  Strength and sensation are intact Psych: euthymic mood, full affect   EKG:  EKG {ACTION; IS/IS GI:087931 ordered today. The ekg ordered today demonstrates ***   Recent Labs: 03/27/2019: ALT 18; BUN 18; Creatinine, Ser 1.10; Hemoglobin 14.0; Platelets 190; Potassium 4.3; Sodium 140    Lipid Panel    Component Value Date/Time   CHOL 143 03/27/2019 1039   TRIG 76 03/27/2019 1039   HDL 51 03/27/2019 1039   CHOLHDL 2.8 03/27/2019 1039   LDLCALC 77 03/27/2019 1039   LDLDIRECT 100 (H) 04/04/2017 1438      Wt Readings from Last 3 Encounters:  08/21/19 230 lb (104.3 kg)  08/13/19 230 lb (104.3 kg)  08/07/19 230 lb (104.3 kg)      Other studies Reviewed: Additional studies/ records that were reviewed today include:  Lexiscan stress test 08/13/19:   Nuclear stress EF: 65%. The left ventricular ejection fraction is normal (55-65%).  There was no ST segment deviation noted during stress.  This is a low risk study. There is no evidence of ischemia or previous myocardial infarction.  The study is normal.   ASSESSMENT AND PLAN:  1.  ***   Current medicines are reviewed at length with the patient today.  The patient {ACTIONS; HAS/DOES NOT HAVE:19233} concerns regarding medicines.  The following changes have been made:  {PLAN; NO CHANGE:13088:s}  Labs/ tests ordered today include: *** No orders of the defined types were placed in this encounter.    Disposition:   FU with *** in {gen number [id number] {Days to years:10300}  Signed, Kathyrn Drown,  NP  09/12/2019 5:52 PM    [facility] [place], [place], [place]  [address] Phone: [phone]; Fax: [phone]

## 2019-09-17 ENCOUNTER — Ambulatory Visit: Payer: Medicare Other

## 2019-09-18 ENCOUNTER — Ambulatory Visit: Payer: Medicare Other | Admitting: Cardiology

## 2019-09-19 ENCOUNTER — Ambulatory Visit: Payer: Medicare Other

## 2019-09-21 NOTE — H&P (View-Only) (Signed)
Cardiology Office Note   Date:  10/01/2019   ID:  Shane Shelton, Shane Shelton July 25, 1946, MRN [id number]  PCP:  Shane Resides, MD  Cardiologist: Dr. Tamala Julian, MD   Chief Complaint  Patient presents with  . Follow-up    History of Present Illness: Shane Shelton is a 74 y.o. male who presents for 93-month follow-up, seen by Dr. Tamala Shelton.   Shane Shelton has a hx of hyperlipidemia, hypertension and bilateral LE claudication with negative doppler 01/2017.Has a long hx of CAD with PCI 2016 (on way back form [place]), recurrent angina 10/2018with repeat cath in [place]. withproximal RCA and RCA PL DES and moderateresidualproximal LAD and distal RCA disease.    He was seen by Shane Shelton 08/07/2019 for chest pain and SOB which began 08/01/19. Initially his symptoms began with walking during his 4 mile walks which progressed to resting angina. Imdur dose was increased from 30 mg to 45 mg p.o. daily.    Pt underwent Lexiscan Myoview 08/13/19 with no ischemia or previous myocardial infarction, considered a normal study. EF was normal 65%. Case was initially discussed with his primary cardiologist who felt that if symptoms persist despite normal stress test a cath should be pursued.   Patient initially scheduled as an in person office visit however called several days ago with complaints of sinus symptoms therefore he wished for a telemedicine visit. He was last seen by myself on 08/21/2019 and close follow-up at which time he reported symptom improvement with increased dose of Imdur. He was back up to walking 2 miles per day without anginal symptoms however repeat long-term plan of symptom management with low threshold for repeat cardiac cath in the setting as described above.   Today he reports he initially was feeling better symptomatically with the increase of Imdur however has more recently noticed that he will have anginal symptoms with less and less activity. He reports he has been  walking however, not nearly the duration he was able to several months back. As previously described, there was low threshold to repeat cath given know residual disease at last cath. Discussed repeat cardiac catheterization as more definitive evaluation for symptoms. Case discussed with Dr. Tamala Shelton who aggrees Is stable today with no chest pain, shortness of breath, LE edema, orthopnea, palpitations, PND or syncope.   Past Medical History:  Diagnosis Date  . Cancer [facility])    Throat cancer 2011  . Coronary artery disease   . Hyperlipidemia   . Hypertension     Past Surgical History:  Procedure Laterality Date  . CARDIAC CATHETERIZATION  05/08/2017   S/P S/P PCI, DES of Mid RCA at [facility] in [place]  . COLONOSCOPY     multiple  . ESOPHAGOGASTRODUODENOSCOPY     multiple  . ESOPHAGOSCOPY W/ PERCUTANEOUS GASTROSTOMY TUBE PLACEMENT  2011     Current Outpatient Medications  Medication Sig Dispense Refill  . aspirin EC 81 MG tablet Take 81 mg by mouth daily.    . Coenzyme Q-10 200 MG CAPS Take 200 mg by mouth daily.    . isosorbide mononitrate (IMDUR) 30 MG 24 hr tablet Take 45 mg by mouth daily.    . metoprolol succinate (TOPROL-XL) 25 MG 24 hr tablet Take 1 tablet (25 mg total) by mouth daily. 90 tablet 3  . pantoprazole (PROTONIX) 40 MG tablet TAKE 1 TABLET BY MOUTH  DAILY AS NEEDED 90 tablet 3  . rosuvastatin (CRESTOR) 40 MG tablet TAKE 1 TABLET BY MOUTH  DAILY 90 tablet 3  . nitroGLYCERIN (NITROSTAT) 0.4 MG SL tablet Place 1 tablet (0.4 mg total) under the tongue every 5 (five) minutes as needed for chest pain. 25 tablet 3   No current facility-administered medications for this visit.    Allergies:   Ambien [zolpidem], Gabapentin, and Statins    Social History:  The patient  reports that he quit smoking about 9 years ago. His smoking use included cigarettes. He has a 40.00 pack-year smoking history. He has never used smokeless tobacco. He reports current alcohol  use of about 7.0 standard drinks of alcohol per week. He reports that he does not use drugs.   Family History:  The patient's family history includes Cancer in his brother and father; Diabetes in his mother; Heart disease in his brother and mother; Hyperlipidemia in his brother and mother.    ROS:  Please see the history of present illness. Otherwise, review of systems are positive for none.  All other systems are reviewed and negative.    PHYSICAL EXAM: VS:  BP 124/68   Ht 6\' 2"  (1.88 m)   Wt 228 lb 12.8 oz (103.8 kg)   BMI 29.38 kg/m  , BMI Body mass index is 29.38 kg/m.   General: Well developed, well nourished, NAD Skin: Warm, dry, intact  Neck: Negative for carotid bruits. No JVD Lungs:Clear to ausculation bilaterally. No wheezes, rales, or rhonchi. Breathing is unlabored. Cardiovascular: RRR with S1 S2. No murmur Extremities: No edema. Radial pulses 2+ bilaterally Neuro: Alert and oriented. No focal deficits. No facial asymmetry. MAE spontaneously. Psych: Responds to questions appropriately with normal affect.     EKG:  EKG is not ordered today.   Recent Labs: 03/27/2019: ALT 18; BUN 18; Creatinine, Ser 1.10; Hemoglobin 14.0; Platelets 190; Potassium 4.3; Sodium 140    Lipid Panel    Component Value Date/Time   CHOL 143 03/27/2019 1039   TRIG 76 03/27/2019 1039   HDL 51 03/27/2019 1039   CHOLHDL 2.8 03/27/2019 1039   LDLCALC 77 03/27/2019 1039   LDLDIRECT 100 (H) 04/04/2017 1438    Wt Readings from Last 3 Encounters:  10/01/19 228 lb 12.8 oz (103.8 kg)  08/21/19 230 lb (104.3 kg)  08/13/19 230 lb (104.3 kg)    Other studies Reviewed: Additional studies/ records that were reviewed today include:   Lexiscan stress test 08/13/19:    Nuclear stress EF: 65%. The left ventricular ejection fraction is normal (55-65%).  There was no ST segment deviation noted during stress.  This is a low risk study. There is no evidence of ischemia or previous myocardial  infarction.  The study is normal.   ASSESSMENT AND PLAN:  1.Increasing angina with history of CAD s/p PCI 2018: -Lexiscan stress test performed 08/13/2019 with no evidence of ischemia, EF is 65%. Case previously discussed with Dr. Tamala Shelton, at which time felt to have a low threshold for LHC if symptoms persist>>given recurrent symptoms, will plan for LHC next week with Dr. Tamala Shelton -BMET, CBC today -Continue ASA   2. HTN: -Stable, 124/68 -Continue current regimen   3. HLD:  -Continue statin    Current medicines are reviewed at length with the patient today.  The patient does not have concerns regarding medicines.  The following changes have been made:  no change  Labs/ tests ordered today include: CBC, BMET No orders of the defined types were placed in this encounter.    Disposition:   FU with APP in 3 weeks  Signed, Sharee Pimple  Glenford Peers, NP  10/01/2019 2:59 PM    [facility] [place], [place], [place]  [address] Phone: [phone]; Fax: [phone]

## 2019-09-21 NOTE — Progress Notes (Signed)
Cardiology Office Note   Date:  10/01/2019   ID:  Shane Shelton 22-May-1946, MRN [id number]  PCP:  Shane Resides, MD  Cardiologist: Dr. Tamala Julian, MD   Chief Complaint  Patient presents with  . Follow-up    History of Present Illness: Shane Shelton is a 74 y.o. male who presents for 72-month follow-up, seen by Dr. Tamala Shelton.   Shane Shelton has a hx of hyperlipidemia, hypertension and bilateral LE claudication with negative doppler 01/2017.Has a long hx of CAD with PCI 2016 (on way back form [place]), recurrent angina 10/2018with repeat cath in [place]. withproximal RCA and RCA PL DES and moderateresidualproximal LAD and distal RCA disease.    He was seen by Shane Shelton 08/07/2019 for chest pain and SOB which began 08/01/19. Initially his symptoms began with walking during his 4 mile walks which progressed to resting angina. Imdur dose was increased from 30 mg to 45 mg p.o. daily.    Pt underwent Lexiscan Myoview 08/13/19 with no ischemia or previous myocardial infarction, considered a normal study. EF was normal 65%. Case was initially discussed with his primary cardiologist who felt that if symptoms persist despite normal stress test a cath should be pursued.   Patient initially scheduled as an in person office visit however called several days ago with complaints of sinus symptoms therefore he wished for a telemedicine visit. He was last seen by myself on 08/21/2019 and close follow-up at which time he reported symptom improvement with increased dose of Imdur. He was back up to walking 2 miles per day without anginal symptoms however repeat long-term plan of symptom management with low threshold for repeat cardiac cath in the setting as described above.   Today he reports he initially was feeling better symptomatically with the increase of Imdur however has more recently noticed that he will have anginal symptoms with less and less activity. He reports he has been  walking however, not nearly the duration he was able to several months back. As previously described, there was low threshold to repeat cath given know residual disease at last cath. Discussed repeat cardiac catheterization as more definitive evaluation for symptoms. Case discussed with Dr. Tamala Shelton who aggrees Is stable today with no chest pain, shortness of breath, LE edema, orthopnea, palpitations, PND or syncope.   Past Medical History:  Diagnosis Date  . Cancer [facility])    Throat cancer 2011  . Coronary artery disease   . Hyperlipidemia   . Hypertension     Past Surgical History:  Procedure Laterality Date  . CARDIAC CATHETERIZATION  05/08/2017   S/P S/P PCI, DES of Mid RCA at [facility] in [place]  . COLONOSCOPY     multiple  . ESOPHAGOGASTRODUODENOSCOPY     multiple  . ESOPHAGOSCOPY W/ PERCUTANEOUS GASTROSTOMY TUBE PLACEMENT  2011     Current Outpatient Medications  Medication Sig Dispense Refill  . aspirin EC 81 MG tablet Take 81 mg by mouth daily.    . Coenzyme Q-10 200 MG CAPS Take 200 mg by mouth daily.    . isosorbide mononitrate (IMDUR) 30 MG 24 hr tablet Take 45 mg by mouth daily.    . metoprolol succinate (TOPROL-XL) 25 MG 24 hr tablet Take 1 tablet (25 mg total) by mouth daily. 90 tablet 3  . pantoprazole (PROTONIX) 40 MG tablet TAKE 1 TABLET BY MOUTH  DAILY AS NEEDED 90 tablet 3  . rosuvastatin (CRESTOR) 40 MG tablet TAKE 1 TABLET BY MOUTH  DAILY 90 tablet 3  . nitroGLYCERIN (NITROSTAT) 0.4 MG SL tablet Place 1 tablet (0.4 mg total) under the tongue every 5 (five) minutes as needed for chest pain. 25 tablet 3   No current facility-administered medications for this visit.    Allergies:   Ambien [zolpidem], Gabapentin, and Statins    Social History:  The patient  reports that he quit smoking about 9 years ago. His smoking use included cigarettes. He has a 40.00 pack-year smoking history. He has never used smokeless tobacco. He reports current alcohol  use of about 7.0 standard drinks of alcohol per week. He reports that he does not use drugs.   Family History:  The patient's family history includes Cancer in his brother and father; Diabetes in his mother; Heart disease in his brother and mother; Hyperlipidemia in his brother and mother.    ROS:  Please see the history of present illness. Otherwise, review of systems are positive for none.  All other systems are reviewed and negative.    PHYSICAL EXAM: VS:  BP 124/68   Ht 6\' 2"  (1.88 m)   Wt 228 lb 12.8 oz (103.8 kg)   BMI 29.38 kg/m  , BMI Body mass index is 29.38 kg/m.   General: Well developed, well nourished, NAD Skin: Warm, dry, intact  Neck: Negative for carotid bruits. No JVD Lungs:Clear to ausculation bilaterally. No wheezes, rales, or rhonchi. Breathing is unlabored. Cardiovascular: RRR with S1 S2. No murmur Extremities: No edema. Radial pulses 2+ bilaterally Neuro: Alert and oriented. No focal deficits. No facial asymmetry. MAE spontaneously. Psych: Responds to questions appropriately with normal affect.     EKG:  EKG is not ordered today.   Recent Labs: 03/27/2019: ALT 18; BUN 18; Creatinine, Ser 1.10; Hemoglobin 14.0; Platelets 190; Potassium 4.3; Sodium 140    Lipid Panel    Component Value Date/Time   CHOL 143 03/27/2019 1039   TRIG 76 03/27/2019 1039   HDL 51 03/27/2019 1039   CHOLHDL 2.8 03/27/2019 1039   LDLCALC 77 03/27/2019 1039   LDLDIRECT 100 (H) 04/04/2017 1438    Wt Readings from Last 3 Encounters:  10/01/19 228 lb 12.8 oz (103.8 kg)  08/21/19 230 lb (104.3 kg)  08/13/19 230 lb (104.3 kg)    Other studies Reviewed: Additional studies/ records that were reviewed today include:   Lexiscan stress test 08/13/19:    Nuclear stress EF: 65%. The left ventricular ejection fraction is normal (55-65%).  There was no ST segment deviation noted during stress.  This is a low risk study. There is no evidence of ischemia or previous myocardial  infarction.  The study is normal.   ASSESSMENT AND PLAN:  1.Increasing angina with history of CAD s/p PCI 2018: -Lexiscan stress test performed 08/13/2019 with no evidence of ischemia, EF is 65%. Case previously discussed with Dr. Tamala Shelton, at which time felt to have a low threshold for LHC if symptoms persist>>given recurrent symptoms, will plan for LHC next week with Dr. Tamala Shelton -BMET, CBC today -Continue ASA   2. HTN: -Stable, 124/68 -Continue current regimen   3. HLD:  -Continue statin    Current medicines are reviewed at length with the patient today.  The patient does not have concerns regarding medicines.  The following changes have been made:  no change  Labs/ tests ordered today include: CBC, BMET No orders of the defined types were placed in this encounter.    Disposition:   FU with APP in 3 weeks  Signed, Shane Shelton  Glenford Peers, NP  10/01/2019 2:59 PM    [facility] [address], [place], [place]  [address] Phone: [phone]; Fax: [phone]

## 2019-09-26 ENCOUNTER — Ambulatory Visit: Payer: Medicare Other | Attending: Internal Medicine

## 2019-09-26 DIAGNOSIS — Z23 Encounter for immunization: Secondary | ICD-10-CM

## 2019-09-26 NOTE — Progress Notes (Signed)
   Covid-19 Vaccination Clinic  Name:  Shane Shelton    MRN: [id number] DOB: 08-21-1945  09/26/2019  Mr. [name] was observed post Covid-19 immunization for 15 minutes without incidence. He was provided with Vaccine Information Sheet and instruction to access the V-Safe system.   Mr. [name] was instructed to call 911 with any severe reactions post vaccine: Marland Kitchen Difficulty breathing  . Swelling of your face and throat  . A fast heartbeat  . A bad rash all over your body  . Dizziness and weakness    Immunizations Administered    Name Date Dose VIS Date Route   Moderna COVID-19 Vaccine 09/26/2019 11:43 AM 0.5 mL 07/01/2019 Intramuscular   Manufacturer: Moderna   Lot: [id number]   [id number]

## 2019-10-01 ENCOUNTER — Other Ambulatory Visit: Payer: Self-pay

## 2019-10-01 ENCOUNTER — Encounter: Payer: Self-pay | Admitting: Cardiology

## 2019-10-01 ENCOUNTER — Ambulatory Visit: Payer: Medicare Other | Admitting: Cardiology

## 2019-10-01 VITALS — BP 124/68 | HR 56 | Ht 74.0 in | Wt 228.8 lb

## 2019-10-01 DIAGNOSIS — R001 Bradycardia, unspecified: Secondary | ICD-10-CM

## 2019-10-01 DIAGNOSIS — R079 Chest pain, unspecified: Secondary | ICD-10-CM | POA: Diagnosis not present

## 2019-10-01 DIAGNOSIS — I10 Essential (primary) hypertension: Secondary | ICD-10-CM | POA: Diagnosis not present

## 2019-10-01 DIAGNOSIS — I25119 Atherosclerotic heart disease of native coronary artery with unspecified angina pectoris: Secondary | ICD-10-CM

## 2019-10-01 DIAGNOSIS — Z01812 Encounter for preprocedural laboratory examination: Secondary | ICD-10-CM | POA: Diagnosis not present

## 2019-10-01 DIAGNOSIS — E7800 Pure hypercholesterolemia, unspecified: Secondary | ICD-10-CM

## 2019-10-01 NOTE — Patient Instructions (Addendum)
Medication Instructions:  Your physician recommends that you continue on your current medications as directed. Please refer to the Current Medication list given to you today.  *If you need a refill on your cardiac medications before your next appointment, please call your pharmacy*   Lab Work:  You will have labs drawn today: BMET and CBC  If you have labs (blood work) drawn today and your tests are completely normal, you will receive your results only by: Marland Kitchen MyChart Message (if you have MyChart) OR . A paper copy in the mail If you have any lab test that is abnormal or we need to change your treatment, we will call you to review the results.   Testing/Procedures:  Your Pre-procedure COVID-19 Testing will be done on  at [place], After your swab you will be given a mask to wear and instructed to go home and quarantine you are not allowed to have visitors until after your procedure. If you test positive you will be notified and your procedure will be cancelled.   Your physician has requested that you have a cardiac catheterization. Cardiac catheterization is used to diagnose and/or treat various heart conditions. Doctors may recommend this procedure for a number of different reasons. The most common reason is to evaluate chest pain. Chest pain can be a symptom of coronary artery disease (CAD), and cardiac catheterization can show whether plaque is narrowing or blocking your heart's arteries. This procedure is also used to evaluate the valves, as well as measure the blood flow and oxygen levels in different parts of your heart. For further information please visit [email or web address]. Please follow instruction sheet, as given.   Follow-Up: At [facility], you and your health needs are our priority.  As part of our continuing mission to provide you with exceptional heart care, we have created designated Provider Care Teams.  These Care Teams include your primary  Cardiologist (physician) and Advanced Practice Providers (APPs -  Physician Assistants and Nurse Practitioners) who all work together to provide you with the care you need, when you need it.  We recommend signing up for the patient portal called "MyChart".  Sign up information is provided on this After Visit Summary.  MyChart is used to connect with patients for Virtual Visits (Telemedicine).  Patients are able to view lab/test results, encounter notes, upcoming appointments, etc.  Non-urgent messages can be sent to your provider as well.   To learn more about what you can do with MyChart, go to [email or web address].    Your next appointment:    On 10/24/19 at 2:15PM with Shane Drown, NP

## 2019-10-02 LAB — BASIC METABOLIC PANEL
BUN/Creatinine Ratio: 16 (ref 10–24)
BUN: 19 mg/dL (ref 8–27)
CO2: 23 mmol/L (ref 20–29)
Calcium: 9.3 mg/dL (ref 8.6–10.2)
Chloride: 103 mmol/L (ref 96–106)
Creatinine, Ser: 1.22 mg/dL (ref 0.76–1.27)
GFR calc Af Amer: 68 mL/min/{1.73_m2} (ref >59)
GFR calc non Af Amer: 59 mL/min/{1.73_m2} — ABNORMAL LOW (ref >59)
Glucose: 163 mg/dL — ABNORMAL HIGH (ref 65–99)
Potassium: 4.3 mmol/L (ref 3.5–5.2)
Sodium: 141 mmol/L (ref 134–144)

## 2019-10-02 LAB — CBC
Hematocrit: 40.8 % (ref 37.5–51.0)
Hemoglobin: 13.7 g/dL (ref 13.0–17.7)
MCH: 30.6 pg (ref 26.6–33.0)
MCHC: 33.6 g/dL (ref 31.5–35.7)
MCV: 91 fL (ref 79–97)
Platelets: 203 10*3/uL (ref 150–450)
RBC: 4.48 x10E6/uL (ref 4.14–5.80)
RDW: 12.7 % (ref 11.6–15.4)
WBC: 3.5 10*3/uL (ref 3.4–10.8)

## 2019-10-06 ENCOUNTER — Other Ambulatory Visit (HOSPITAL_COMMUNITY)
Admission: RE | Admit: 2019-10-06 | Discharge: 2019-10-06 | Disposition: A | Discharge status: Home / Self Care | Payer: Medicare Other | Source: Ambulatory Visit | Attending: Interventional Cardiology | Admitting: Interventional Cardiology

## 2019-10-06 DIAGNOSIS — Z20822 Contact with and (suspected) exposure to covid-19: Secondary | ICD-10-CM | POA: Insufficient documentation

## 2019-10-06 DIAGNOSIS — Z01812 Encounter for preprocedural laboratory examination: Secondary | ICD-10-CM | POA: Diagnosis not present

## 2019-10-07 ENCOUNTER — Telehealth: Payer: Self-pay | Admitting: *Deleted

## 2019-10-07 LAB — SARS CORONAVIRUS 2 (TAT 6-24 HRS): SARS Coronavirus 2: NEGATIVE

## 2019-10-07 NOTE — Telephone Encounter (Signed)
Pt contacted pre-catheterization scheduled at [facility] for: Thursday October 09, 2019 7:30 AM Verified arrival time and place: [facility]) at: 5:30 AM   No solid food after midnight prior to cath, clear liquids until 5 AM day of procedure. Contrast allergy: no  Hold: Aleve-until post procedure- GFR 59  AM meds can be  taken pre-cath with sip of water including: ASA 81 mg   Confirmed patient has responsible adult to drive home post procedure and observe 24 hours after arriving home: yes  Currently, due to Covid-19 pandemic, only one person will be allowed with patient. Must be the same person for patient's entire stay and will be required to wear a mask. They will be asked to wait in the waiting room for the duration of the patient's stay.  Patients are required to wear a mask when they enter the hospital.      COVID-19 Pre-Screening Questions:  . In the past 7 to 10 days have you had a cough,  shortness of breath, headache, congestion, fever (100 or greater) body aches, chills, sore throat, or sudden loss of taste or sense of smell? no . Have you been around anyone with known Covid 19 in the past 7 to 10 days? no . Have you been around anyone who is awaiting Covid 19 test results in the past 7 to 10 days? no . Have you been around anyone who has been exposed to Covid 19, or has mentioned symptoms of Covid 19 within the past 7 to 10 days? No  I reviewed procedure/mask/visitor instructions, COVID-19 screening questions with patient, he verbalized understanding, thanked me for call.

## 2019-10-09 ENCOUNTER — Telehealth: Payer: Self-pay | Admitting: Interventional Cardiology

## 2019-10-09 ENCOUNTER — Encounter (HOSPITAL_COMMUNITY)
Admission: RE | Disposition: A | Discharge status: Home / Self Care | Payer: Self-pay | Source: Home / Self Care | Attending: Interventional Cardiology

## 2019-10-09 ENCOUNTER — Ambulatory Visit (HOSPITAL_COMMUNITY)
Admission: RE | Admit: 2019-10-09 | Discharge: 2019-10-09 | Disposition: A | Discharge status: Home / Self Care | Payer: Medicare Other | Attending: Interventional Cardiology | Admitting: Interventional Cardiology

## 2019-10-09 ENCOUNTER — Other Ambulatory Visit: Payer: Self-pay

## 2019-10-09 DIAGNOSIS — I10 Essential (primary) hypertension: Secondary | ICD-10-CM | POA: Insufficient documentation

## 2019-10-09 DIAGNOSIS — Z87891 Personal history of nicotine dependence: Secondary | ICD-10-CM | POA: Insufficient documentation

## 2019-10-09 DIAGNOSIS — Z888 Allergy status to other drugs, medicaments and biological substances status: Secondary | ICD-10-CM | POA: Diagnosis not present

## 2019-10-09 DIAGNOSIS — Z79899 Other long term (current) drug therapy: Secondary | ICD-10-CM | POA: Insufficient documentation

## 2019-10-09 DIAGNOSIS — I25119 Atherosclerotic heart disease of native coronary artery with unspecified angina pectoris: Secondary | ICD-10-CM | POA: Insufficient documentation

## 2019-10-09 DIAGNOSIS — Z7982 Long term (current) use of aspirin: Secondary | ICD-10-CM | POA: Diagnosis not present

## 2019-10-09 DIAGNOSIS — E785 Hyperlipidemia, unspecified: Secondary | ICD-10-CM | POA: Insufficient documentation

## 2019-10-09 DIAGNOSIS — Z955 Presence of coronary angioplasty implant and graft: Secondary | ICD-10-CM | POA: Insufficient documentation

## 2019-10-09 HISTORY — PX: INTRAVASCULAR PRESSURE WIRE/FFR STUDY: CATH118243

## 2019-10-09 HISTORY — PX: LEFT HEART CATH AND CORONARY ANGIOGRAPHY: CATH118249

## 2019-10-09 LAB — POCT ACTIVATED CLOTTING TIME: Activated Clotting Time: 257 seconds

## 2019-10-09 SURGERY — LEFT HEART CATH AND CORONARY ANGIOGRAPHY
Anesthesia: LOCAL

## 2019-10-09 MED ORDER — ROSUVASTATIN CALCIUM 40 MG PO TABS: 40.0000 mg | ORAL_TABLET | Freq: Every evening | ORAL | Status: DC

## 2019-10-09 MED ORDER — HEPARIN SODIUM (PORCINE) 1000 UNIT/ML IJ SOLN
INTRAMUSCULAR | Status: DC | PRN
  Administered 2019-10-09 (×2): 5000 [IU] via INTRAVENOUS

## 2019-10-09 MED ORDER — CLOPIDOGREL BISULFATE 75 MG PO TABS: 300.0000 mg | ORAL_TABLET | Freq: Every day | ORAL | Status: DC

## 2019-10-09 MED ORDER — SODIUM CHLORIDE 0.9% FLUSH: 3.0000 mL | Freq: Two times a day (BID) | INTRAVENOUS | Status: DC

## 2019-10-09 MED ORDER — HEPARIN (PORCINE) IN NACL 1000-0.9 UT/500ML-% IV SOLN
INTRAVENOUS | Status: AC
  Filled 2019-10-09: qty 1000

## 2019-10-09 MED ORDER — PANTOPRAZOLE SODIUM 40 MG PO TBEC: 40.0000 mg | DELAYED_RELEASE_TABLET | Freq: Every evening | ORAL | Status: DC

## 2019-10-09 MED ORDER — LIDOCAINE HCL (PF) 1 % IJ SOLN
INTRAMUSCULAR | Status: DC | PRN
  Administered 2019-10-09 (×2): 2 mL

## 2019-10-09 MED ORDER — MIDAZOLAM HCL 2 MG/2ML IJ SOLN
INTRAMUSCULAR | Status: DC | PRN
  Administered 2019-10-09 (×2): 1 mg via INTRAVENOUS

## 2019-10-09 MED ORDER — COENZYME Q-10 200 MG PO CAPS: 200.0000 mg | ORAL_CAPSULE | Freq: Every evening | ORAL | Status: DC

## 2019-10-09 MED ORDER — ASPIRIN EC 81 MG PO TBEC: 81.0000 mg | DELAYED_RELEASE_TABLET | Freq: Every day | ORAL | Status: DC

## 2019-10-09 MED ORDER — HEPARIN (PORCINE) IN NACL 1000-0.9 UT/500ML-% IV SOLN
INTRAVENOUS | Status: DC | PRN
  Administered 2019-10-09 (×2): 500 mL

## 2019-10-09 MED ORDER — SODIUM CHLORIDE 0.9% FLUSH: 3.0000 mL | INTRAVENOUS | Status: DC | PRN

## 2019-10-09 MED ORDER — ISOSORBIDE MONONITRATE ER 60 MG PO TB24: 60.0000 mg | ORAL_TABLET | Freq: Every evening | ORAL | 11 refills | Status: DC

## 2019-10-09 MED ORDER — NITROGLYCERIN 0.4 MG SL SUBL: 0.4000 mg | SUBLINGUAL_TABLET | SUBLINGUAL | Status: DC | PRN

## 2019-10-09 MED ORDER — MIDAZOLAM HCL 2 MG/2ML IJ SOLN
INTRAMUSCULAR | Status: AC
  Filled 2019-10-09: qty 2

## 2019-10-09 MED ORDER — HEPARIN SODIUM (PORCINE) 1000 UNIT/ML IJ SOLN
INTRAMUSCULAR | Status: AC
  Filled 2019-10-09: qty 1

## 2019-10-09 MED ORDER — METOPROLOL SUCCINATE ER 25 MG PO TB24: 25.0000 mg | ORAL_TABLET | Freq: Every evening | ORAL | Status: DC

## 2019-10-09 MED ORDER — IOHEXOL 350 MG/ML SOLN
INTRAVENOUS | Status: DC | PRN
  Administered 2019-10-09: 130 mL

## 2019-10-09 MED ORDER — SODIUM CHLORIDE 0.9 % WEIGHT BASED INFUSION: 1.0000 mL/kg/h | INTRAVENOUS | Status: DC

## 2019-10-09 MED ORDER — SODIUM CHLORIDE 0.9 % IV SOLN: INTRAVENOUS | Status: AC

## 2019-10-09 MED ORDER — SODIUM CHLORIDE 0.9 % WEIGHT BASED INFUSION
3.0000 mL/kg/h | INTRAVENOUS | Status: DC
  Administered 2019-10-09: 3 mL/kg/h via INTRAVENOUS

## 2019-10-09 MED ORDER — SODIUM CHLORIDE 0.9 % IV SOLN: 250.0000 mL | INTRAVENOUS | Status: DC | PRN

## 2019-10-09 MED ORDER — LIDOCAINE HCL (PF) 1 % IJ SOLN
INTRAMUSCULAR | Status: AC
  Filled 2019-10-09: qty 30

## 2019-10-09 MED ORDER — ONDANSETRON HCL 4 MG/2ML IJ SOLN: 4.0000 mg | Freq: Four times a day (QID) | INTRAMUSCULAR | Status: DC | PRN

## 2019-10-09 MED ORDER — FENTANYL CITRATE (PF) 100 MCG/2ML IJ SOLN
INTRAMUSCULAR | Status: AC
  Filled 2019-10-09: qty 2

## 2019-10-09 MED ORDER — ACETAMINOPHEN 500 MG PO TABS: 1000.0000 mg | ORAL_TABLET | Freq: Four times a day (QID) | ORAL | Status: DC | PRN

## 2019-10-09 MED ORDER — LABETALOL HCL 5 MG/ML IV SOLN: 10.0000 mg | INTRAVENOUS | Status: DC | PRN

## 2019-10-09 MED ORDER — HYDRALAZINE HCL 20 MG/ML IJ SOLN: 10.0000 mg | INTRAMUSCULAR | Status: DC | PRN

## 2019-10-09 MED ORDER — VERAPAMIL HCL 2.5 MG/ML IV SOLN
INTRAVENOUS | Status: DC | PRN
  Administered 2019-10-09: 10 mL via INTRA_ARTERIAL

## 2019-10-09 MED ORDER — VERAPAMIL HCL 2.5 MG/ML IV SOLN
INTRAVENOUS | Status: AC
  Filled 2019-10-09: qty 2

## 2019-10-09 MED ORDER — CLOPIDOGREL BISULFATE 75 MG PO TABS: 75.0000 mg | ORAL_TABLET | Freq: Every day | ORAL | 11 refills | Status: DC

## 2019-10-09 MED ORDER — ASPIRIN 81 MG PO CHEW: 81.0000 mg | CHEWABLE_TABLET | ORAL | Status: DC

## 2019-10-09 MED ORDER — NAPROXEN SODIUM 220 MG PO TABS: 220.0000 mg | ORAL_TABLET | Freq: Two times a day (BID) | ORAL | Status: DC | PRN

## 2019-10-09 MED ORDER — ACETAMINOPHEN 325 MG PO TABS: 650.0000 mg | ORAL_TABLET | ORAL | Status: DC | PRN

## 2019-10-09 MED ORDER — FENTANYL CITRATE (PF) 100 MCG/2ML IJ SOLN
INTRAMUSCULAR | Status: DC | PRN
  Administered 2019-10-09 (×2): 25 ug via INTRAVENOUS

## 2019-10-09 SURGICAL SUPPLY — 13 items
CATH DXT MULTI JL4 JR4 ANG PIG (CATHETERS) ×2 IMPLANT
CATH VISTA GUIDE 6FR XBLAD4 (CATHETERS) ×2 IMPLANT
DEVICE RAD COMP TR BAND LRG (VASCULAR PRODUCTS) ×2 IMPLANT
GLIDESHEATH SLEND SS 6F .021 (SHEATH) ×2 IMPLANT
GUIDEWIRE INQWIRE 1.5J.035X260 (WIRE) ×1 IMPLANT
GUIDEWIRE PRESSURE COMET II (WIRE) ×2 IMPLANT
INQWIRE 1.5J .035X260CM (WIRE) ×2
KIT ESSENTIALS PG (KITS) ×2 IMPLANT
KIT HEART LEFT (KITS) ×2 IMPLANT
PACK CARDIAC CATHETERIZATION (CUSTOM PROCEDURE TRAY) ×2 IMPLANT
SHEATH PROBE COVER 6X72 (BAG) ×2 IMPLANT
TRANSDUCER W/STOPCOCK (MISCELLANEOUS) ×2 IMPLANT
TUBING CIL FLEX 10 FLL-RA (TUBING) ×2 IMPLANT

## 2019-10-09 NOTE — Telephone Encounter (Signed)
New Message  Patient called and he just had a LEFT HEART CATH AND CORONARY ANGIOGRAPHY done today by dr. Tamala Julian and he said he is still bleeding where the IV location was. He wants to know what can he do to stop the bleeding and if that is normal  Please call to discuss

## 2019-10-09 NOTE — Interval H&P Note (Signed)
Cath Lab Visit (complete for each Cath Lab visit)  Clinical Evaluation Leading to the Procedure:   ACS: No.  Non-ACS:    Anginal Classification: CCS III  Anti-ischemic medical therapy: Maximal Therapy (2 or more classes of medications)  Non-Invasive Test Results: No non-invasive testing performed  Prior CABG: No previous CABG      History and Physical Interval Note:  10/09/2019 7:18 AM  Shane Shelton  has presented today for surgery, with the diagnosis of chest pain.  The various methods of treatment have been discussed with the patient and family. After consideration of risks, benefits and other options for treatment, the patient has consented to  Procedure(s): LEFT HEART CATH AND CORONARY ANGIOGRAPHY (N/A) as a surgical intervention.  The patient's history has been reviewed, patient examined, no change in status, stable for surgery.  I have reviewed the patient's chart and labs.  Questions were answered to the patient's satisfaction.     Belva Crome III

## 2019-10-09 NOTE — Telephone Encounter (Signed)
Spoke with pt who reporting he experienced some bleeding at IV site.He has since applied pressure, a new dressing and bleeding has stopped. Nurse advised pt to continue to monitor site and contact office if symptom reoccur.

## 2019-10-09 NOTE — CV Procedure (Signed)
   Left heart cath with coronary angiography via left radial.  Real-time vascular ultrasound demonstrated a thrombosed right radial artery.  30% distal left main  Proximal to mid LAD 60 to 70% stenosis.  Distal LAD 50 to 60%.  DFR on the proximal disease was 0.93 at rest and 0.9 and 0.89 after contrast injection.  Circumflex is small without obstructive disease.  RCA is dominant with widely patent stents.  50% stenosis proximal to the stent and 40% stenosis distal to the stent  Normal LV function and LVEDP  Start Plavix to prevent thrombosis.  Intensify statin therapy.  Optimize nitrate therapy.  If he continues to have symptoms, even though physiology and perfusion imaging do not demonstrate ischemia in LAD territory, may need to have proximal LAD stent placed.

## 2019-10-09 NOTE — Progress Notes (Signed)
TR BAND REMOVAL  LOCATION:    left radial  DEFLATED PER PROTOCOL:    Yes.    TIME BAND OFF / DRESSING APPLIED:    1335   SITE UPON ARRIVAL:    Level 0  SITE AFTER BAND REMOVAL:    Level 0  CIRCULATION SENSATION AND MOVEMENT:    Within Normal Limits   Yes.    COMMENTS:   gauze and hypafix dsg applied

## 2019-10-09 NOTE — Discharge Instructions (Signed)
Drink plenty of fluids for 48 hours and keep left wrist elevated at heart level for 24 hours  Radial Site Care   This sheet gives you information about how to care for yourself after your procedure. Your health care provider may also give you more specific instructions. If you have problems or questions, contact your health care provider. What can I expect after the procedure? After the procedure, it is common to have:  Bruising and tenderness at the catheter insertion area. Follow these instructions at home: Medicines  Take over-the-counter and prescription medicines only as told by your health care provider. Insertion site care 1. Follow instructions from your health care provider about how to take care of your insertion site. Make sure you: ? Wash your hands with soap and water before you change your bandage (dressing). If soap and water are not available, use hand sanitizer. ? Remove your dressing as told by your health care provider. In 24 hours 2. Check your insertion site every day for signs of infection. Check for: ? Redness, swelling, or pain. ? Fluid or blood. ? Pus or a bad smell. ? Warmth. 3. Do not take baths, swim, or use a hot tub until your health care provider approves. 4. You may shower 24-48 hours after the procedure, or as directed by your health care provider. ? Remove the dressing and gently wash the site with plain soap and water. ? Pat the area dry with a clean towel. ? Do not rub the site. That could cause bleeding. 5. Do not apply powder or lotion to the site. Activity   1. For 24 hours after the procedure, or as directed by your health care provider: ? Do not flex or bend the affected arm. ? Do not push or pull heavy objects with the affected arm. ? Do not drive yourself home from the hospital or clinic. You may drive 24 hours after the procedure unless your health care provider tells you not to. ? Do not operate machinery or power tools. 2. Do not lift  anything that is heavier than 10 lb (4.5 kg), or the limit that you are told, until your health care provider says that it is safe.  For 4 days 3. Ask your health care provider when it is okay to: ? Return to work or school. ? Resume usual physical activities or sports. ? Resume sexual activity. General instructions  If the catheter site starts to bleed, raise your arm and put firm pressure on the site. If the bleeding does not stop, get help right away. This is a medical emergency.  If you went home on the same day as your procedure, a responsible adult should be with you for the first 24 hours after you arrive home.  Keep all follow-up visits as told by your health care provider. This is important. Contact a health care provider if:  You have a fever.  You have redness, swelling, or yellow drainage around your insertion site. Get help right away if:  You have unusual pain at the radial site.  The catheter insertion area swells very fast.  The insertion area is bleeding, and the bleeding does not stop when you hold steady pressure on the area.  Your arm or hand becomes pale, cool, tingly, or numb. These symptoms may represent a serious problem that is an emergency. Do not wait to see if the symptoms will go away. Get medical help right away. Call your local emergency services (911 in the [place]  Do not drive yourself to the hospital. Summary  After the procedure, it is common to have bruising and tenderness at the site.  Follow instructions from your health care provider about how to take care of your radial site wound. Check the wound every day for signs of infection.  Do not lift anything that is heavier than 10 lb (4.5 kg), or the limit that you are told, until your health care provider says that it is safe. This information is not intended to replace advice given to you by your health care provider. Make sure you discuss any questions you have with your health care  provider. Document Revised: 08/22/2017 Document Reviewed: 08/22/2017 Elsevier Patient Education  2020 [employer].

## 2019-10-17 MED ORDER — ISOSORBIDE MONONITRATE ER 60 MG PO TB24: 60.0000 mg | ORAL_TABLET | Freq: Every evening | ORAL | 3 refills | Status: DC

## 2019-10-17 MED ORDER — CLOPIDOGREL BISULFATE 75 MG PO TABS: 75.0000 mg | ORAL_TABLET | Freq: Every day | ORAL | 3 refills | Status: DC

## 2019-10-18 NOTE — Progress Notes (Signed)
Cardiology Office Note   Date:  10/24/2019   ID:  Aithan, Baccam 1945/10/01, MRN [id number]  PCP:  [name], MD  Cardiologist: Dr. Tamala Julian, MD  Chief Complaint  Patient presents with  . Follow-up   History of Present Illness: Shane Shelton is a 74 y.o. male who presents for post cath follow-up, seen by Dr. Tamala Julian.  Shane Shelton has a hx of hyperlipidemia, hypertension and bilateral LE claudication with negative doppler 01/2017.Has a long hx of CAD with PCI 2016 (on way back form [place]), recurrent angina 10/2018with repeat cath in [place]. withproximal RCA and RCA PL DES and moderateresidualproximal LAD and distal RCA disease.    He was seen by Cecilie Kicks 08/07/2019 for chest pain and SOB which began 08/01/19. Initially his symptoms began with walking during his 4 mile walks which progressed to resting angina. Imdur dose was increased from 30 mg to 45 mg p.o. daily.    Pt underwent Lexiscan Myoview 08/13/19 with no ischemia or previous myocardial infarction, considered a normal study. EF was normal 65%. Case was initially discussed with his primary cardiologist who felt that if symptoms persist despite normal stress test a cath should be pursued.   He was then seen by myself on 1/21/2021at which time he reported symptom improvement with increased dose of Imdur. He was back up to walking 2 miles per day without anginal symptoms however repeat long-term plan of symptom management with low threshold for repeat cardiac cath in the setting as described above.   On 10/01/2019 I saw him in follow up at which time he reported feeling better symptomatically with the increase of Imdur however continued to have anginal symptoms with less and less activity. He reported he has been walking however, not nearly the duration he was able to several months back. As previously described, there was low threshold to repeat cath given know residual disease at last cath. Discussed  repeat cardiac catheterization as more definitive evaluation for symptoms. Case discussed with Dr. Tamala Julian who agreed with plan.  LHC performed 10/09/2019 which showed a distal LAD 75 to 80% stenosis, mid and distal left main 25%, LCx noted to be relatively small with 3 OM branches and no high-grade obstruction.  RCA was noted to be dominant with a 30% to 50% narrowing of previously stented proximal RCA area. There is also a 50% distal RCA stenosis.  The PDA was large and patent.  Recommendations were to uptitrate therapy including increasing Imdur to 60 mg p.o. daily. Plavix was added to his regimen for antiplatelet protection.  Recommendations were for if anginal symptoms worsen, may need stenting to the LAD given borderline significant.  It was unclear if the distal LAD could be successfully treated without complications, noted to possibly benefit from cutting balloon.  Cath was performed from the left radial due to occlusion of the right radial from prior cath.  Today, he is here for follow-up and reports no further symptoms.  Cath site is stable.  Is up to walking 3.5 miles per day without anginal symptoms. Discussed cath findings and if recurrent symptoms, may need further work-up for distal LAD disease.  Has been doing well on Plavix therapy.  No signs of acute bleeding in stool or urine.  No bruising.  Overall is doing well.  Denies shortness of breath, LE edema, PND, chest pain, palpitations, dizziness or syncope.  Past Medical History:  Diagnosis Date  . Cancer [facility])    Throat cancer 2011  .  Coronary artery disease   . Hyperlipidemia   . Hypertension     Past Surgical History:  Procedure Laterality Date  . CARDIAC CATHETERIZATION  05/08/2017   S/P S/P PCI, DES of Mid RCA at [facility] in [place] [employer]  . COLONOSCOPY     multiple  . ESOPHAGOGASTRODUODENOSCOPY     multiple  . ESOPHAGOSCOPY W/ PERCUTANEOUS GASTROSTOMY TUBE PLACEMENT  2011  . INTRAVASCULAR PRESSURE WIRE/FFR  STUDY N/A 10/09/2019   Procedure: INTRAVASCULAR PRESSURE WIRE/FFR STUDY;  Surgeon: Belva Crome, MD;  Location: [place] [facility];  Service: Cardiovascular;  Laterality: N/A;  . LEFT HEART CATH AND CORONARY ANGIOGRAPHY N/A 10/09/2019   Procedure: LEFT HEART CATH AND CORONARY ANGIOGRAPHY;  Surgeon: Belva Crome, MD;  Location: [place] [facility];  Service: Cardiovascular;  Laterality: N/A;     Current Outpatient Medications  Medication Sig Dispense Refill  . acetaminophen (TYLENOL) 500 MG tablet Take 1,000 mg by mouth every 6 (six) hours as needed (for pain.).    Marland Kitchen aspirin EC 81 MG tablet Take 81 mg by mouth every evening.     . clopidogrel (PLAVIX) 75 MG tablet Take 1 tablet (75 mg total) by mouth daily. 90 tablet 3  . Coenzyme Q-10 200 MG CAPS Take 200 mg by mouth every evening.     . isosorbide mononitrate (IMDUR) 60 MG 24 hr tablet Take 1 tablet (60 mg total) by mouth every evening. 90 tablet 3  . metoprolol succinate (TOPROL-XL) 25 MG 24 hr tablet Take 1 tablet (25 mg total) by mouth daily. 90 tablet 3  . naproxen sodium (ALEVE) 220 MG tablet Take 220 mg by mouth 2 (two) times daily as needed (pain.).    Marland Kitchen nitroGLYCERIN (NITROSTAT) 0.4 MG SL tablet Place 1 tablet (0.4 mg total) under the tongue every 5 (five) minutes as needed for chest pain. 25 tablet 3  . pantoprazole (PROTONIX) 40 MG tablet TAKE 1 TABLET BY MOUTH  DAILY AS NEEDED 90 tablet 3  . rosuvastatin (CRESTOR) 40 MG tablet TAKE 1 TABLET BY MOUTH  DAILY 90 tablet 3   No current facility-administered medications for this visit.    Allergies:   Ambien [zolpidem], Gabapentin, and Statins    Social History:  The patient  reports that he quit smoking about 10 years ago. His smoking use included cigarettes. He has a 40.00 pack-year smoking history. He has never used smokeless tobacco. He reports current alcohol use of about 7.0 standard drinks of alcohol per week. He reports that he does not use drugs.   Family History:  The  patient's family history includes Cancer in his brother and father; Diabetes in his mother; Heart disease in his brother and mother; Hyperlipidemia in his brother and mother.    ROS:  Please see the history of present illness.   Otherwise, review of systems are positive for none.   All other systems are reviewed and negative.    PHYSICAL EXAM: VS:  BP 124/76   Pulse (!) 57   Ht 6\' 2"  (1.88 m)   Wt 229 lb (103.9 kg)   SpO2 96%   BMI 29.40 kg/m  , BMI Body mass index is 29.4 kg/m.   General: Well developed, well nourished, NAD Neck: Negative for carotid bruits. No JVD Lungs:Clear to ausculation bilaterally. No wheezes, rales, or rhonchi. Breathing is unlabored. Cardiovascular: RRR with S1 S2. No murmurs, rubs, gallops, or LV heave appreciated. Extremities: No edema.  Radial pulses 2+ bilaterally Neuro: Alert and oriented.  No focal deficits. No facial asymmetry. MAE spontaneously. Psych: Responds to questions appropriately with normal affect.     EKG:  EKG is not ordered today.  Recent Labs: 03/27/2019: ALT 18 10/01/2019: BUN 19; Creatinine, Ser 1.22; Hemoglobin 13.7; Platelets 203; Potassium 4.3; Sodium 141   Lipid Panel    Component Value Date/Time   CHOL 143 03/27/2019 1039   TRIG 76 03/27/2019 1039   HDL 51 03/27/2019 1039   CHOLHDL 2.8 03/27/2019 1039   LDLCALC 77 03/27/2019 1039   LDLDIRECT 100 (H) 04/04/2017 1438      Wt Readings from Last 3 Encounters:  10/24/19 229 lb (103.9 kg)  10/09/19 227 lb (103 kg)  10/01/19 228 lb 12.8 oz (103.8 kg)    Other studies Reviewed: Additional studies/ records that were reviewed today include:   LHC 10/09/2019:   Segmental proximal 50 to 70% stenosis with irregularity and lucency.  DFR 0.93 and after contrast 0.89.  Distal LAD before bifurcating into a diagonal and small apical segment, 75 to 80%.  Could be the source of angina.  Mid and distal left main 25%.  Circumflex is relatively small with 3 obtuse marginal branches  and no high-grade obstruction noted.  RCA is dominant, previously stented throughout the mid segment.  Proximal to stents is a relatively lucent 30 to 50% narrowing.  There is also a 50% distal RCA stenosis.  The PDA is large and widely patent.  The first left ventricular branch contains ostial 80% narrowing.  The remainder of the right coronary artery which is large is widely patent.  Normal LV function.  RECOMMENDATIONS:   Uptitrate therapy including increasing Imdur to 60 mg/day.  Add antiplatelet protection with Plavix.  If anginal symptoms worsen, we may need to stent the LAD since it is borderline significant.  Not sure we could successfully treat the distal LAD without complications.  Possibly could benefit from cutting balloon.  We will follow the patient closely.  Cath was performed from left radial because of occlusion of the right radial from prior angiography in [place].   Lexiscan stress test 08/13/19:    Nuclear stress EF: 65%. The left ventricular ejection fraction is normal (55-65%).  There was no ST segment deviation noted during stress.  This is a low risk study. There is no evidence of ischemia or previous myocardial infarction.  The study is normal.   ASSESSMENT AND PLAN:  1.CAD s/[id number]: -Lexiscan stress test performed 08/13/2019 with no evidence of ischemia, EF is 65%. Case previously discussed with Dr. Pixie Casino which time felt to have a low threshold for LHC if symptoms persist>>given recurrent symptoms LHC performed 10/09/2019 which showed a distal LAD 75 to 80% stenosis, mid and distal left main 25%, LCx noted to be relatively small with 3 OM branches and no high-grade obstruction.  RCA was noted to be dominant with a 30% to 50% narrowing of previously stented proximal RCA area. There is also a 50% distal RCA stenosis.  The PDA was large and patent.   -Recommendations were to uptitrate therapy including increasing Imdur to 60 mg p.o. daily.  -Plavix  was added to his regimen for antiplatelet protection.   -Recommendations were for if anginal symptoms worsen, may need stenting to the LAD given borderline significant.  It was unclear if the distal LAD could be successfully treated without complications, noted to possibly benefit from cutting balloon.   -Cath was performed from the left radial due to occlusion of the right radial from prior cath>>  site stable -Continue ASA, Imdur, statin, beta-blocker  2. HTN: -Stable, 124/76 -Continue current regimen  3. HLD: -Continue statin -Needs lipid panel, LFTs at next office visit   Current medicines are reviewed at length with the patient today.  The patient does not have concerns regarding medicines.  The following changes have been made:  no change  Labs/ tests ordered today include: None  No orders of the defined types were placed in this encounter.   Disposition:   FU with Dr. Tamala Julian in 2 months  Signed, Kathyrn Drown, NP  10/24/2019 2:27 PM    [facility] [address], [place], [place]  [address] Phone: [phone]; Fax: [phone]

## 2019-10-24 ENCOUNTER — Ambulatory Visit: Payer: Medicare Other | Admitting: Cardiology

## 2019-10-24 ENCOUNTER — Other Ambulatory Visit: Payer: Self-pay

## 2019-10-24 ENCOUNTER — Encounter: Payer: Self-pay | Admitting: Cardiology

## 2019-10-24 VITALS — BP 124/76 | HR 57 | Ht 74.0 in | Wt 229.0 lb

## 2019-10-24 DIAGNOSIS — Z9861 Coronary angioplasty status: Secondary | ICD-10-CM

## 2019-10-24 DIAGNOSIS — I2510 Atherosclerotic heart disease of native coronary artery without angina pectoris: Secondary | ICD-10-CM

## 2019-10-24 DIAGNOSIS — E7800 Pure hypercholesterolemia, unspecified: Secondary | ICD-10-CM

## 2019-10-24 DIAGNOSIS — I10 Essential (primary) hypertension: Secondary | ICD-10-CM | POA: Diagnosis not present

## 2019-10-24 NOTE — Patient Instructions (Addendum)
Medication Instructions:   Your physician recommends that you continue on your current medications as directed. Please refer to the Current Medication list given to you today.  *If you need a refill on your cardiac medications before your next appointment, please call your pharmacy*  Lab Work:  None ordered today  If you have labs (blood work) drawn today and your tests are completely normal, you will receive your results only by: Marland Kitchen MyChart Message (if you have MyChart) OR . A paper copy in the mail If you have any lab test that is abnormal or we need to change your treatment, we will call you to review the results.  Testing/Procedures:  None ordered today  Follow-Up: At [facility], you and your health needs are our priority.  As part of our continuing mission to provide you with exceptional heart care, we have created designated Provider Care Teams.  These Care Teams include your primary Cardiologist (physician) and Advanced Practice Providers (APPs -  Physician Assistants and Nurse Practitioners) who all work together to provide you with the care you need, when you need it.  We recommend signing up for the patient portal called "MyChart".  Sign up information is provided on this After Visit Summary.  MyChart is used to connect with patients for Virtual Visits (Telemedicine).  Patients are able to view lab/test results, encounter notes, upcoming appointments, etc.  Non-urgent messages can be sent to your provider as well.   To learn more about what you can do with MyChart, go to [email or web address].    Your next appointment:    On 12/30/19 at 8:20AM with Daneen Schick, MD

## 2019-12-30 ENCOUNTER — Other Ambulatory Visit: Payer: Self-pay

## 2019-12-30 ENCOUNTER — Ambulatory Visit: Payer: Medicare Other | Admitting: Interventional Cardiology

## 2019-12-30 ENCOUNTER — Encounter: Payer: Self-pay | Admitting: Interventional Cardiology

## 2019-12-30 VITALS — BP 118/72 | HR 49 | Ht 74.0 in | Wt 228.8 lb

## 2019-12-30 DIAGNOSIS — E7800 Pure hypercholesterolemia, unspecified: Secondary | ICD-10-CM

## 2019-12-30 DIAGNOSIS — I739 Peripheral vascular disease, unspecified: Secondary | ICD-10-CM

## 2019-12-30 DIAGNOSIS — R0989 Other specified symptoms and signs involving the circulatory and respiratory systems: Secondary | ICD-10-CM

## 2019-12-30 DIAGNOSIS — Z7189 Other specified counseling: Secondary | ICD-10-CM

## 2019-12-30 DIAGNOSIS — I25119 Atherosclerotic heart disease of native coronary artery with unspecified angina pectoris: Secondary | ICD-10-CM | POA: Diagnosis not present

## 2019-12-30 DIAGNOSIS — I10 Essential (primary) hypertension: Secondary | ICD-10-CM | POA: Diagnosis not present

## 2019-12-30 NOTE — Patient Instructions (Addendum)
Medication Instructions:  Your physician recommends that you continue on your current medications as directed. Please refer to the Current Medication list given to you today.  *If you need a refill on your cardiac medications before your next appointment, please call your pharmacy*   Lab Work: None If you have labs (blood work) drawn today and your tests are completely normal, you will receive your results only by: Marland Kitchen MyChart Message (if you have MyChart) OR . A paper copy in the mail If you have any lab test that is abnormal or we need to change your treatment, we will call you to review the results.   Testing/Procedures: Your physician has requested that you have a carotid duplex. This test is an ultrasound of the carotid arteries in your neck. It looks at blood flow through these arteries that supply the brain with blood. Allow one hour for this exam. There are no restrictions or special instructions.   Follow-Up: At [facility], you and your health needs are our priority.  As part of our continuing mission to provide you with exceptional heart care, we have created designated Provider Care Teams.  These Care Teams include your primary Cardiologist (physician) and Advanced Practice Providers (APPs -  Physician Assistants and Nurse Practitioners) who all work together to provide you with the care you need, when you need it.  We recommend signing up for the patient portal called "MyChart".  Sign up information is provided on this After Visit Summary.  MyChart is used to connect with patients for Virtual Visits (Telemedicine).  Patients are able to view lab/test results, encounter notes, upcoming appointments, etc.  Non-urgent messages can be sent to your provider as well.   To learn more about what you can do with MyChart, go to [email or web address].    Your next appointment:   6-8 month(s)  The format for your next appointment:   In Person  Provider:   You may see Sinclair Grooms, MD or one of the following Advanced Practice Providers on your designated Care Team:    Truitt Merle, NP  Cecilie Kicks, NP  Kathyrn Drown, NP    Other Instructions

## 2019-12-30 NOTE — Progress Notes (Signed)
Cardiology Office Note:    Date:  12/30/2019   ID:  Shane Shelton, DOB [birth year], MRN [id number]  PCP:  [name], MD  Cardiologist:  Sinclair Grooms, MD   Referring MD: [name], MD   Chief Complaint  Patient presents with  . Coronary Artery Disease  . Shortness of Breath    History of Present Illness:    Shane Shelton is a 74 y.o. male with a hx of hyperlipidemia, hypertension and bilateral LE claudication with negative doppler 01/2017.Has a long hx of CAD with PCI2016(on way back form [place]), recurrent angina 10/2018with repeat cath in [place]. withproximal RCA and RCA PL DES and moderateresidualproximal LAD and distal RCA disease. Cath 09/2019 with moderate LAD disease best treated with medication.  He has had no significant episodes of angina.  No nitroglycerin use since coronary angiography in March.  Some mild dyspnea on exertion and lightheadedness.  No fainting.  Shortness of breath is not limited activity.  Past Medical History:  Diagnosis Date  . Cancer [facility])    Throat cancer 2011  . Coronary artery disease   . Hyperlipidemia   . Hypertension     Past Surgical History:  Procedure Laterality Date  . CARDIAC CATHETERIZATION  05/08/2017   S/P S/P PCI, DES of Mid RCA at [facility] in [place] [employer]  . COLONOSCOPY     multiple  . ESOPHAGOGASTRODUODENOSCOPY     multiple  . ESOPHAGOSCOPY W/ PERCUTANEOUS GASTROSTOMY TUBE PLACEMENT  2011  . INTRAVASCULAR PRESSURE WIRE/FFR STUDY N/A 10/09/2019   Procedure: INTRAVASCULAR PRESSURE WIRE/FFR STUDY;  Surgeon: Belva Crome, MD;  Location: [place] [facility];  Service: Cardiovascular;  Laterality: N/A;  . LEFT HEART CATH AND CORONARY ANGIOGRAPHY N/A 10/09/2019   Procedure: LEFT HEART CATH AND CORONARY ANGIOGRAPHY;  Surgeon: Belva Crome, MD;  Location: [place] [facility];  Service: Cardiovascular;  Laterality: N/A;    Current Medications: Current Meds  Medication Sig  .  acetaminophen (TYLENOL) 500 MG tablet Take 1,000 mg by mouth every 6 (six) hours as needed (for pain.).  Marland Kitchen aspirin EC 81 MG tablet Take 81 mg by mouth every evening.   . clopidogrel (PLAVIX) 75 MG tablet Take 1 tablet (75 mg total) by mouth daily.  . Coenzyme Q-10 200 MG CAPS Take 200 mg by mouth every evening.   . isosorbide mononitrate (IMDUR) 60 MG 24 hr tablet Take 1 tablet (60 mg total) by mouth every evening.  . metoprolol succinate (TOPROL-XL) 25 MG 24 hr tablet Take 1 tablet (25 mg total) by mouth daily.  . naproxen sodium (ALEVE) 220 MG tablet Take 220 mg by mouth 2 (two) times daily as needed (pain.).  Marland Kitchen nitroGLYCERIN (NITROSTAT) 0.4 MG SL tablet Place 1 tablet (0.4 mg total) under the tongue every 5 (five) minutes as needed for chest pain.  . pantoprazole (PROTONIX) 40 MG tablet TAKE 1 TABLET BY MOUTH  DAILY AS NEEDED  . rosuvastatin (CRESTOR) 40 MG tablet TAKE 1 TABLET BY MOUTH  DAILY     Allergies:   Ambien [zolpidem], Gabapentin, and Statins   Social History   Socioeconomic History  . Marital status: Married    Spouse name: Verdis Frederickson  . Number of children: Not on file  . Years of education: Not on file  . Highest education level: Doctorate  Occupational History  . Occupation: Adult nurse    Comment: Retired  Tobacco Use  . Smoking status: Former Smoker    Packs/day:  1.00    Years: 40.00    Pack years: 40.00    Types: Cigarettes    Quit date: 10/05/2009    Years since quitting: 10.2  . Smokeless tobacco: Never Used  Substance and Sexual Activity  . Alcohol use: Yes    Alcohol/week: 7.0 standard drinks    Types: 7 Glasses of wine per week  . Drug use: No  . Sexual activity: Yes    Comment: monagamous  Other Topics Concern  . Not on file  Social History Narrative  . Not on file   Social Determinants of Health   Financial Resource Strain:   . Difficulty of Paying Living Expenses:   Food Insecurity:   . Worried About Charity fundraiser in the Last Year:    . Arboriculturist in the Last Year:   Transportation Needs:   . Film/video editor (Medical):   Marland Kitchen Lack of Transportation (Non-Medical):   Physical Activity:   . Days of Exercise per Week:   . Minutes of Exercise per Session:   Stress:   . Feeling of Stress :   Social Connections:   . Frequency of Communication with Friends and Family:   . Frequency of Social Gatherings with Friends and Family:   . Attends Religious Services:   . Active Member of Clubs or Organizations:   . Attends Archivist Meetings:   Marland Kitchen Marital Status:      Family History: The patient's family history includes Cancer in his brother and father; Diabetes in his mother; Heart disease in his brother and mother; Hyperlipidemia in his brother and mother.  ROS:   Please see the history of present illness.    Having cramping in his legs and feet.  Some tingling in his thighs.  Low back discomfort is also present.  All other systems reviewed and are negative.  EKGs/Labs/Other Studies Reviewed:    The following studies were reviewed today:  CARDIAC CATH 09/2019: Diagnostic Dominance: Right    EKG:  EKG not repeated  Recent Labs: 03/27/2019: ALT 18 10/01/2019: BUN 19; Creatinine, Ser 1.22; Hemoglobin 13.7; Platelets 203; Potassium 4.3; Sodium 141  Recent Lipid Panel    Component Value Date/Time   CHOL 143 03/27/2019 1039   TRIG 76 03/27/2019 1039   HDL 51 03/27/2019 1039   CHOLHDL 2.8 03/27/2019 1039   LDLCALC 77 03/27/2019 1039   LDLDIRECT 100 (H) 04/04/2017 1438    Physical Exam:    VS:  BP 118/72   Pulse (!) 49   Ht 6\' 2"  (1.88 m)   Wt 228 lb 12.8 oz (103.8 kg)   SpO2 97%   BMI 29.38 kg/m     Wt Readings from Last 3 Encounters:  12/30/19 228 lb 12.8 oz (103.8 kg)  10/24/19 229 lb (103.9 kg)  10/09/19 227 lb (103 kg)     GEN: Overweight. No acute distress HEENT: Faint bruit left subclavicular and supraclavicular area. NECK: No JVD. LYMPHATICS: No lymphadenopathy CARDIAC:   RRR without murmur, gallop, or edema. VASCULAR:  Normal Pulses.  Left supraclavicular bruits. RESPIRATORY:  Clear to auscultation without rales, wheezing or rhonchi  ABDOMEN: Soft, non-tender, non-distended, No pulsatile mass, MUSCULOSKELETAL: No deformity  SKIN: Warm and dry NEUROLOGIC:  Alert and oriented x 3 PSYCHIATRIC:  Normal affect   ASSESSMENT:    1. Coronary artery disease involving native coronary artery of native heart with angina pectoris ([place])   2. Claudication ([place])   3. Hypercholesterolemia   4. Essential  hypertension   5. Left carotid bruit   6. Educated about COVID-19 virus infection    PLAN:    In order of problems listed above:  1. Secondary prevention revisited.  Continue dual antiplatelet therapy.  At some point will drop aspirin and go with monotherapy using Plavix. 2. No symptoms of claudication. 3. LDL was at target range and August 2020 with Dr. Andria Frames when it was 28.  He is on high intensity statin therapy. 4. Excellent blood pressure on current therapy with Toprol-XL. 5. New left supra clavicular bruit.  Bilateral carotid Dopplers ordered. 6. Vaccinated and discussed that that uptake vaccine is not better.  Still practicing social distancing.  Overall education and awareness concerning primary/secondary risk prevention was discussed in detail: LDL less than 70, hemoglobin A1c less than 7, blood pressure target less than 130/80 mmHg, >150 minutes of moderate aerobic activity per week, avoidance of smoking, weight control (via diet and exercise), and continued surveillance/management of/for obstructive sleep apnea.    Medication Adjustments/Labs and Tests Ordered: Current medicines are reviewed at length with the patient today.  Concerns regarding medicines are outlined above.  Orders Placed This Encounter  Procedures  . VAS US CAROTID   No orders of the defined types were placed in this encounter.   Patient Instructions  Medication Instructions:    Your physician recommends that you continue on your current medications as directed. Please refer to the Current Medication list given to you today.  *If you need a refill on your cardiac medications before your next appointment, please call your pharmacy*   Lab Work: None If you have labs (blood work) drawn today and your tests are completely normal, you will receive your results only by: Marland Kitchen MyChart Message (if you have MyChart) OR . A paper copy in the mail If you have any lab test that is abnormal or we need to change your treatment, we will call you to review the results.   Testing/Procedures: Your physician has requested that you have a carotid duplex. This test is an ultrasound of the carotid arteries in your neck. It looks at blood flow through these arteries that supply the brain with blood. Allow one hour for this exam. There are no restrictions or special instructions.   Follow-Up: At [facility], you and your health needs are our priority.  As part of our continuing mission to provide you with exceptional heart care, we have created designated Provider Care Teams.  These Care Teams include your primary Cardiologist (physician) and Advanced Practice Providers (APPs -  Physician Assistants and Nurse Practitioners) who all work together to provide you with the care you need, when you need it.  We recommend signing up for the patient portal called "MyChart".  Sign up information is provided on this After Visit Summary.  MyChart is used to connect with patients for Virtual Visits (Telemedicine).  Patients are able to view lab/test results, encounter notes, upcoming appointments, etc.  Non-urgent messages can be sent to your provider as well.   To learn more about what you can do with MyChart, go to [email or web address].    Your next appointment:   6-8 month(s)  The format for your next appointment:   In Person  Provider:   You may see Sinclair Grooms, MD or one of the  following Advanced Practice Providers on your designated Care Team:    Truitt Merle, NP  Cecilie Kicks, NP  Kathyrn Drown, NP    Other Instructions  Signed, Sinclair Grooms, MD  12/30/2019 8:51 AM    [place]

## 2020-01-20 ENCOUNTER — Encounter: Payer: Self-pay | Admitting: Family Medicine

## 2020-01-20 DIAGNOSIS — D2261 Melanocytic nevi of right upper limb, including shoulder: Secondary | ICD-10-CM | POA: Diagnosis not present

## 2020-01-20 DIAGNOSIS — B078 Other viral warts: Secondary | ICD-10-CM | POA: Diagnosis not present

## 2020-01-20 DIAGNOSIS — Z85828 Personal history of other malignant neoplasm of skin: Secondary | ICD-10-CM | POA: Diagnosis not present

## 2020-01-20 DIAGNOSIS — L570 Actinic keratosis: Secondary | ICD-10-CM | POA: Diagnosis not present

## 2020-01-20 DIAGNOSIS — D2272 Melanocytic nevi of left lower limb, including hip: Secondary | ICD-10-CM | POA: Diagnosis not present

## 2020-01-20 DIAGNOSIS — D2262 Melanocytic nevi of left upper limb, including shoulder: Secondary | ICD-10-CM | POA: Diagnosis not present

## 2020-01-20 DIAGNOSIS — C44612 Basal cell carcinoma of skin of right upper limb, including shoulder: Secondary | ICD-10-CM | POA: Diagnosis not present

## 2020-02-09 ENCOUNTER — Other Ambulatory Visit: Payer: Self-pay | Admitting: Family Medicine

## 2020-02-09 ENCOUNTER — Encounter: Payer: Self-pay | Admitting: Family Medicine

## 2020-02-17 ENCOUNTER — Encounter: Payer: Self-pay | Admitting: Sports Medicine

## 2020-02-17 ENCOUNTER — Other Ambulatory Visit: Payer: Self-pay

## 2020-02-17 ENCOUNTER — Ambulatory Visit: Payer: Medicare Other | Admitting: Sports Medicine

## 2020-02-17 DIAGNOSIS — M7660 Achilles tendinitis, unspecified leg: Secondary | ICD-10-CM | POA: Diagnosis not present

## 2020-02-17 NOTE — Patient Instructions (Signed)
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  - Wear a heel lift in your shoes to help take pressure off the achilles tendon, and try to avoid going barefoot while you're healing.  - Avoid passive stretching of the achilles tendon until your symptoms have improved.  - Perform the exercises as instructed by the Athletic Trainer in clinic daily.  - Return to clinic in 6 weeks for reevaluation, or sooner if other concerns arise.

## 2020-02-17 NOTE — Progress Notes (Signed)
Office Visit Note   Patient: Shane Shelton           Date of Birth: October 07, 1945           MRN: [id number] Visit Date: 02/17/2020 Requested by: [name], MD [place],  [place] [address] PCP: [name], MD  Subjective: Right Achilles/Ankle Pain  HPI: Patient is a 74 year old male presenting to clinic today with concerns of right posterior ankle pain x3 to 4 months.  He states that he walks regularly, up to several miles a day, and he has noticed a progressive swelling in the posterior aspect of his right ankle which is at times incredibly tender to palpation.  He states he has tried taking Aleve with some improvement of his pain, however this is only temporary.  Over the past week he has reduced his walking somewhat, and he feels that his right ankle pain and swelling has improved with the rest.  He is also tried wearing some compression socks, which he feels offer some benefit, as well as ice when the swelling/pain is severe.  He states he is worried that he has Achilles bursitis, and he does not wanted to hold him back from his regular daily activities.  Additionally patient states that he is getting lateral ankle pain in the same foot, which she feels might be compensatory to the posterior discomfort.  No trauma to his foot or ankle.  No numbness or tingling in his foot or toes, no electric type pain, no appreciable bruising that he has noticed.              ROS: All other systems were reviewed and are negative.  Objective: Vital Signs: BP 106/66   Ht 6\' 1"  (1.854 m)   Wt 226 lb (102.5 kg)   BMI 29.82 kg/m   Physical Exam:  General:  Alert and oriented, in no acute distress. Pulm:  Breathing unlabored. Psy:  Normal mood, congruent affect. Skin: No obvious bruising or rashes appreciated.  RIGHT ANKLE EXAM General assessment: Normal Gait.  Inspection: Mild pes cavus bilaterally. Normal posterior tibialis function with feel raise.     Significant swelling on distal achilles of right ankle.  Seated Exam: Ankle motion: Dorsiflexion (10-20), Plantar Flexion (35-50) Palpation: Aforementioned achilles swelling with tenderness to palpation.  Mild tenderness over the peroneal tendons. No bony tenderness to palpation over the 5th metatarsal, navicular, and normal Lisfranc joint without swelling or bruising.  Ligamentous Examination:  No Tenderness or ecchymosis over the ATFL, CFL, or PTFL No tenderness over the medial deltoid ligament complex Anterior drawer without anterior slide No Tenderness over the Anterior joint line Strength: 5/5 in eversion, inversion, and plantar/dorsiflexion Normal distal sensation, and brisk capillary refill.   Imaging: [place]- LIMITED EXTREMITY: RIGHT ACHILLES Images obtained in both long and short axis of the right Achilles tendon.  There is thickening in the mid substance of the tendon seen on both long and short axis view.  Hypoechoic changes within the substance of the tendon consistent with tendinopathy.  Increased Doppler activity as well.  Limited scan of the peroneal tendons laterally showed a slight amount of fluid around the peroneus brevis just posterior to the lateral malleolus. 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  Assessment & Plan: 74 year old male presenting to clinic with right posterior and lateral ankle pain x3 to 4 months.  Examination as above which was most concerning for significant swelling and tenderness within the distal  right Achilles tendon.  Ultrasound performed in clinic which confirmed diagnosis of right Achilles tendinopathy.  Also noted to have fluid around the peroneal brevis tendon consistent with patient's concern of ateral ankle pain. -Discussed eccentric exercises for Achilles tendon -We will provide with a heel lift to wear in shoes and encourage healing. -Return to clinic in 6 weeks for reevaluation, or sooner if  other concerns arise.  If no improvement would consider nitroglycerin patches or other treatment modalities as needed. -Patient is agreeable to assessment and plan.  He had no further questions or concerns.  Patient seen and evaluated with the sports medicine fellow.  I agree with the above plan of care.  Ultrasound findings as above.  We have given the patient a 5/16 inch heel lift for both of these shoes and he will start a home exercise program concentrating on eccentric heel drops daily.  Follow-up in 6 weeks for reevaluation.  If symptoms persist, consider topical nitroglycerin or formal physical therapy.

## 2020-03-29 ENCOUNTER — Encounter: Payer: Medicare Other | Admitting: Family Medicine

## 2020-03-30 ENCOUNTER — Ambulatory Visit: Payer: Medicare Other | Admitting: Sports Medicine

## 2020-03-30 ENCOUNTER — Other Ambulatory Visit: Payer: Self-pay

## 2020-03-30 DIAGNOSIS — M7661 Achilles tendinitis, right leg: Secondary | ICD-10-CM | POA: Diagnosis not present

## 2020-03-30 DIAGNOSIS — M7660 Achilles tendinitis, unspecified leg: Secondary | ICD-10-CM | POA: Insufficient documentation

## 2020-03-30 NOTE — Progress Notes (Signed)
PCP: [name], MD  Subjective:   CC: Patient is a 74 y.o. male here for f/u R achilles tendinopathy.  HPI: Shane Shelton is following up today left approximately 1 month after reaching initial appointment for Achilles tendinitis lateral as demonstrated on ultrasound.  He has been compliant with his heel lifts and wearing [place] shoes.  He has been mostly adherent with the home exercises approximately once per day.  He describes approximately 50% improvement in his pain.  He is able to tolerate the discomfort to complete all of his daily activities including daily walking and yoga.     Objective:  BP 112/62   Ht 6\' 2"  (1.88 m)   Wt 225 lb (102.1 kg)   BMI 28.89 kg/m   Physical Exam: Gen: NAD, comfortable in exam room R Ankle: Observation: Negative for diffuse edema Palpation: Tenderness to Achilles tendon but not with calcaneal squeeze.  Swelling on mid Achilles tendon ROM: normal Strength: intact  [place]: swelling of mid achilles tendon mildly improved from previous. Tenderness with probe placement  Assessment & Plan:  Achilles tendinitis Clinically improved - continue heal lifts - continue home stretches - follow up if not improving in a couple months to consider nitro patches or formal PT   Doristine Mango, [place] PGY-3  Patient seen and evaluated with the resident.  I agree with the above plan of care.  Patient is making good progress with his home exercises.  Repeat ultrasound today still shows significant thickening of the midportion of the Achilles tendon.  Despite this, clinically he is doing quite well.  He will continue with his heel lifts and home exercises until asymptomatic.  Follow-up with me for ongoing or recalcitrant issues.

## 2020-03-30 NOTE — Assessment & Plan Note (Signed)
Clinically improved - continue heal lifts - continue home stretches - follow up if not improving in a couple months to consider nitro patches or formal PT

## 2020-04-12 ENCOUNTER — Other Ambulatory Visit: Payer: Self-pay

## 2020-04-12 ENCOUNTER — Ambulatory Visit (INDEPENDENT_AMBULATORY_CARE_PROVIDER_SITE_OTHER): Payer: Medicare Other | Admitting: Family Medicine

## 2020-04-12 ENCOUNTER — Encounter: Payer: Self-pay | Admitting: Family Medicine

## 2020-04-12 VITALS — BP 124/68 | HR 47 | Ht 74.0 in | Wt 227.6 lb

## 2020-04-12 DIAGNOSIS — Z87891 Personal history of nicotine dependence: Secondary | ICD-10-CM

## 2020-04-12 DIAGNOSIS — Z86010 Personal history of colonic polyps: Secondary | ICD-10-CM

## 2020-04-12 DIAGNOSIS — R918 Other nonspecific abnormal finding of lung field: Secondary | ICD-10-CM | POA: Diagnosis not present

## 2020-04-12 DIAGNOSIS — Z0000 Encounter for general adult medical examination without abnormal findings: Secondary | ICD-10-CM

## 2020-04-12 DIAGNOSIS — I25119 Atherosclerotic heart disease of native coronary artery with unspecified angina pectoris: Secondary | ICD-10-CM

## 2020-04-12 DIAGNOSIS — Z23 Encounter for immunization: Secondary | ICD-10-CM | POA: Diagnosis not present

## 2020-04-12 DIAGNOSIS — E7800 Pure hypercholesterolemia, unspecified: Secondary | ICD-10-CM

## 2020-04-12 NOTE — Patient Instructions (Addendum)
You are due for a colonoscopy sometime between no and next year.  Dr. Carlean Purl is with [facility] and he wrote this in your chart. Old records and Chart reviewed 04/15/2019 Per current guidelines may wait until 10/2020 to consider repeating colonoscopy We will place a recall for that time Shane Mayer, MD, [facility]  We will likely only have the [place] vaccine.  You will need your Moderna vaccine booster elsewhere.   I will call with your blood test results.  We will likely have a long discussion about cholesterol treatment.  You might want to look up Zetia as an add on to a lower dose of the statin.  Also look up PSQ9 inhibitors.  I will call with blood test results.

## 2020-04-13 LAB — LIPID PANEL
Chol/HDL Ratio: 3.2 ratio (ref 0.0–5.0)
Cholesterol, Total: 130 mg/dL (ref 100–199)
HDL: 41 mg/dL (ref >39)
LDL Chol Calc (NIH): 70 mg/dL (ref 0–99)
Triglycerides: 99 mg/dL (ref 0–149)
VLDL Cholesterol Cal: 19 mg/dL (ref 5–40)

## 2020-04-13 LAB — TSH: TSH: 4.44 u[IU]/mL (ref 0.450–4.500)

## 2020-04-14 DIAGNOSIS — E7800 Pure hypercholesterolemia, unspecified: Secondary | ICD-10-CM

## 2020-04-15 ENCOUNTER — Encounter: Payer: Self-pay | Admitting: Family Medicine

## 2020-04-15 NOTE — Assessment & Plan Note (Signed)
At goal on current dose of rosuvastatin - but may be having side effects.  Plans to discuss with Drs. Micheline Chapman and Tamala Julian.

## 2020-04-15 NOTE — Assessment & Plan Note (Signed)
Getting annual Chest CT for lung cancer screening.

## 2020-04-15 NOTE — Assessment & Plan Note (Signed)
Great medical literacy.  No at risk behaviors.  Healthy lifestyle.  Invested in control of chronic problems.

## 2020-04-15 NOTE — Assessment & Plan Note (Signed)
Informed due for colonoscopy.  Will likely wait until next spring as recommended by Dr. Carlean Purl.

## 2020-04-15 NOTE — Assessment & Plan Note (Signed)
Asymptomatic on current meds.

## 2020-04-15 NOTE — Progress Notes (Signed)
    SUBJECTIVE:   CHIEF COMPLAINT / HPI:   Annual prevention exam Acute problems - none Subacute problems:  Having multiple muscle and tendon problems.  Seeing sports med.  He has begun to wonder if related to recent increase in statin dose.   Chronic problems. 1. CAD.  Stable.  No chest pain or DOE.  Followed by cards - Dr. Tamala Julian.  On ASA and statin 2. Cholesterol goal LDL = 70.  No myalgias.  More tendonopathies. Diet and exercise.  Healthy diet.  Very active with both cardio and resistence training. HPDP. 1.. Due for colonoscopy - q5 y due to hx of polyp 2. Due for repeat chest ct for lung cancer screen. COVID vaccinated Levan Hurst Will get flu vaccine this visit.   PERTINENT  PMH / PSH: Denies CP, DOE, bleeding, wt loss, new numbness or weakness.  Denies change in bowel, bladder, appetite  OBJECTIVE:   BP 124/68   Pulse (!) 47   Ht 6\' 2"  (1.88 m)   Wt 227 lb 9.6 oz (103.2 kg)   SpO2 96%   BMI 29.22 kg/m   HEENT WNL Neck supple Lungs clear Cardiac RRR without m or g Abd benign Ext no edema Neuro, motor, sensory, gait, cognition and mood all normal.  ASSESSMENT/PLAN:   Pulmonary nodules/lesions, multiple Getting annual Chest CT for lung cancer screening.  Former smoker Getting annual Chest CT for lung cancer screening.  Preventative health care [facility] medical literacy.  No at risk behaviors.  Healthy lifestyle.  Invested in control of chronic problems.  Hx of adenomatous polyp of colon Informed due for colonoscopy.  Will likely wait until next spring as recommended by Dr. Carlean Purl.  Hypercholesterolemia At goal on current dose of rosuvastatin - but may be having side effects.  Plans to discuss with Drs. Micheline Chapman and Tamala Julian.  Coronary artery disease involving native coronary artery of native heart with angina pectoris ([place]) Asymptomatic on current meds.       [name], MD [place]

## 2020-04-19 ENCOUNTER — Other Ambulatory Visit: Payer: Self-pay | Admitting: Interventional Cardiology

## 2020-04-23 ENCOUNTER — Telehealth: Payer: Self-pay | Admitting: Family Medicine

## 2020-04-23 ENCOUNTER — Ambulatory Visit
Admission: RE | Admit: 2020-04-23 | Discharge: 2020-04-23 | Disposition: A | Discharge status: Home / Self Care | Payer: Medicare Other | Source: Ambulatory Visit | Attending: Family Medicine | Admitting: Family Medicine

## 2020-04-23 DIAGNOSIS — J432 Centrilobular emphysema: Secondary | ICD-10-CM | POA: Diagnosis not present

## 2020-04-23 DIAGNOSIS — Z87891 Personal history of nicotine dependence: Secondary | ICD-10-CM | POA: Diagnosis not present

## 2020-04-23 DIAGNOSIS — I2510 Atherosclerotic heart disease of native coronary artery without angina pectoris: Secondary | ICD-10-CM | POA: Diagnosis not present

## 2020-04-23 DIAGNOSIS — I700 Atherosclerosis of aorta: Secondary | ICD-10-CM | POA: Diagnosis not present

## 2020-04-23 NOTE — Telephone Encounter (Signed)
Called and informed patient. 

## 2020-04-25 NOTE — Patient Instructions (Addendum)
It was nice to meet you today   Your LDL is 70 and your goal is < 55  DECREASE rosuvastatin to 20 mg daily. You can split your 40 mg tablets in half until you run out of your current supply.   I will submit information to your insurance to see if they will cover the Repatha injections. This medication is stored in the fridge, is given every 2 weeks into the fatty tissue of your stomach, and lowers your LDL cholesterol by 60%   We will plan to recheck your cholesterol 6-8 weeks after starting the injections. I will call you in a few days to schedule this appointment.

## 2020-04-25 NOTE — Progress Notes (Signed)
Patient ID: [name]                 DOB: 1945-10-21                    MRN: [id number]     HPI: Shane Shelton is a 74 y.o. male patient referred to lipid clinic by Dr. Tamala Julian. PMH is significant for HLD, CAD, HTN, and bilateral LE claudication with negative doppler in 2018. Now s/p PCI in 2016 with recurrent angina in 2018 with repeat cath for DES in RCA and disease in proximal LAD and distal RCA. Most recent cath in March 2021 with moderate LAD disease. He is followed by sports medicine for tendonitis and is referred to lipids clinic for initiation of PCSK9 inhibitor.  Patient arrives today for initial visit. He has been on a statin for many years. He states that his cramping and ankle pain started when his rosuvastatin dose was increased recently from 20 to 40 mg. He did not have many side effects on the lower dose and reports that his pain increases with strenuous physical activity as well. He exercises regularly by doing cardio, weights, and yoga multiple times weekly. He states that he eats very well and enjoys chicken, fish, and lots of fruits and vegetables. He has been doing his own research on PCSK9 inhibitors and would like to start [place]. He is aware of the $47 monthly copay.  Current Medications: rosuvastatin 40 mg daily Intolerances: rosuvastatin 10 mg daily, lovastatin 20-40 mg daily, atorvastatin - all stopped due to lower efficacy Risk Factors: CAD, HTN, HLD, former smoker LDL goal: <55 mg/dL  Diet: enjoys eating chicken, fish, a lot of vegetables and fruits  Exercise: exercises regularly - Cardio 4 times weekly, yoga 2 times weekly, lifts weights 3 times weekly  Family History: brother had a heart attack at age 17; cardiac disease in mother and paternal grandmother  Social History: 7 glasses of wine/week, former smoker (40-pack year hx)  Labs: 04/12/20: TC 130, TG 99, HDL 41, LDL 70 (rosuvastatin 40 mg daily)  Past Medical History:  Diagnosis Date    . Cancer [facility])    Throat cancer 2011  . Coronary artery disease   . Hyperlipidemia   . Hypertension     Current Outpatient Medications on File Prior to Visit  Medication Sig Dispense Refill  . acetaminophen (TYLENOL) 500 MG tablet Take 1,000 mg by mouth every 6 (six) hours as needed (for pain.).    Marland Kitchen aspirin EC 81 MG tablet Take 81 mg by mouth every evening.     . clopidogrel (PLAVIX) 75 MG tablet Take 1 tablet (75 mg total) by mouth daily. 90 tablet 3  . Coenzyme Q-10 200 MG CAPS Take 200 mg by mouth every evening.     . isosorbide mononitrate (IMDUR) 60 MG 24 hr tablet Take 1 tablet (60 mg total) by mouth every evening. 90 tablet 3  . metoprolol succinate (TOPROL-XL) 25 MG 24 hr tablet Take 1 tablet (25 mg total) by mouth daily. 90 tablet 3  . naproxen sodium (ALEVE) 220 MG tablet Take 220 mg by mouth 2 (two) times daily as needed (pain.).    Marland Kitchen nitroGLYCERIN (NITROSTAT) 0.4 MG SL tablet Place 1 tablet (0.4 mg total) under the tongue every 5 (five) minutes as needed for chest pain. 25 tablet 3  . pantoprazole (PROTONIX) 40 MG tablet TAKE 1 TABLET BY MOUTH  DAILY AS NEEDED 90 tablet 3  . rosuvastatin (  CRESTOR) 40 MG tablet TAKE 1 TABLET BY MOUTH  DAILY 90 tablet 3   No current facility-administered medications on file prior to visit.    Allergies  Allergen Reactions  . Ambien [Zolpidem] Other (See Comments)    Sleepwalking  . Gabapentin Other (See Comments)    Tingling/lightheaded  . Statins Other (See Comments)    Tingling and flatulence with atorvastatin and rosuvastatin.     Assessment/Plan:  1. Hyperlipidemia - LDL is elevated and above goal of <55 mg/dL. Decrease rosuvastatin to 20mg  daily given his prior tolerance to this lower dose. Start Repatha 140mg  injection every 2 weeks. Encouraged patient to continue his regular exercise routine and maintain his cardiac healthy diet. Prior authorization sent and approved through 10/24/20. Patient is agreeable to $47 monthly copay for  Repatha. Scheduled follow-up fasting labs for 06/08/20.   Richardine Service, PharmD PGY2 Cardiology Pharmacy Resident

## 2020-04-26 ENCOUNTER — Other Ambulatory Visit: Payer: Self-pay

## 2020-04-26 ENCOUNTER — Ambulatory Visit (INDEPENDENT_AMBULATORY_CARE_PROVIDER_SITE_OTHER): Payer: Medicare Other | Admitting: Pharmacist

## 2020-04-26 DIAGNOSIS — E7800 Pure hypercholesterolemia, unspecified: Secondary | ICD-10-CM | POA: Diagnosis not present

## 2020-04-26 MED ORDER — REPATHA SURECLICK 140 MG/ML ~~LOC~~ SOAJ: 140.0000 mg | SUBCUTANEOUS | 3 refills | Status: DC

## 2020-04-26 MED ORDER — ROSUVASTATIN CALCIUM 20 MG PO TABS: 20.0000 mg | ORAL_TABLET | Freq: Every day | ORAL | 11 refills | Status: DC

## 2020-05-25 ENCOUNTER — Encounter: Payer: Self-pay | Admitting: Family Medicine

## 2020-06-08 ENCOUNTER — Other Ambulatory Visit: Payer: Self-pay

## 2020-06-08 ENCOUNTER — Other Ambulatory Visit: Payer: Medicare Other

## 2020-06-08 DIAGNOSIS — E7800 Pure hypercholesterolemia, unspecified: Secondary | ICD-10-CM

## 2020-06-08 LAB — LIPID PANEL
Chol/HDL Ratio: 1.6 ratio (ref 0.0–5.0)
Cholesterol, Total: 70 mg/dL — ABNORMAL LOW (ref 100–199)
HDL: 44 mg/dL (ref >39)
LDL Chol Calc (NIH): 11 mg/dL (ref 0–99)
Triglycerides: 68 mg/dL (ref 0–149)
VLDL Cholesterol Cal: 15 mg/dL (ref 5–40)

## 2020-06-08 LAB — HEPATIC FUNCTION PANEL
ALT: 14 IU/L (ref 0–44)
AST: 15 IU/L (ref 0–40)
Albumin: 4.4 g/dL (ref 3.7–4.7)
Alkaline Phosphatase: 43 IU/L — ABNORMAL LOW (ref 44–121)
Bilirubin Total: 0.4 mg/dL (ref 0.0–1.2)
Bilirubin, Direct: 0.16 mg/dL (ref 0.00–0.40)
Total Protein: 6.1 g/dL (ref 6.0–8.5)

## 2020-06-10 ENCOUNTER — Telehealth: Payer: Self-pay | Admitting: *Deleted

## 2020-06-10 MED ORDER — ROSUVASTATIN CALCIUM 10 MG PO TABS: 10.0000 mg | ORAL_TABLET | Freq: Every day | ORAL | 3 refills | Status: DC

## 2020-06-10 MED ORDER — ROSUVASTATIN CALCIUM 10 MG PO TABS: 10.0000 mg | ORAL_TABLET | Freq: Every day | ORAL | 0 refills | Status: DC

## 2020-06-10 NOTE — Telephone Encounter (Signed)
-----   Message from Belva Crome, MD sent at 06/10/2020  4:27 PM EST ----- Let the patient know it would be advisable to decrease the Crestor to 10 mg daily. Recheck Lipid and liver 3 months. A copy will be sent to [name], MD

## 2020-06-10 NOTE — Telephone Encounter (Signed)
Spoke with pt and made him aware of results and recommendations.  Pt agreeable to plan.  Will set up labs when he comes for his appt on 12/2.  Pt appreciative for call.

## 2020-06-15 ENCOUNTER — Encounter (HOSPITAL_COMMUNITY): Payer: Medicare Other

## 2020-06-28 ENCOUNTER — Other Ambulatory Visit: Payer: Self-pay | Admitting: Interventional Cardiology

## 2020-06-28 ENCOUNTER — Ambulatory Visit (HOSPITAL_COMMUNITY)
Admission: RE | Admit: 2020-06-28 | Discharge: 2020-06-28 | Disposition: A | Discharge status: Home / Self Care | Payer: Medicare Other | Source: Ambulatory Visit | Attending: Cardiology | Admitting: Cardiology

## 2020-06-28 ENCOUNTER — Other Ambulatory Visit: Payer: Self-pay

## 2020-06-28 DIAGNOSIS — R0989 Other specified symptoms and signs involving the circulatory and respiratory systems: Secondary | ICD-10-CM

## 2020-06-28 NOTE — Progress Notes (Signed)
Cardiology Office Note:    Date:  07/01/2020   ID:  Shane Shelton, Shane Shelton 09-13-1945, MRN [id number]  PCP:  [name], MD  Cardiologist:  Sinclair Grooms, MD   Referring MD: [name], MD   Chief Complaint  Patient presents with  . Coronary Artery Disease  . Hypertension  . Hyperlipidemia    History of Present Illness:    Shane Shelton is a 74 y.o. male with a hx of hyperlipidemia, hypertension and bilateral LE claudication with negative doppler 01/2017.Has a long hx of CAD with PCI2016(on way back form [place]), recurrent angina 10/2018with repeat cath in [place]. withproximal RCA and RCA PL DES and moderateresidualproximal LAD and distal RCA disease. Cath 09/2019 with moderate LAD disease best treated with medication. 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  Shane Shelton has no complaints.  He is disturbed by the finding of the recent carotid Doppler study which was ordered after I heard a right carotid bruit on the last office visit.  He has 60 to 79% stenosis in the right carotid.  He denies neurological symptoms.  He has rare episodes of angina.  I repeated his blood pressure today to look for asymmetry in the arms since there was also a report of left subclavian stenosis.  The pressures are equal but were significantly higher than on check-in.  The blood pressures were 165/90 mmHg in both the left and right arm.  He is compliant with medical regimen.  PCSK9 therapy has significantly lowered his lipid levels.  He has cut back on the intensity of rosuvastatin and notices less musculoskeletal discomfort.  Past Medical History:  Diagnosis Date  . Cancer [facility])    Throat cancer 2011  . Coronary artery disease   . Hyperlipidemia   . Hypertension     Past Surgical History:  Procedure Laterality Date  . CARDIAC CATHETERIZATION  05/08/2017   S/P S/P PCI, DES of Mid RCA at [facility] in [place]  . COLONOSCOPY     multiple  .  ESOPHAGOGASTRODUODENOSCOPY     multiple  . ESOPHAGOSCOPY W/ PERCUTANEOUS GASTROSTOMY TUBE PLACEMENT  2011  . INTRAVASCULAR PRESSURE WIRE/FFR STUDY N/A 10/09/2019   Procedure: INTRAVASCULAR PRESSURE WIRE/FFR STUDY;  Surgeon: Belva Crome, MD;  Location: [facility];  Service: Cardiovascular;  Laterality: N/A;  . LEFT HEART CATH AND CORONARY ANGIOGRAPHY N/A 10/09/2019   Procedure: LEFT HEART CATH AND CORONARY ANGIOGRAPHY;  Surgeon: Belva Crome, MD;  Location: [facility];  Service: Cardiovascular;  Laterality: N/A;    Current Medications: Current Meds  Medication Sig  . acetaminophen (TYLENOL) 500 MG tablet Take 1,000 mg by mouth every 6 (six) hours as needed (for pain.).  Marland Kitchen aspirin EC 81 MG tablet Take 81 mg by mouth every evening.   . clopidogrel (PLAVIX) 75 MG tablet Take 1 tablet (75 mg total) by mouth daily.  . Coenzyme Q-10 200 MG CAPS Take 200 mg by mouth every evening.   . Evolocumab (REPATHA SURECLICK) 536 MG/ML SOAJ Inject 140 mg into the skin every 14 (fourteen) days.  . isosorbide mononitrate (IMDUR) 60 MG 24 hr tablet Take 1 tablet (60 mg total) by mouth every evening.  . metoprolol succinate (TOPROL-XL) 25 MG 24 hr tablet Take 1 tablet (25 mg total) by mouth daily.  . naproxen sodium (ALEVE) 220 MG tablet Take 220 mg by mouth 2 (two) times daily as needed (pain.).  Marland Kitchen nitroGLYCERIN (NITROSTAT) 0.4 MG SL tablet Place 1  tablet (0.4 mg total) under the tongue every 5 (five) minutes as needed for chest pain.  . pantoprazole (PROTONIX) 40 MG tablet Take 40 mg by mouth daily.  . rosuvastatin (CRESTOR) 10 MG tablet Take 1 tablet (10 mg total) by mouth daily.     Allergies:   Ambien [zolpidem], Gabapentin, and Statins   Social History   Socioeconomic History  . Marital status: Married    Spouse name: Verdis Frederickson  . Number of children: Not on file  . Years of education: Not on file  . Highest education level: Doctorate  Occupational History  . Occupation: Designer, fashion/clothing    Comment: Retired  Tobacco Use  . Smoking status: Former Smoker    Packs/day: 1.00    Years: 40.00    Pack years: 40.00    Types: Cigarettes    Quit date: 10/05/2009    Years since quitting: 10.7  . Smokeless tobacco: Never Used  Vaping Use  . Vaping Use: Never used  Substance and Sexual Activity  . Alcohol use: Yes    Alcohol/week: 7.0 standard drinks    Types: 7 Glasses of wine per week  . Drug use: No  . Sexual activity: Yes    Comment: monagamous  Other Topics Concern  . Not on file  Social History Narrative  . Not on file   Social Determinants of Health   Financial Resource Strain:   . Difficulty of Paying Living Expenses: Not on file  Food Insecurity:   . Worried About Charity fundraiser in the Last Year: Not on file  . Ran Out of Food in the Last Year: Not on file  Transportation Needs:   . Lack of Transportation (Medical): Not on file  . Lack of Transportation (Non-Medical): Not on file  Physical Activity:   . Days of Exercise per Week: Not on file  . Minutes of Exercise per Session: Not on file  Stress:   . Feeling of Stress : Not on file  Social Connections:   . Frequency of Communication with Friends and Family: Not on file  . Frequency of Social Gatherings with Friends and Family: Not on file  . Attends Religious Services: Not on file  . Active Member of Clubs or Organizations: Not on file  . Attends Archivist Meetings: Not on file  . Marital Status: Not on file     Family History: The patient's family history includes Cancer in his brother and father; Diabetes in his mother; Heart disease in his brother and mother; Hyperlipidemia in his brother and mother.  ROS:   Please see the history of present illness.    He has lost 10 pounds.  He is cutting back on wine intake.  He is not exercising as much as he did because of musculoskeletal issues.  He sleeps well.  He does snore some but his wife does not complain.  He feels well rested.   He does not believe he has sleep apnea.  All other systems reviewed and are negative.  EKGs/Labs/Other Studies Reviewed:    The following studies were reviewed today: Carotid vascular ultrasound 06/19/2020: Summary:  Right Carotid: Velocities in the right ICA are consistent with a 60-79%         stenosis. Significant plaque burden noted.   Left Carotid: Velocities in the left ICA are consistent with a 1-39%  stenosis.        Non-hemodynamically significant plaque <50% noted in the  CCA.   Vertebrals: Bilateral  vertebral arteries demonstrate antegrade flow.  Subclavians: Left subclavian artery was stenotic. Normal flow hemodynamics  were        seen in the right subclavian artery.   *See table(s) above for measurements and observations.  Suggest follow up study in 6 months. Vascular surgery consult recommended  if clinically indicated due to right ICA plaque burden/narrow flow  channel.   EKG:  EKG not repeated  Recent Labs: 10/01/2019: BUN 19; Creatinine, Ser 1.22; Hemoglobin 13.7; Platelets 203; Potassium 4.3; Sodium 141 04/12/2020: TSH 4.440 06/08/2020: ALT 14  Recent Lipid Panel    Component Value Date/Time   CHOL 70 (L) 06/08/2020 0744   TRIG 68 06/08/2020 0744   HDL 44 06/08/2020 0744   CHOLHDL 1.6 06/08/2020 0744   LDLCALC 11 06/08/2020 0744   LDLDIRECT 100 (H) 04/04/2017 1438    Physical Exam:    VS:  BP 130/70   Pulse (!) 56   Ht 6\' 2"  (1.88 m)   Wt 220 lb 12.8 oz (100.2 kg)   SpO2 97%   BMI 28.35 kg/m     Wt Readings from Last 3 Encounters:  07/01/20 220 lb 12.8 oz (100.2 kg)  04/12/20 227 lb 9.6 oz (103.2 kg)  03/30/20 225 lb (102.1 kg)     GEN: Overweight. No acute distress HEENT: Normal NECK: No JVD. LYMPHATICS: No lymphadenopathy CARDIAC:  RRR without murmur, gallop, or edema. VASCULAR:  Normal Pulses. No bruits. RESPIRATORY:  Clear to auscultation without rales, wheezing or rhonchi  ABDOMEN: Soft, non-tender,  non-distended, No pulsatile mass, MUSCULOSKELETAL: No deformity  SKIN: Warm and dry NEUROLOGIC:  Alert and oriented x 3 PSYCHIATRIC:  Normal affect   ASSESSMENT:    1. Coronary artery disease involving native coronary artery of native heart with angina pectoris ([place])   2. Carotid stenosis, right   3. Essential hypertension   4. Hypercholesterolemia   5. Left ventricular hypertrophy   6. Premature ventricular contractions   7. Educated about COVID-19 virus infection    PLAN:    In order of problems listed above:  1. Secondary prevention reviewed pertaining to both coronary disease and carotid disease.  The implications of left subclavian stenosis were discussed as far as subclavian steal/coronary still may be involved.  This will be monitored and followed over time. 2. Will refer to [facility]VS for comanagement of carotid disease.  Continue antiplatelet therapy, aggressive lipid-lowering, target blood pressure 130/80 mmHg.  Engage in physical activity greater than 150 minutes/week. 3. Target blood pressure less than 130/80 mmHg.  I rechecked his blood pressures to look for asymmetry and arms due to left subclavian stenosis and was surprised to find my readings to be 165/90 mmHg.  He will monitor blood pressures at home and report back to [place].  Less than 3 g sodium diet.  Decrease alcohol intake. 4. Continue low-dose rosuvastatin and Repatha.  On November 19 LDL was 11.  Target is 55.  May eventually further reduce rosuvastatin but continue current doses and we will follow along. 5. Did not discuss. 6. Asymptomatic. 7. Vaccinated and boosted.  Overall education and awareness concerning secondary risk prevention was discussed in detail: LDL less than 70, hemoglobin A1c less than 7, blood pressure target less than 130/80 mmHg, >150 minutes of moderate aerobic activity per week, avoidance of smoking, weight control (via diet and exercise), and continued surveillance/management of/for obstructive sleep  apnea.    Medication Adjustments/Labs and Tests Ordered: Current medicines are reviewed at length with the patient today.  Concerns  regarding medicines are outlined above.  Orders Placed This Encounter  Procedures  . Ambulatory referral to Vascular Surgery   No orders of the defined types were placed in this encounter.   Patient Instructions  Medication Instructions:  Your physician recommends that you continue on your current medications as directed. Please refer to the Current Medication list given to you today.  *If you need a refill on your cardiac medications before your next appointment, please call your pharmacy*   Lab Work: None If you have labs (blood work) drawn today and your tests are completely normal, you will receive your results only by: Marland Kitchen MyChart Message (if you have MyChart) OR . A paper copy in the mail If you have any lab test that is abnormal or we need to change your treatment, we will call you to review the results.   Testing/Procedures: None   Follow-Up: At [facility], you and your health needs are our priority.  As part of our continuing mission to provide you with exceptional heart care, we have created designated Provider Care Teams.  These Care Teams include your primary Cardiologist (physician) and Advanced Practice Providers (APPs -  Physician Assistants and Nurse Practitioners) who all work together to provide you with the care you need, when you need it.  We recommend signing up for the patient portal called "MyChart".  Sign up information is provided on this After Visit Summary.  MyChart is used to connect with patients for Virtual Visits (Telemedicine).  Patients are able to view lab/test results, encounter notes, upcoming appointments, etc.  Non-urgent messages can be sent to your provider as well.   To learn more about what you can do with MyChart, go to [email or web address].    Your next appointment:   6 month(s)  The format for  your next appointment:   In Person  Provider:   You may see Sinclair Grooms, MD or one of the following Advanced Practice Providers on your designated Care Team:    Truitt Merle, NP  Cecilie Kicks, NP  Kathyrn Drown, NP  You have been referred to Dr. Donzetta Matters or Dr. Trula Slade with our Vein and Vascular Specialist.   Other Instructions  Your physician recommends that you monitor your blood pressure regularly and contact us with those readings in two weeks.   Low-Sodium Eating Plan Sodium, which is an element that makes up salt, helps you maintain a healthy balance of fluids in your body. Too much sodium can increase your blood pressure and cause fluid and waste to be held in your body. Your health care provider or dietitian may recommend following this plan if you have high blood pressure (hypertension), kidney disease, liver disease, or heart failure. Eating less sodium can help lower your blood pressure, reduce swelling, and protect your heart, liver, and kidneys. What are tips for following this plan? General guidelines  Most people on this plan should limit their sodium intake to 1,500-2,000 mg (milligrams) of sodium each day. Reading food labels   The Nutrition Facts label lists the amount of sodium in one serving of the food. If you eat more than one serving, you must multiply the listed amount of sodium by the number of servings.  Choose foods with less than 140 mg of sodium per serving.  Avoid foods with 300 mg of sodium or more per serving. Shopping  Look for lower-sodium products, often labeled as "low-sodium" or "no salt added."  Always check the sodium content even if foods are  labeled as "unsalted" or "no salt added".  Buy fresh foods. ? Avoid canned foods and premade or frozen meals. ? Avoid canned, cured, or processed meats  Buy breads that have less than 80 mg of sodium per slice. Cooking  Eat more home-cooked food and less restaurant, buffet, and fast  food.  Avoid adding salt when cooking. Use salt-free seasonings or herbs instead of table salt or sea salt. Check with your health care provider or pharmacist before using salt substitutes.  Cook with plant-based oils, such as canola, sunflower, or olive oil. Meal planning  When eating at a restaurant, ask that your food be prepared with less salt or no salt, if possible.  Avoid foods that contain MSG (monosodium glutamate). MSG is sometimes added to [place] food, bouillon, and some canned foods. What foods are recommended? The items listed may not be a complete list. Talk with your dietitian about what dietary choices are best for you. Grains Low-sodium cereals, including oats, puffed wheat and rice, and shredded wheat. Low-sodium crackers. Unsalted rice. Unsalted pasta. Low-sodium bread. Whole-grain breads and whole-grain pasta. Vegetables Fresh or frozen vegetables. "No salt added" canned vegetables. "No salt added" tomato sauce and paste. Low-sodium or reduced-sodium tomato and vegetable juice. Fruits Fresh, frozen, or canned fruit. Fruit juice. Meats and other protein foods Fresh or frozen (no salt added) meat, poultry, seafood, and fish. Low-sodium canned tuna and salmon. Unsalted nuts. Dried peas, beans, and lentils without added salt. Unsalted canned beans. Eggs. Unsalted nut butters. Dairy Milk. Soy milk. Cheese that is naturally low in sodium, such as ricotta cheese, fresh mozzarella, or Swiss cheese Low-sodium or reduced-sodium cheese. Cream cheese. Yogurt. Fats and oils Unsalted butter. Unsalted margarine with no trans fat. Vegetable oils such as canola or olive oils. Seasonings and other foods Fresh and dried herbs and spices. Salt-free seasonings. Low-sodium mustard and ketchup. Sodium-free salad dressing. Sodium-free light mayonnaise. Fresh or refrigerated horseradish. Lemon juice. Vinegar. Homemade, reduced-sodium, or low-sodium soups. Unsalted popcorn and pretzels. Low-salt  or salt-free chips. What foods are not recommended? The items listed may not be a complete list. Talk with your dietitian about what dietary choices are best for you. Grains Instant hot cereals. Bread stuffing, pancake, and biscuit mixes. Croutons. Seasoned rice or pasta mixes. Noodle soup cups. Boxed or frozen macaroni and cheese. Regular salted crackers. Self-rising flour. Vegetables Sauerkraut, pickled vegetables, and relishes. Olives. [place] fries. Onion rings. Regular canned vegetables (not low-sodium or reduced-sodium). Regular canned tomato sauce and paste (not low-sodium or reduced-sodium). Regular tomato and vegetable juice (not low-sodium or reduced-sodium). Frozen vegetables in sauces. Meats and other protein foods Meat or fish that is salted, canned, smoked, spiced, or pickled. Bacon, ham, sausage, hotdogs, corned beef, chipped beef, packaged lunch meats, salt pork, jerky, pickled herring, anchovies, regular canned tuna, sardines, salted nuts. Dairy Processed cheese and cheese spreads. Cheese curds. Blue cheese. Feta cheese. String cheese. Regular cottage cheese. Buttermilk. Canned milk. Fats and oils Salted butter. Regular margarine. Ghee. Bacon fat. Seasonings and other foods Onion salt, garlic salt, seasoned salt, table salt, and sea salt. Canned and packaged gravies. Worcestershire sauce. Tartar sauce. Barbecue sauce. Teriyaki sauce. Soy sauce, including reduced-sodium. Steak sauce. Fish sauce. Oyster sauce. Cocktail sauce. Horseradish that you find on the shelf. Regular ketchup and mustard. Meat flavorings and tenderizers. Bouillon cubes. Hot sauce and Tabasco sauce. Premade or packaged marinades. Premade or packaged taco seasonings. Relishes. Regular salad dressings. Salsa. Potato and tortilla chips. Corn chips and puffs. Salted popcorn and pretzels.  Canned or dried soups. Pizza. Frozen entrees and pot pies. Summary  Eating less sodium can help lower your blood pressure, reduce  swelling, and protect your heart, liver, and kidneys.  Most people on this plan should limit their sodium intake to 1,500-2,000 mg (milligrams) of sodium each day.  Canned, boxed, and frozen foods are high in sodium. Restaurant foods, fast foods, and pizza are also very high in sodium. You also get sodium by adding salt to food.  Try to cook at home, eat more fresh fruits and vegetables, and eat less fast food, canned, processed, or prepared foods. This information is not intended to replace advice given to you by your health care provider. Make sure you discuss any questions you have with your health care provider. Document Revised: 06/29/2017 Document Reviewed: 07/10/2016 Elsevier Patient Education  2020 Britt, Sinclair Grooms, MD  07/01/2020 9:34 AM    [place]

## 2020-06-29 DIAGNOSIS — H43391 Other vitreous opacities, right eye: Secondary | ICD-10-CM | POA: Diagnosis not present

## 2020-06-29 DIAGNOSIS — H11441 Conjunctival cysts, right eye: Secondary | ICD-10-CM | POA: Diagnosis not present

## 2020-06-29 DIAGNOSIS — H2513 Age-related nuclear cataract, bilateral: Secondary | ICD-10-CM | POA: Diagnosis not present

## 2020-06-29 DIAGNOSIS — H31092 Other chorioretinal scars, left eye: Secondary | ICD-10-CM | POA: Diagnosis not present

## 2020-07-01 ENCOUNTER — Ambulatory Visit: Payer: Medicare Other | Admitting: Interventional Cardiology

## 2020-07-01 ENCOUNTER — Encounter: Payer: Self-pay | Admitting: Interventional Cardiology

## 2020-07-01 ENCOUNTER — Other Ambulatory Visit: Payer: Self-pay

## 2020-07-01 VITALS — BP 130/70 | HR 56 | Ht 74.0 in | Wt 220.8 lb

## 2020-07-01 DIAGNOSIS — I6521 Occlusion and stenosis of right carotid artery: Secondary | ICD-10-CM

## 2020-07-01 DIAGNOSIS — E7800 Pure hypercholesterolemia, unspecified: Secondary | ICD-10-CM

## 2020-07-01 DIAGNOSIS — I10 Essential (primary) hypertension: Secondary | ICD-10-CM

## 2020-07-01 DIAGNOSIS — I517 Cardiomegaly: Secondary | ICD-10-CM

## 2020-07-01 DIAGNOSIS — I25119 Atherosclerotic heart disease of native coronary artery with unspecified angina pectoris: Secondary | ICD-10-CM | POA: Diagnosis not present

## 2020-07-01 DIAGNOSIS — I493 Ventricular premature depolarization: Secondary | ICD-10-CM

## 2020-07-01 DIAGNOSIS — Z7189 Other specified counseling: Secondary | ICD-10-CM

## 2020-07-01 NOTE — Patient Instructions (Addendum)
Medication Instructions:  Your physician recommends that you continue on your current medications as directed. Please refer to the Current Medication list given to you today.  *If you need a refill on your cardiac medications before your next appointment, please call your pharmacy*   Lab Work: None If you have labs (blood work) drawn today and your tests are completely normal, you will receive your results only by: Marland Kitchen MyChart Message (if you have MyChart) OR . A paper copy in the mail If you have any lab test that is abnormal or we need to change your treatment, we will call you to review the results.   Testing/Procedures: None   Follow-Up: At [facility], you and your health needs are our priority.  As part of our continuing mission to provide you with exceptional heart care, we have created designated Provider Care Teams.  These Care Teams include your primary Cardiologist (physician) and Advanced Practice Providers (APPs -  Physician Assistants and Nurse Practitioners) who all work together to provide you with the care you need, when you need it.  We recommend signing up for the patient portal called "MyChart".  Sign up information is provided on this After Visit Summary.  MyChart is used to connect with patients for Virtual Visits (Telemedicine).  Patients are able to view lab/test results, encounter notes, upcoming appointments, etc.  Non-urgent messages can be sent to your provider as well.   To learn more about what you can do with MyChart, go to [email or web address].    Your next appointment:   6 month(s)  The format for your next appointment:   In Person  Provider:   You may see Sinclair Grooms, MD or one of the following Advanced Practice Providers on your designated Care Team:    Truitt Merle, NP  Cecilie Kicks, NP  Kathyrn Drown, NP  You have been referred to Dr. Donzetta Matters or Dr. Trula Slade with our [facility].   Other Instructions  Your  physician recommends that you monitor your blood pressure regularly and contact us with those readings in two weeks.   Low-Sodium Eating Plan Sodium, which is an element that makes up salt, helps you maintain a healthy balance of fluids in your body. Too much sodium can increase your blood pressure and cause fluid and waste to be held in your body. Your health care provider or dietitian may recommend following this plan if you have high blood pressure (hypertension), kidney disease, liver disease, or heart failure. Eating less sodium can help lower your blood pressure, reduce swelling, and protect your heart, liver, and kidneys. What are tips for following this plan? General guidelines  Most people on this plan should limit their sodium intake to 1,500-2,000 mg (milligrams) of sodium each day. Reading food labels   The Nutrition Facts label lists the amount of sodium in one serving of the food. If you eat more than one serving, you must multiply the listed amount of sodium by the number of servings.  Choose foods with less than 140 mg of sodium per serving.  Avoid foods with 300 mg of sodium or more per serving. Shopping  Look for lower-sodium products, often labeled as "low-sodium" or "no salt added."  Always check the sodium content even if foods are labeled as "unsalted" or "no salt added".  Buy fresh foods. ? Avoid canned foods and premade or frozen meals. ? Avoid canned, cured, or processed meats  Buy breads that have less than 80 mg of  sodium per slice. Cooking  Eat more home-cooked food and less restaurant, buffet, and fast food.  Avoid adding salt when cooking. Use salt-free seasonings or herbs instead of table salt or sea salt. Check with your health care provider or pharmacist before using salt substitutes.  Cook with plant-based oils, such as canola, sunflower, or olive oil. Meal planning  When eating at a restaurant, ask that your food be prepared with less salt or  no salt, if possible.  Avoid foods that contain MSG (monosodium glutamate). MSG is sometimes added to [place] food, bouillon, and some canned foods. What foods are recommended? The items listed may not be a complete list. Talk with your dietitian about what dietary choices are best for you. Grains Low-sodium cereals, including oats, puffed wheat and rice, and shredded wheat. Low-sodium crackers. Unsalted rice. Unsalted pasta. Low-sodium bread. Whole-grain breads and whole-grain pasta. Vegetables Fresh or frozen vegetables. "No salt added" canned vegetables. "No salt added" tomato sauce and paste. Low-sodium or reduced-sodium tomato and vegetable juice. Fruits Fresh, frozen, or canned fruit. Fruit juice. Meats and other protein foods Fresh or frozen (no salt added) meat, poultry, seafood, and fish. Low-sodium canned tuna and salmon. Unsalted nuts. Dried peas, beans, and lentils without added salt. Unsalted canned beans. Eggs. Unsalted nut butters. Dairy Milk. Soy milk. Cheese that is naturally low in sodium, such as ricotta cheese, fresh mozzarella, or Swiss cheese Low-sodium or reduced-sodium cheese. Cream cheese. Yogurt. Fats and oils Unsalted butter. Unsalted margarine with no trans fat. Vegetable oils such as canola or olive oils. Seasonings and other foods Fresh and dried herbs and spices. Salt-free seasonings. Low-sodium mustard and ketchup. Sodium-free salad dressing. Sodium-free light mayonnaise. Fresh or refrigerated horseradish. Lemon juice. Vinegar. Homemade, reduced-sodium, or low-sodium soups. Unsalted popcorn and pretzels. Low-salt or salt-free chips. What foods are not recommended? The items listed may not be a complete list. Talk with your dietitian about what dietary choices are best for you. Grains Instant hot cereals. Bread stuffing, pancake, and biscuit mixes. Croutons. Seasoned rice or pasta mixes. Noodle soup cups. Boxed or frozen macaroni and cheese. Regular salted  crackers. Self-rising flour. Vegetables Sauerkraut, pickled vegetables, and relishes. Olives. [place] fries. Onion rings. Regular canned vegetables (not low-sodium or reduced-sodium). Regular canned tomato sauce and paste (not low-sodium or reduced-sodium). Regular tomato and vegetable juice (not low-sodium or reduced-sodium). Frozen vegetables in sauces. Meats and other protein foods Meat or fish that is salted, canned, smoked, spiced, or pickled. Bacon, ham, sausage, hotdogs, corned beef, chipped beef, packaged lunch meats, salt pork, jerky, pickled herring, anchovies, regular canned tuna, sardines, salted nuts. Dairy Processed cheese and cheese spreads. Cheese curds. Blue cheese. Feta cheese. String cheese. Regular cottage cheese. Buttermilk. Canned milk. Fats and oils Salted butter. Regular margarine. Ghee. Bacon fat. Seasonings and other foods Onion salt, garlic salt, seasoned salt, table salt, and sea salt. Canned and packaged gravies. Worcestershire sauce. Tartar sauce. Barbecue sauce. Teriyaki sauce. Soy sauce, including reduced-sodium. Steak sauce. Fish sauce. Oyster sauce. Cocktail sauce. Horseradish that you find on the shelf. Regular ketchup and mustard. Meat flavorings and tenderizers. Bouillon cubes. Hot sauce and Tabasco sauce. Premade or packaged marinades. Premade or packaged taco seasonings. Relishes. Regular salad dressings. Salsa. Potato and tortilla chips. Corn chips and puffs. Salted popcorn and pretzels. Canned or dried soups. Pizza. Frozen entrees and pot pies. Summary  Eating less sodium can help lower your blood pressure, reduce swelling, and protect your heart, liver, and kidneys.  Most people on this plan should  limit their sodium intake to 1,500-2,000 mg (milligrams) of sodium each day.  Canned, boxed, and frozen foods are high in sodium. Restaurant foods, fast foods, and pizza are also very high in sodium. You also get sodium by adding salt to food.  Try to cook at  home, eat more fresh fruits and vegetables, and eat less fast food, canned, processed, or prepared foods. This information is not intended to replace advice given to you by your health care provider. Make sure you discuss any questions you have with your health care provider. Document Revised: 06/29/2017 Document Reviewed: 07/10/2016 Elsevier Patient Education  2020 [employer].

## 2020-07-21 NOTE — Telephone Encounter (Signed)
The BP is not high enough to cause HA. Cannot further increase the Metoprolol due to HR in 50's. Start Losartan 25 mg daily and monitor.

## 2020-07-22 MED ORDER — LOSARTAN POTASSIUM 25 MG PO TABS: 25.0000 mg | ORAL_TABLET | Freq: Every day | ORAL | 3 refills | Status: DC

## 2020-07-22 MED ORDER — ROSUVASTATIN CALCIUM 10 MG PO TABS: 10.0000 mg | ORAL_TABLET | Freq: Every day | ORAL | 3 refills | Status: DC

## 2020-07-22 NOTE — Telephone Encounter (Signed)
Patient states he is returning call. 

## 2020-07-22 NOTE — Addendum Note (Signed)
Addended by: Dollene Primrose on: 07/22/2020 02:11 PM   Modules accepted: Orders

## 2020-08-20 ENCOUNTER — Other Ambulatory Visit: Payer: Self-pay

## 2020-08-20 ENCOUNTER — Encounter: Payer: Medicare Other | Admitting: Vascular Surgery

## 2020-08-20 ENCOUNTER — Ambulatory Visit: Payer: Medicare Other | Admitting: Vascular Surgery

## 2020-08-20 ENCOUNTER — Encounter: Payer: Self-pay | Admitting: Vascular Surgery

## 2020-08-20 VITALS — BP 119/68 | HR 55 | Temp 97.9°F | Resp 20 | Ht 74.0 in | Wt 223.0 lb

## 2020-08-20 DIAGNOSIS — I6523 Occlusion and stenosis of bilateral carotid arteries: Secondary | ICD-10-CM | POA: Diagnosis not present

## 2020-08-20 NOTE — Progress Notes (Signed)
Patient ID: Shane Shelton, male   DOB: 07/09/1946, 75 y.o.   MRN: [id number]  Reason for Consult: New Patient (Initial Visit)   Referred by [name], MD  Subjective:     HPI:  Shane Shelton is a 75 y.o. male with smoking history did quit over 10 years ago also has coronary artery disease as well as hyperlipidemia and hypertension.  He denies any history of stroke, TIA or amaurosis.  He does not have any known personal or family history of aneurysm disease.  He is new to town.  He does take aspirin, Plavix he is on 10 mg of Crestor as well as Repatha.  He has never had vascular intervention in the past.  He does not have claudication.   Past Medical History:  Diagnosis Date   Cancer [facility])    Throat cancer 2011   Carotid artery occlusion    Coronary artery disease    Hyperlipidemia    Hypertension    Family History  Problem Relation Age of Onset   Heart disease Mother    Diabetes Mother    Hyperlipidemia Mother    Cancer Father    Heart disease Brother    Cancer Brother    Hyperlipidemia Brother    Past Surgical History:  Procedure Laterality Date   CARDIAC CATHETERIZATION  05/08/2017   S/P S/P PCI, DES of Mid RCA at [facility] in [place]   COLONOSCOPY     multiple   ESOPHAGOGASTRODUODENOSCOPY     multiple   ESOPHAGOSCOPY W/ PERCUTANEOUS GASTROSTOMY TUBE PLACEMENT  2011   INTRAVASCULAR PRESSURE WIRE/FFR STUDY N/A 10/09/2019   Procedure: INTRAVASCULAR PRESSURE WIRE/FFR STUDY;  Surgeon: Belva Crome, MD;  Location: [place] [facility];  Service: Cardiovascular;  Laterality: N/A;   LEFT HEART CATH AND CORONARY ANGIOGRAPHY N/A 10/09/2019   Procedure: LEFT HEART CATH AND CORONARY ANGIOGRAPHY;  Surgeon: Belva Crome, MD;  Location: [place] [facility];  Service: Cardiovascular;  Laterality: N/A;    Short Social History:  Social History   Tobacco Use   Smoking status: Former Smoker    Packs/day: 1.00     Years: 40.00    Pack years: 40.00    Types: Cigarettes    Quit date: 10/05/2009    Years since quitting: 10.8   Smokeless tobacco: Never Used  Substance Use Topics   Alcohol use: Yes    Alcohol/week: 7.0 standard drinks    Types: 7 Glasses of wine per week    Allergies  Allergen Reactions   Ambien [Zolpidem] Other (See Comments)    Sleepwalking   Gabapentin Other (See Comments)    Tingling/lightheaded   Statins Other (See Comments)    Tingling and flatulence with atorvastatin and rosuvastatin.     Current Outpatient Medications  Medication Sig Dispense Refill   acetaminophen (TYLENOL) 500 MG tablet Take 1,000 mg by mouth every 6 (six) hours as needed (for pain.).     aspirin EC 81 MG tablet Take 81 mg by mouth every evening.      clopidogrel (PLAVIX) 75 MG tablet Take 1 tablet (75 mg total) by mouth daily. 90 tablet 3   Coenzyme Q-10 200 MG CAPS Take 200 mg by mouth every evening.      Evolocumab (REPATHA SURECLICK) 671 MG/ML SOAJ Inject 140 mg into the skin every 14 (fourteen) days. 6 mL 3   isosorbide mononitrate (IMDUR) 60 MG 24 hr tablet Take 1 tablet (60 mg total) by mouth every  evening. 90 tablet 3   losartan (COZAAR) 25 MG tablet Take 1 tablet (25 mg total) by mouth daily. 90 tablet 3   metoprolol succinate (TOPROL-XL) 25 MG 24 hr tablet Take 1 tablet (25 mg total) by mouth daily. 90 tablet 3   naproxen sodium (ALEVE) 220 MG tablet Take 220 mg by mouth 2 (two) times daily as needed (pain.).     nitroGLYCERIN (NITROSTAT) 0.4 MG SL tablet Place 1 tablet (0.4 mg total) under the tongue every 5 (five) minutes as needed for chest pain. 25 tablet 3   pantoprazole (PROTONIX) 40 MG tablet Take 40 mg by mouth daily.     rosuvastatin (CRESTOR) 10 MG tablet Take 1 tablet (10 mg total) by mouth daily. 90 tablet 3   No current facility-administered medications for this visit.    Review of Systems  Constitutional:  Constitutional negative. HENT: HENT negative.   Eyes: Eyes negative.  Respiratory: Respiratory negative.  Cardiovascular: Cardiovascular negative.  GI: Gastrointestinal negative.  Musculoskeletal: Musculoskeletal negative.  Skin: Skin negative.  Neurological: Neurological negative. Hematologic: Hematologic/lymphatic negative.  Psychiatric: Psychiatric negative.        Objective:  Objective   Vitals:   08/20/20 1521 08/20/20 1523  BP: 131/81 119/68  Pulse: (!) 55   Resp: 20   Temp: 97.9 F (36.6 C)   SpO2: 96%   Weight: 223 lb (101.2 kg)   Height: 6\' 2"  (1.88 m)    Body mass index is 28.63 kg/m.  Physical Exam HENT:     Head: Normocephalic.     Nose:     Comments: Wearing a mask Eyes:     Pupils: Pupils are equal, round, and reactive to light.  Neck:     Vascular: No carotid bruit.  Cardiovascular:     Rate and Rhythm: Normal rate and regular rhythm.     Heart sounds: Normal heart sounds.  Pulmonary:     Effort: Pulmonary effort is normal.     Breath sounds: Normal breath sounds.  Abdominal:     General: Abdomen is flat.     Palpations: Abdomen is soft. There is mass.  Musculoskeletal:        General: No swelling. Normal range of motion.  Skin:    General: Skin is warm and dry.     Capillary Refill: Capillary refill takes less than 2 seconds.  Neurological:     General: No focal deficit present.     Mental Status: He is alert.  Psychiatric:        Mood and Affect: Mood normal.        Behavior: Behavior normal.        Thought Content: Thought content normal.        Judgment: Judgment normal.     Data: Right Carotid Findings:  +----------+--------+--------+--------+------------------+-----------------  ----+         PSV cm/s EDV cm/s Stenosis Plaque Description Comments          +----------+--------+--------+--------+------------------+-----------------  ----+   CCA Prox  81    18                                  +----------+--------+--------+--------+------------------+-----------------  ----+   CCA Distal 61    14                                 +----------+--------+--------+--------+------------------+-----------------  ----+  ICA Prox  73    24         smooth                     +----------+--------+--------+--------+------------------+-----------------  ----+   ICA Mid   254    80    60-79%  diffuse and smooth large plaque  burden,                                    visibily  narrowed                                     flow channel        +----------+--------+--------+--------+------------------+-----------------  ----+   ICA Distal 71    26                                 +----------+--------+--------+--------+------------------+-----------------  ----+   ECA     66    11                                 +----------+--------+--------+--------+------------------+-----------------  ----+   +----------+--------+-------+----------------+-------------------+         PSV cm/s EDV cms Describe     Arm Pressure (mmHG)   +----------+--------+-------+----------------+-------------------+   Subclavian 207         Multiphasic, WNL              +----------+--------+-------+----------------+-------------------+   +---------+--------+--+--------+-+---------+   Vertebral PSV cm/s 39 EDV cm/s 8 Antegrade   +---------+--------+--+--------+-+---------+      Left Carotid Findings:  +----------+--------+--------+--------+------------------+-----------------  -+         PSV cm/s EDV cm/s Stenosis Plaque Description Comments         +----------+--------+--------+--------+------------------+-----------------  -+   CCA Prox  80    14                                 +----------+--------+--------+--------+------------------+-----------------  -+   CCA Distal 64    14    <50%   smooth       intimal  thickening   +----------+--------+--------+--------+------------------+-----------------  -+   ICA Prox  57    17    1-39%   hypoechoic                  +----------+--------+--------+--------+------------------+-----------------  -+   ICA Mid   41    16                   poor Doppler  angle   +----------+--------+--------+--------+------------------+-----------------  -+   ICA Distal 75    26                                +----------+--------+--------+--------+------------------+-----------------  -+   ECA     56    8                                 +----------+--------+--------+--------+------------------+-----------------  -+   +----------+--------+--------+--------+-------------------+  PSV cm/s EDV cm/s Describe Arm Pressure (mmHG)   +----------+--------+--------+--------+-------------------+   Subclavian 303         Stenotic              +----------+--------+--------+--------+-------------------+   +---------+--------+--+--------+--+---------+   Vertebral PSV cm/s 43 EDV cm/s 16 Antegrade   +---------+--------+--+--------+--+---------+         Assessment/Plan:     75 year old male here with asymptomatic right ICA stenosis.  I have reviewed the duplex findings with him as well as his low risk of stroke particularly with maximal medical therapy which he has been compliant with with dual antiplatelets as well as low-dose Crestor and Repatha.  I have recommended yearly screening of his carotid arteries.  We will see him back in 1 year with repeat duplex unless he has issues prior.  All of his questions were answered.     Waynetta Sandy MD [facility]

## 2020-09-11 ENCOUNTER — Other Ambulatory Visit: Payer: Self-pay | Admitting: Interventional Cardiology

## 2020-09-13 ENCOUNTER — Other Ambulatory Visit: Payer: Self-pay

## 2020-09-13 MED ORDER — NITROGLYCERIN 0.4 MG SL SUBL: 0.4000 mg | SUBLINGUAL_TABLET | SUBLINGUAL | 2 refills | Status: DC | PRN

## 2020-09-13 MED ORDER — METOPROLOL SUCCINATE ER 25 MG PO TB24: 25.0000 mg | ORAL_TABLET | Freq: Every day | ORAL | 3 refills | Status: DC

## 2020-10-23 IMAGING — CT CT CHEST W/O CM
2 of 4 series · 13 of 36 positions shown, 16 images · non-contrast
Comparison: None available forearm

CLINICAL DATA: History throat cancer.  History of smoking.

EXAM:
CT CHEST WITHOUT CONTRAST
TECHNIQUE: Multidetector CT imaging of the chest was performed following the
standard protocol without IV contrast.

[Series 3: chest 2.00 br40 s3 · axial · 0.73mm/px · z∈[+1567,+1841]mm · 10 of 163 slices shown, 13 images (1 of 2)]
[im 13/163  mediastinal]
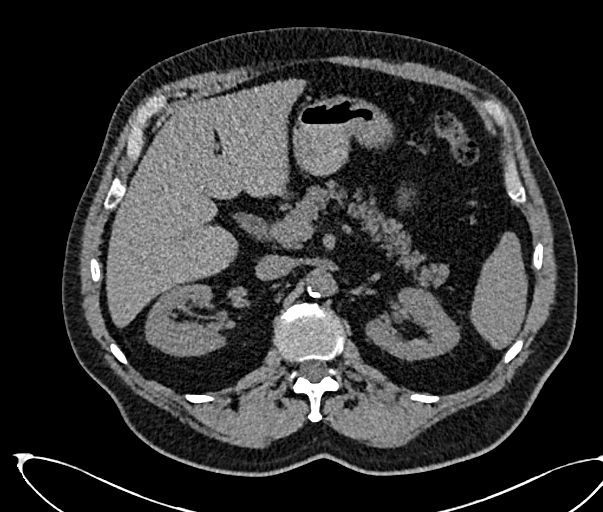
[im 13/163  lung]
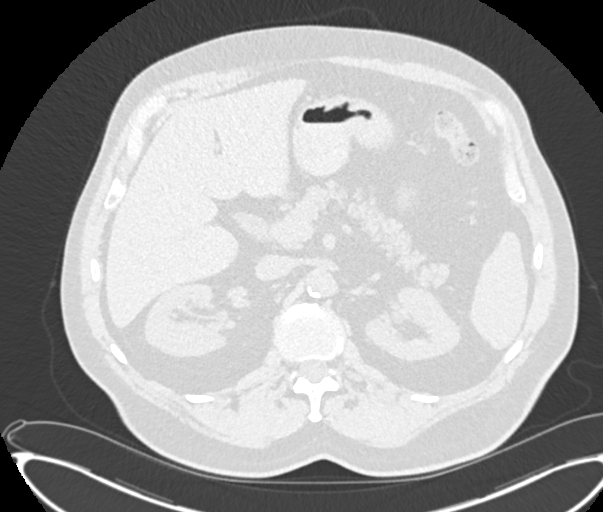
[im 25/163  lung]
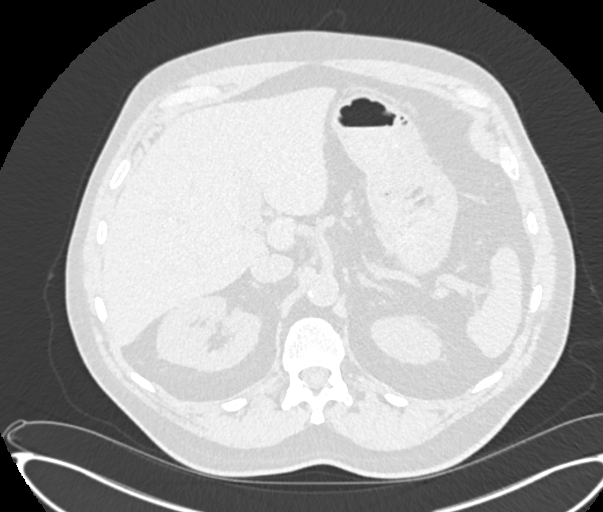
[im 50/163  lung]
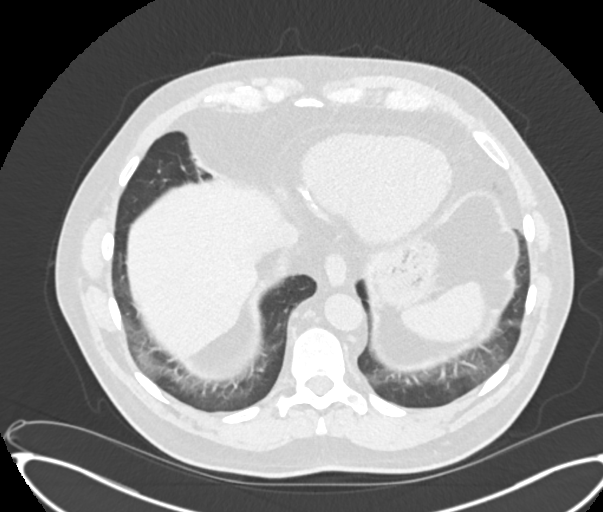
[im 63/163  lung]
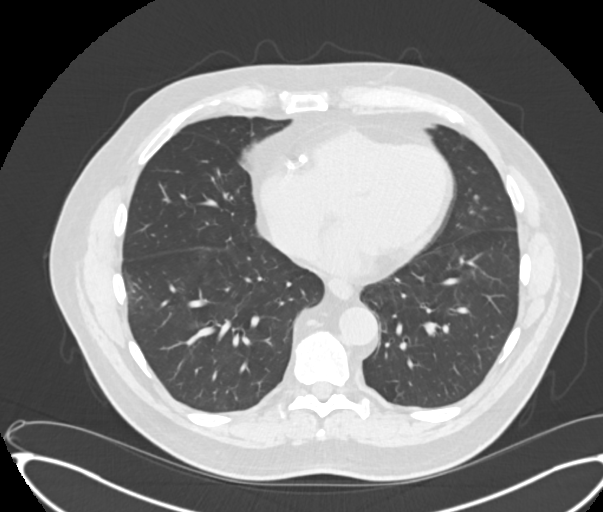
[im 75/163  mediastinal]
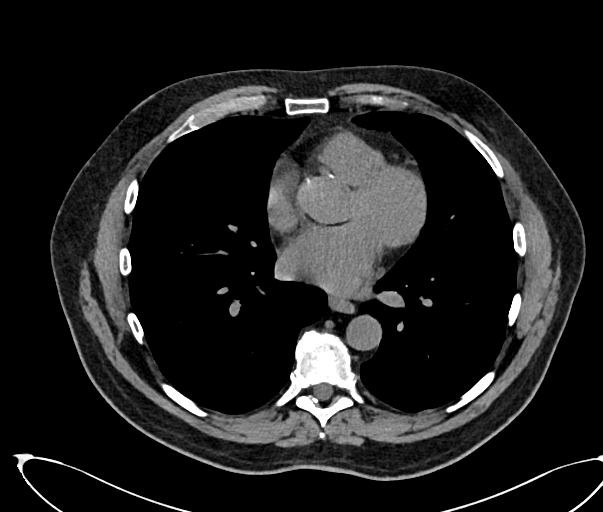
[im 75/163  lung]
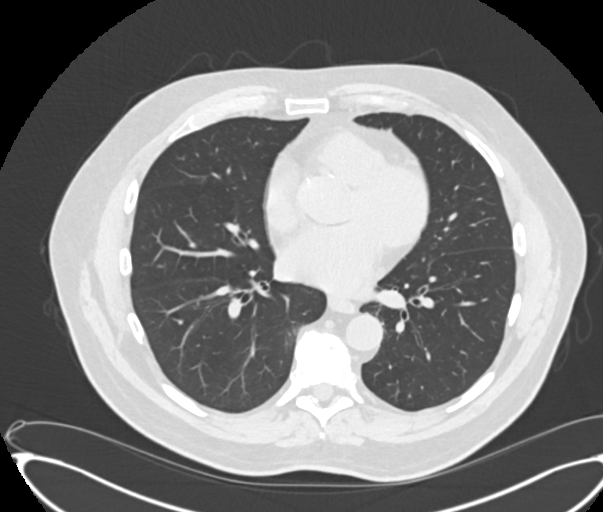
[im 88/163  lung]
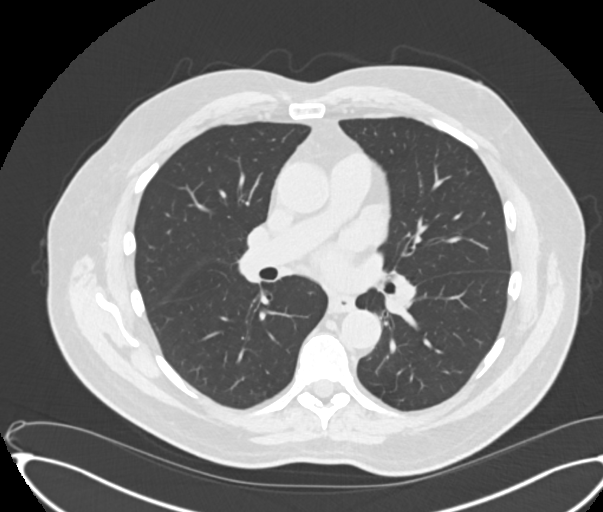
[im 100/163  lung]
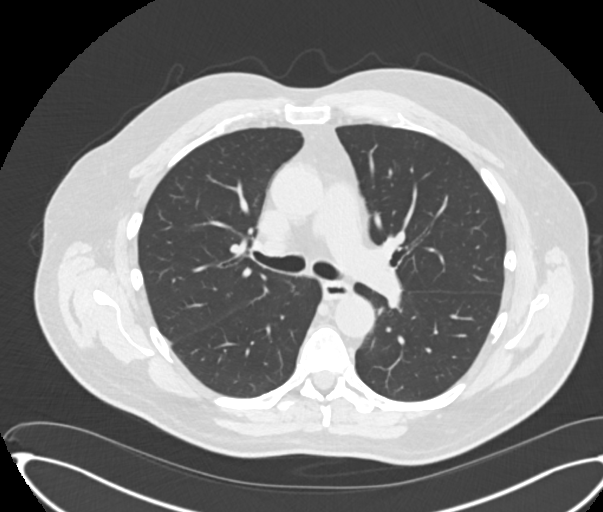
[im 125/163  lung]
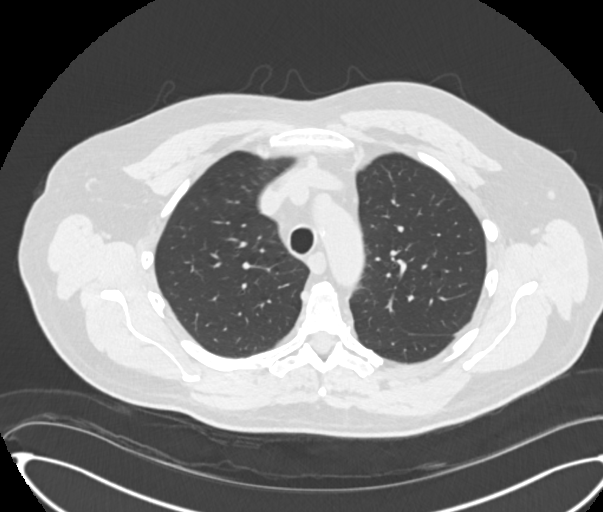
[im 138/163  mediastinal]
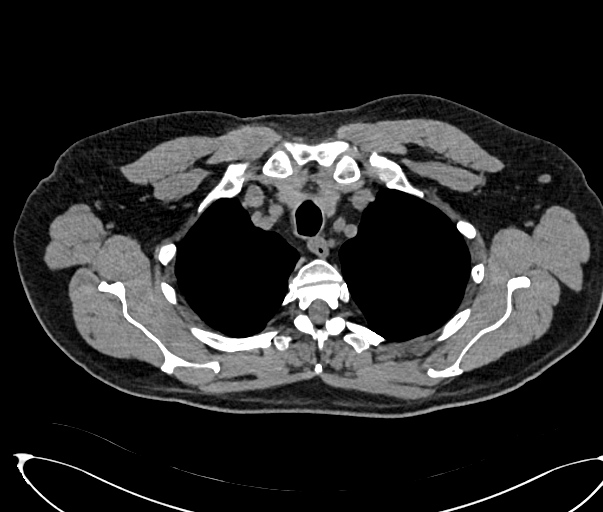
[im 138/163  lung]
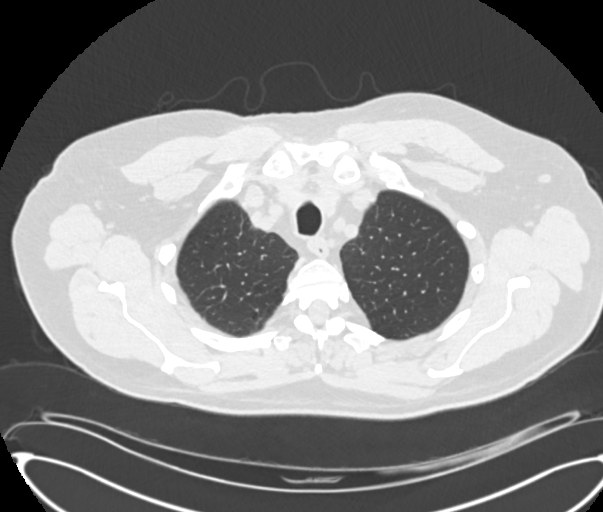
[im 150/163  lung]
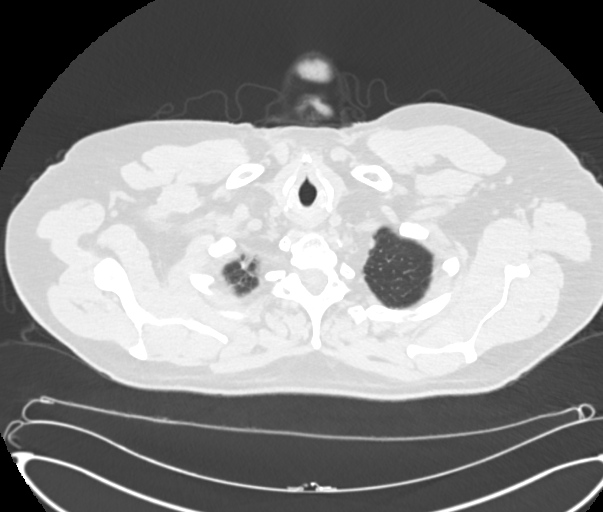

[Series 5: chest 2.00 br40 s3 · coronal · 0.64mm/px · 3 of 187 slices shown (2 of 2)]
[im 38/187  lung]
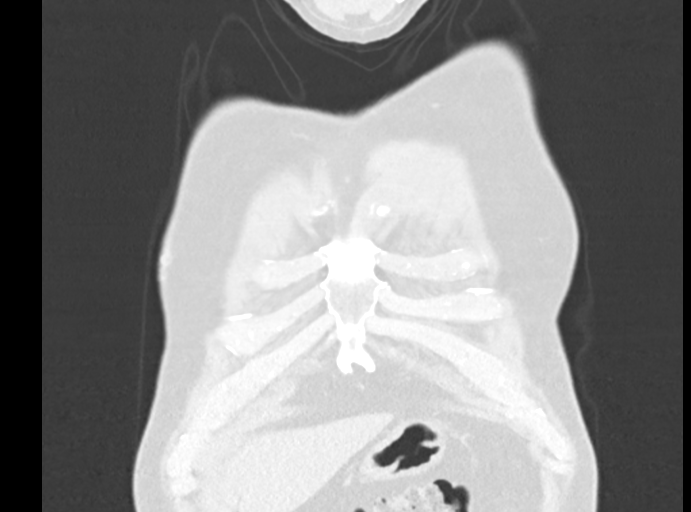
[im 75/187  lung]
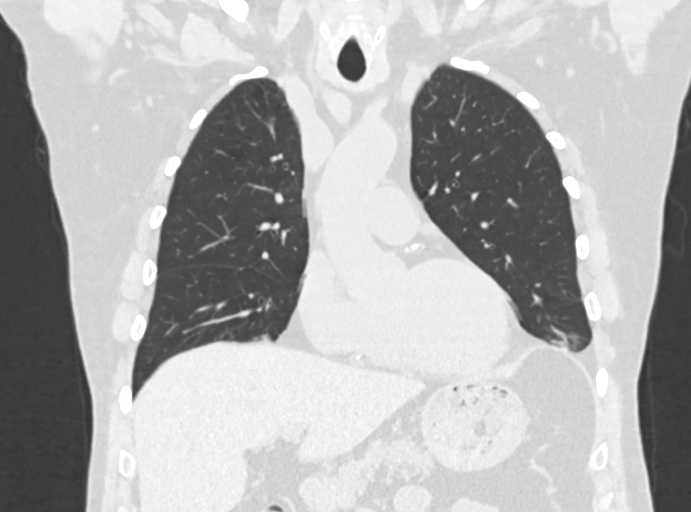
[im 112/187  lung]
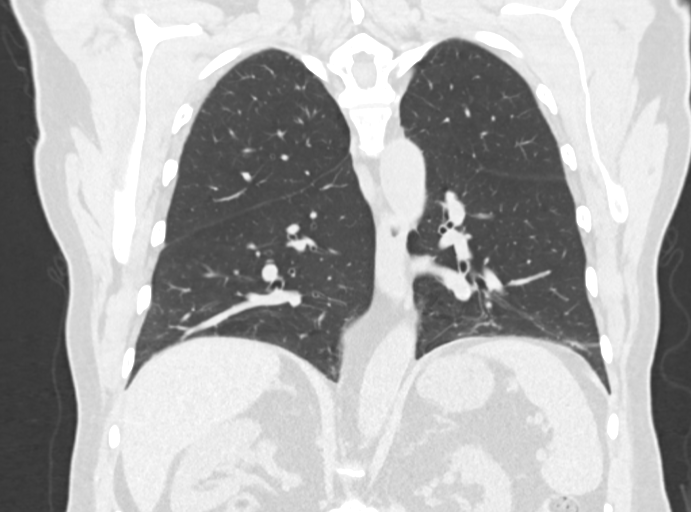

[13 of 36 positions shown; findings below may reference images not displayed]

FINDINGS: Cardiovascular: No significant vascular findings. Normal heart size.
No pericardial effusion. Scattered atherosclerotic calcifications of
the aorta and coronary arteries.

Mediastinum/Nodes: No enlarged mediastinal or axillary lymph nodes.
Thyroid gland, trachea, and esophagus demonstrate no significant
findings.

Lungs/Pleura: Right greater than left biapical pleuroparenchymal
scarring. Lungs are otherwise clear. Minimal centrilobular emphysema
with upper lobe predominance. No focal airspace consolidation. No
suspicious pulmonary nodule. No pleural effusion. No pneumothorax.

Upper Abdomen: No acute abnormality.

Musculoskeletal: No chest wall mass or suspicious bone lesions
identified.
IMPRESSION: 1. No acute findings. No evidence of metastatic disease within the
chest.
2. Aortic Atherosclerosis (IUADG-G7C.C) and Emphysema (IUADG-ELG.A).

## 2020-11-11 ENCOUNTER — Other Ambulatory Visit: Payer: Self-pay | Admitting: Interventional Cardiology

## 2020-11-11 DIAGNOSIS — E7800 Pure hypercholesterolemia, unspecified: Secondary | ICD-10-CM

## 2020-11-25 ENCOUNTER — Other Ambulatory Visit: Payer: Self-pay | Admitting: Interventional Cardiology

## 2020-11-26 MED ORDER — ISOSORBIDE MONONITRATE ER 60 MG PO TB24: 60.0000 mg | ORAL_TABLET | Freq: Every evening | ORAL | 2 refills | Status: DC

## 2021-01-02 NOTE — Progress Notes (Deleted)
Cardiology Office Note:    Date:  01/02/2021   ID:  Shane, Shelton January 21, 1946, MRN [id number]  PCP:  [name], MD  Cardiologist:  Shane Grooms, MD   Referring MD: [name], MD   No chief complaint on file.   History of Present Illness:    Shane Shelton is a 75 y.o. male with a hx of of hyperlipidemia, hypertension and bilateral LE claudication with negative doppler 01/2017.Has a long hx of CAD with PCI2016(on way back form [place]), recurrent angina 10/2018with repeat cath in [place]. withproximal RCA and RCA PL DES and moderateresidualproximal LAD and distal RCA disease.Cath 09/2019 with moderate LAD disease best treated with medication. 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   ***  Past Medical History:  Diagnosis Date  . Cancer [facility])    Throat cancer 2011  . Carotid artery occlusion   . Coronary artery disease   . Hyperlipidemia   . Hypertension     Past Surgical History:  Procedure Laterality Date  . CARDIAC CATHETERIZATION  05/08/2017   S/P S/P PCI, DES of Mid RCA at [facility] in [place]  . COLONOSCOPY     multiple  . ESOPHAGOGASTRODUODENOSCOPY     multiple  . ESOPHAGOSCOPY W/ PERCUTANEOUS GASTROSTOMY TUBE PLACEMENT  2011  . INTRAVASCULAR PRESSURE WIRE/FFR STUDY N/A 10/09/2019   Procedure: INTRAVASCULAR PRESSURE WIRE/FFR STUDY;  Surgeon: Belva Crome, MD;  Location: [place] [facility];  Service: Cardiovascular;  Laterality: N/A;  . LEFT HEART CATH AND CORONARY ANGIOGRAPHY N/A 10/09/2019   Procedure: LEFT HEART CATH AND CORONARY ANGIOGRAPHY;  Surgeon: Belva Crome, MD;  Location: [place] [facility];  Service: Cardiovascular;  Laterality: N/A;    Current Medications: No outpatient medications have been marked as taking for the 01/03/21 encounter (Appointment) with Belva Crome, MD.     Allergies:   Ambien [zolpidem], Gabapentin, and Statins   Social History   Socioeconomic History  .  Marital status: Married    Spouse name: Verdis Frederickson  . Number of children: Not on file  . Years of education: Not on file  . Highest education level: Doctorate  Occupational History  . Occupation: Adult nurse    Comment: Retired  Tobacco Use  . Smoking status: Former Smoker    Packs/day: 1.00    Years: 40.00    Pack years: 40.00    Types: Cigarettes    Quit date: 10/05/2009    Years since quitting: 11.2  . Smokeless tobacco: Never Used  Vaping Use  . Vaping Use: Never used  Substance and Sexual Activity  . Alcohol use: Yes    Alcohol/week: 7.0 standard drinks    Types: 7 Glasses of wine per week  . Drug use: No  . Sexual activity: Yes    Comment: monagamous  Other Topics Concern  . Not on file  Social History Narrative  . Not on file   Social Determinants of Health   Financial Resource Strain: Not on file  Food Insecurity: Not on file  Transportation Needs: Not on file  Physical Activity: Not on file  Stress: Not on file  Social Connections: Not on file     Family History: The patient's family history includes Cancer in his brother and father; Diabetes in his mother; Heart disease in his brother and mother; Hyperlipidemia in his brother and mother.  ROS:   Please see the history of present illness.    *** All other systems reviewed and  are negative.  EKGs/Labs/Other Studies Reviewed:    The following studies were reviewed today: ***  EKG:  EKG ***  Recent Labs: 04/12/2020: TSH 4.440 06/08/2020: ALT 14  Recent Lipid Panel    Component Value Date/Time   CHOL 70 (L) 06/08/2020 0744   TRIG 68 06/08/2020 0744   HDL 44 06/08/2020 0744   CHOLHDL 1.6 06/08/2020 0744   LDLCALC 11 06/08/2020 0744   LDLDIRECT 100 (H) 04/04/2017 1438    Physical Exam:    VS:  There were no vitals taken for this visit.    Wt Readings from Last 3 Encounters:  08/20/20 223 lb (101.2 kg)  07/01/20 220 lb 12.8 oz (100.2 kg)  04/12/20 227 lb 9.6 oz (103.2 kg)     GEN: ***.  No acute distress HEENT: Normal NECK: No JVD. LYMPHATICS: No lymphadenopathy CARDIAC: *** murmur. RRR *** gallop, or edema. VASCULAR: *** Normal Pulses. No bruits. RESPIRATORY:  Clear to auscultation without rales, wheezing or rhonchi  ABDOMEN: Soft, non-tender, non-distended, No pulsatile mass, MUSCULOSKELETAL: No deformity  SKIN: Warm and dry NEUROLOGIC:  Alert and oriented x 3 PSYCHIATRIC:  Normal affect   ASSESSMENT:    1. Coronary artery disease involving native coronary artery of native heart with angina pectoris ([place])   2. Carotid stenosis, right   3. Essential hypertension   4. Hypercholesterolemia   5. Premature ventricular contractions    PLAN:    In order of problems listed above:  1. ***   Medication Adjustments/Labs and Tests Ordered: Current medicines are reviewed at length with the patient today.  Concerns regarding medicines are outlined above.  No orders of the defined types were placed in this encounter.  No orders of the defined types were placed in this encounter.   There are no Patient Instructions on file for this visit.   Signed, Shane Grooms, MD  01/02/2021 10:14 AM    [place]

## 2021-01-03 ENCOUNTER — Ambulatory Visit: Payer: Medicare Other | Admitting: Interventional Cardiology

## 2021-01-03 DIAGNOSIS — I6521 Occlusion and stenosis of right carotid artery: Secondary | ICD-10-CM

## 2021-01-03 DIAGNOSIS — I10 Essential (primary) hypertension: Secondary | ICD-10-CM

## 2021-01-03 DIAGNOSIS — I25119 Atherosclerotic heart disease of native coronary artery with unspecified angina pectoris: Secondary | ICD-10-CM

## 2021-01-03 DIAGNOSIS — I493 Ventricular premature depolarization: Secondary | ICD-10-CM

## 2021-01-03 DIAGNOSIS — E7800 Pure hypercholesterolemia, unspecified: Secondary | ICD-10-CM

## 2021-01-07 ENCOUNTER — Encounter: Payer: Self-pay | Admitting: Family Medicine

## 2021-01-14 ENCOUNTER — Inpatient Hospital Stay (HOSPITAL_COMMUNITY): Admission: RE | Admit: 2021-01-14 | Payer: Medicare Other | Source: Ambulatory Visit

## 2021-01-21 DIAGNOSIS — D225 Melanocytic nevi of trunk: Secondary | ICD-10-CM | POA: Diagnosis not present

## 2021-01-21 DIAGNOSIS — Z85828 Personal history of other malignant neoplasm of skin: Secondary | ICD-10-CM | POA: Diagnosis not present

## 2021-01-21 DIAGNOSIS — L814 Other melanin hyperpigmentation: Secondary | ICD-10-CM | POA: Diagnosis not present

## 2021-01-21 DIAGNOSIS — L918 Other hypertrophic disorders of the skin: Secondary | ICD-10-CM | POA: Diagnosis not present

## 2021-01-21 DIAGNOSIS — L821 Other seborrheic keratosis: Secondary | ICD-10-CM | POA: Diagnosis not present

## 2021-01-26 ENCOUNTER — Other Ambulatory Visit: Payer: Self-pay | Admitting: Family Medicine

## 2021-02-24 ENCOUNTER — Other Ambulatory Visit: Payer: Self-pay | Admitting: Family Medicine

## 2021-02-24 DIAGNOSIS — R739 Hyperglycemia, unspecified: Secondary | ICD-10-CM

## 2021-02-24 DIAGNOSIS — I10 Essential (primary) hypertension: Secondary | ICD-10-CM

## 2021-03-01 ENCOUNTER — Encounter (HOSPITAL_COMMUNITY): Payer: Self-pay | Admitting: Emergency Medicine

## 2021-03-01 ENCOUNTER — Emergency Department (HOSPITAL_COMMUNITY)
Admission: EM | Admit: 2021-03-01 | Discharge: 2021-03-01 | Disposition: A | Discharge status: Home / Self Care | Payer: Medicare Other | Attending: Emergency Medicine | Admitting: Emergency Medicine

## 2021-03-01 ENCOUNTER — Emergency Department (HOSPITAL_COMMUNITY): Payer: Medicare Other

## 2021-03-01 DIAGNOSIS — R110 Nausea: Secondary | ICD-10-CM | POA: Insufficient documentation

## 2021-03-01 DIAGNOSIS — R072 Precordial pain: Secondary | ICD-10-CM | POA: Diagnosis not present

## 2021-03-01 DIAGNOSIS — R0789 Other chest pain: Secondary | ICD-10-CM | POA: Diagnosis present

## 2021-03-01 DIAGNOSIS — I25119 Atherosclerotic heart disease of native coronary artery with unspecified angina pectoris: Secondary | ICD-10-CM | POA: Insufficient documentation

## 2021-03-01 DIAGNOSIS — Z85828 Personal history of other malignant neoplasm of skin: Secondary | ICD-10-CM | POA: Diagnosis not present

## 2021-03-01 DIAGNOSIS — Z87891 Personal history of nicotine dependence: Secondary | ICD-10-CM | POA: Insufficient documentation

## 2021-03-01 DIAGNOSIS — R200 Anesthesia of skin: Secondary | ICD-10-CM | POA: Insufficient documentation

## 2021-03-01 DIAGNOSIS — Z8521 Personal history of malignant neoplasm of larynx: Secondary | ICD-10-CM | POA: Insufficient documentation

## 2021-03-01 DIAGNOSIS — I10 Essential (primary) hypertension: Secondary | ICD-10-CM | POA: Diagnosis not present

## 2021-03-01 DIAGNOSIS — R079 Chest pain, unspecified: Secondary | ICD-10-CM | POA: Diagnosis not present

## 2021-03-01 LAB — BASIC METABOLIC PANEL
Anion gap: 6 (ref 5–15)
BUN: 14 mg/dL (ref 8–23)
CO2: 26 mmol/L (ref 22–32)
Calcium: 9.4 mg/dL (ref 8.9–10.3)
Chloride: 103 mmol/L (ref 98–111)
Creatinine, Ser: 1.12 mg/dL (ref 0.61–1.24)
GFR, Estimated: >60 mL/min (ref >60)
Glucose, Bld: 125 mg/dL — ABNORMAL HIGH (ref 70–99)
Potassium: 4 mmol/L (ref 3.5–5.1)
Sodium: 135 mmol/L (ref 135–145)

## 2021-03-01 LAB — CBC WITH DIFFERENTIAL/PLATELET
Abs Immature Granulocytes: 0.02 10*3/uL (ref 0.00–0.07)
Basophils Absolute: 0 10*3/uL (ref 0.0–0.1)
Basophils Relative: 1 %
Eosinophils Absolute: 0.1 10*3/uL (ref 0.0–0.5)
Eosinophils Relative: 3 %
HCT: 43 % (ref 39.0–52.0)
Hemoglobin: 14.3 g/dL (ref 13.0–17.0)
Immature Granulocytes: 1 %
Lymphocytes Relative: 24 %
Lymphs Abs: 0.9 10*3/uL (ref 0.7–4.0)
MCH: 30.9 pg (ref 26.0–34.0)
MCHC: 33.3 g/dL (ref 30.0–36.0)
MCV: 92.9 fL (ref 80.0–100.0)
Monocytes Absolute: 0.3 10*3/uL (ref 0.1–1.0)
Monocytes Relative: 9 %
Neutro Abs: 2.5 10*3/uL (ref 1.7–7.7)
Neutrophils Relative %: 62 %
Platelets: 204 10*3/uL (ref 150–400)
RBC: 4.63 MIL/uL (ref 4.22–5.81)
RDW: 12.7 % (ref 11.5–15.5)
WBC: 3.9 10*3/uL — ABNORMAL LOW (ref 4.0–10.5)
nRBC: 0 % (ref 0.0–0.2)

## 2021-03-01 LAB — TROPONIN I (HIGH SENSITIVITY)
Troponin I (High Sensitivity): 4 ng/L (ref <18)
Troponin I (High Sensitivity): 3 ng/L (ref <18)

## 2021-03-01 NOTE — Discharge Instructions (Addendum)
You were seen in the emergency department today with chest discomfort.  Your lab work did not show evidence of injury to your heart but I would like for you to call Dr. Tamala Julian this afternoon to schedule a follow-up appointment.  If you develop new or worsening symptoms you should return to the emergency department or call 911 if severe.

## 2021-03-01 NOTE — ED Notes (Addendum)
Report given to Noralyn Pick, RN

## 2021-03-01 NOTE — ED Provider Notes (Signed)
Emergency Department Provider Note   I have reviewed the triage vital signs and the nursing notes.   HISTORY  Chief Complaint Chest Pain   HPI Shane Shelton is a 75 y.o. male with PMH of CAD, HLD, and HTN presents to the emergency department for evaluation of momentary chest discomfort with nausea and tingling of the right fingers.  Patient was driving and on his way out of town when he had 1 to 2 seconds of chest tightness with some nausea.  He felt a tingling sensation in the fingers of his right hand.  He estimates this lasted for only several seconds and then passed without return.  He was not exerting himself.  He cannot think of a clear provoking factor.  He does note a history of CAD and notes his cardiologist is Dr. Tamala Julian.  With his history, he decided to present to the emergency department for further evaluation.  He denies any hemoptysis.  No fevers or chills.  No lingering symptoms.   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   Hx of adenomatous polyp of colon 02/12/2017   Pulmonary nodules/lesions, multiple 02/12/2017   Neuropathy 12/14/2016   Paresthesia of both legs 12/14/2016   History of skin cancer 12/14/2016   Hypercholesterolemia 10/05/2016   Coronary artery disease involving native coronary artery of native heart with angina pectoris ([place]) 10/05/2016   Hyperglycemia 10/05/2016   History of oral cancer 10/05/2016   GERD (gastroesophageal reflux disease) 10/05/2016   Lumbar back pain with radiculopathy affecting left lower extremity 10/05/2016     Past Surgical History:  Procedure Laterality Date   CARDIAC CATHETERIZATION  05/08/2017   S/P S/P PCI, DES of Mid RCA at [facility] in [place]   COLONOSCOPY     multiple   ESOPHAGOGASTRODUODENOSCOPY     multiple   ESOPHAGOSCOPY W/ PERCUTANEOUS GASTROSTOMY TUBE PLACEMENT  2011   INTRAVASCULAR PRESSURE WIRE/FFR STUDY N/A 10/09/2019   Procedure: INTRAVASCULAR PRESSURE WIRE/FFR STUDY;  Surgeon: Belva Crome, MD;  Location: [place] [facility];  Service: Cardiovascular;  Laterality: N/A;   LEFT HEART CATH AND CORONARY ANGIOGRAPHY N/A 10/09/2019   Procedure: LEFT HEART CATH AND CORONARY ANGIOGRAPHY;  Surgeon: Belva Crome, MD;  Location: [place] [facility];  Service: Cardiovascular;  Laterality: N/A;    Allergies Ambien [zolpidem], Gabapentin, and Statins  Family History  Problem Relation Age of Onset   Heart disease Mother    Diabetes Mother    Hyperlipidemia Mother    Cancer Father    Heart disease Brother    Cancer Brother    Hyperlipidemia Brother     Social History Social History   Tobacco Use   Smoking status: Former    Packs/day: 1.00    Years: 40.00    Pack years: 40.00    Types: Cigarettes    Quit date: 10/05/2009    Years since quitting: 11.4   Smokeless tobacco: Never  Vaping Use   Vaping Use: Never used  Substance Use Topics   Alcohol use: Yes    Alcohol/week:  7.0 standard drinks    Types: 7 Glasses of wine per week   Drug use: No    Review of Systems  Constitutional: No fever/chills Eyes: No visual changes. ENT: No sore throat. Cardiovascular: Positive chest pain. Respiratory: Denies shortness of breath. Gastrointestinal: No abdominal pain.  No nausea, no vomiting.  No diarrhea.  No constipation. Genitourinary: Negative for dysuria. Musculoskeletal: Negative for back pain. Skin: Negative for rash. Neurological: Negative for headaches, focal weakness or numbness. Tingling in the right fingers.   10-point ROS otherwise  negative.  ____________________________________________   PHYSICAL EXAM:  VITAL SIGNS: ED Triage Vitals  Enc Vitals Group     BP 03/01/21 1133 130/78     Pulse Rate 03/01/21 1133 (!) 51     Resp 03/01/21 1133 16     Temp 03/01/21 1133 98 F (36.7 C)     Temp Source 03/01/21 1133 Oral     SpO2 03/01/21 1133 97 %    Constitutional: Alert and oriented. Well appearing and in no acute distress. Eyes: Conjunctivae are normal.  Head: Atraumatic. Nose: No congestion/rhinnorhea. Mouth/Throat: Mucous membranes are moist.   Neck: No stridor.  Cardiovascular: Normal rate, regular rhythm. Good peripheral circulation. Grossly normal heart sounds.   Respiratory: Normal respiratory effort.  No retractions. Lungs CTAB. Gastrointestinal: Soft and nontender. No RUQ tenderness. No distention.  Musculoskeletal: No gross deformities of extremities. Neurologic:  Normal speech and language.  Skin:  Skin is warm, dry and intact. No rash noted.   ____________________________________________   LABS (all labs ordered are listed, but only abnormal results are displayed)  Labs Reviewed  BASIC METABOLIC PANEL - Abnormal; Notable for the following components:      Result Value   Glucose, Bld 125 (*)    All other components within normal limits  CBC WITH DIFFERENTIAL/PLATELET - Abnormal; Notable for the following components:   WBC 3.9 (*)    All other components within normal limits  CBG MONITORING, ED  TROPONIN I (HIGH SENSITIVITY)  TROPONIN I (HIGH SENSITIVITY)   ____________________________________________  EKG   EKG Interpretation  Date/Time:  Tuesday March 01 2021 11:38:10 EDT Ventricular Rate:  50 PR Interval:  180 QRS Duration: 92 QT Interval:  436 QTC Calculation: 397 R Axis:   73 Text Interpretation: Sinus bradycardia ST elevation, consider early repolarization Borderline ECG ST changes similar to 2021 tracing Confirmed by Nanda Quinton [id number]) on 03/01/2021 2:58:40 PM        EKG 2:    EKG Interpretation  Date/Time:  Tuesday March 01 2021 15:33:24 EDT Ventricular Rate:  48 PR Interval:  196 QRS Duration: 92 QT Interval:  461 QTC Calculation: 412 R Axis:   74 Text Interpretation: Sinus bradycardia Minimal ST elevation, inferior leads No dynamic changes Confirmed by Nanda Quinton [phone]) on 03/01/2021 3:35:49 PM        ____________________________________________  RADIOLOGY  DG Chest 2 View  Result Date: 03/01/2021 CLINICAL DATA:  Chest pain.  Right upper extremity paresthesias. EXAM: CHEST - 2 VIEW COMPARISON:  04/23/2020 chest CT FINDINGS: Upper normal heart size. Mild prominence of the pericardial adipose tissues. Coronary and aortic atherosclerosis. The lungs appear clear. No pleural effusion. No findings of pulmonary edema. IMPRESSION: 1. No acute findings. 2.  Aortic Atherosclerosis (ICD10-I70.0). Electronically Signed   By: Van Clines M.D.   On: 03/01/2021 12:35    ____________________________________________   PROCEDURES  Procedure(s) performed:   Procedures  None  ____________________________________________   INITIAL IMPRESSION / ASSESSMENT AND PLAN / ED  COURSE  Pertinent labs & imaging results that were available during my care of the patient were reviewed by me and considered in my medical decision making (see chart for details).   Patient presents to the emergency department with 1 to 2 seconds of chest discomfort with associated nausea and tingling in the right fingers.  Does have a CAD history.  The patient's overall description and duration of pain are not very suspicious for ACS. Not pleuritic to suspect PE. No epigastric pain.  Patient's EKG shows some nonspecific ST changes which do appear similar to a 2021 tracing.  He is not having active pain symptoms.  His initial troponin was 4.  Chest x-ray is clear.  Second troponin is in process.   03:35 PM  Second troponin unchanged.  Repeat EKG with no dynamic changes.  Plan  for outpatient cardiology follow-up.  Discussed ED return precautions. ____________________________________________  FINAL CLINICAL IMPRESSION(S) / ED DIAGNOSES  Final diagnoses:  Precordial chest pain    Note:  This document was prepared using Dragon voice recognition software and may include unintentional dictation errors.  Nanda Quinton, MD, [facility] Emergency Medicine    Ina Scrivens, [facility], MD 03/01/21 1539

## 2021-03-01 NOTE — ED Triage Notes (Signed)
Pt here from driving and had an episode of center chest pain and nausea and sob , along with right arm pain and numbness

## 2021-03-01 NOTE — ED Provider Notes (Signed)
Emergency Medicine Provider Triage Evaluation Note  Shane Shelton , a 75 y.o. male  was evaluated in triage.  Pt complains of central chest pain.  Also complaining of paresthesias in right upper arm and the right second and third fingertips.  This occurred while he was the passenger while his wife driving on their way to [place].  Denies any headache, numbness, weakness.  Review of Systems  Positive: Paresthesias, chest pain Negative: Headache, numbness, weakness  Physical Exam  BP 130/78 (BP Location: Right Arm)   Pulse (!) 51   Temp 98 F (36.7 C) (Oral)   Resp 16   SpO2 97%  Gen:   Awake, no distress   Resp:  Normal effort  MSK:   Moves extremities without difficulty  Other:  Strength 5/5 in bilateral upper and lower extremities.  Normal sensation to light touch of bilateral upper and lower extremities, face.  No facial asymmetry.  Medical Decision Making  Medically screening exam initiated at 11:46 AM.  Appropriate orders placed.  Shane Shelton was informed that the remainder of the evaluation will be completed by another provider, this initial triage assessment does not replace that evaluation, and the importance of remaining in the ED until their evaluation is complete.  No indication for code stroke as no neurological deficits.  Symptoms occurred 1.5 hours ago.  No current chest pain   Shane Heady, PA-C 03/01/21 1147    Shane Dusky, MD 03/01/21 1743

## 2021-03-02 NOTE — Progress Notes (Signed)
Cardiology Office Note:    Date:  03/03/2021   ID:  Shane Shelton, DOB [birth year], MRN [id number]  PCP:  Shane Resides, MD   [facility] Providers Cardiologist:  Shane Grooms, MD     Referring MD: Shane Resides, MD   CC: Urgent f/u CP visit  History of Present Illness:    Shane Shelton is a 75 y.o. male with a hx of CAD s/p PCI (2016 coming back from [place] (done in [place]), 2018 in [place]), HTN, HLD, R carotid and subclavian stenosis. In interim of his last 07/01/20 visit with Dr. Tamala Shelton, patient had recent CP visit in the ED. Seen 03/03/21.  Patient notes that he is going to [place], got int he care, was getting onto [facility] and had momentary  feeling like getting hit in his chest; associated with nausea and heaviness.  Felt some had numbness in right hand and wife wanted to be cautious. Had chest tightness 03/01/21.  ED evaluation- stable EKG.  Novel troponin WNL.  Over the last four months has felt great outside of this.  Has started pushing himself and the gym, lifting weights,.  No real chest pain unless he really pushes it has angina (his anginal equivalent is a different CP that comes into his left arm). No SOB/DOE and no PND/Orthopnea.  No weight gain or leg swelling.  No palpitations or syncope.  No PRN nitro needed.   Past Medical History:  Diagnosis Date   Cancer Shane Shelton)    Throat cancer 2011   Carotid artery occlusion    Coronary artery disease    Hyperlipidemia    Hypertension     Past Surgical History:  Procedure Laterality Date   CARDIAC CATHETERIZATION  05/08/2017   S/P S/P PCI, DES of Mid RCA at [facility] in [place]   COLONOSCOPY     multiple   ESOPHAGOGASTRODUODENOSCOPY     multiple   ESOPHAGOSCOPY W/ PERCUTANEOUS GASTROSTOMY TUBE PLACEMENT  2011   INTRAVASCULAR PRESSURE WIRE/FFR STUDY N/A 10/09/2019   Procedure: INTRAVASCULAR PRESSURE WIRE/FFR STUDY;  Surgeon: Shane Crome, MD;  Location: [place] [facility];  Service:  Cardiovascular;  Laterality: N/A;   LEFT HEART CATH AND CORONARY ANGIOGRAPHY N/A 10/09/2019   Procedure: LEFT HEART CATH AND CORONARY ANGIOGRAPHY;  Surgeon: Shane Crome, MD;  Location: [place] [facility];  Service: Cardiovascular;  Laterality: N/A;    Current Medications: Current Meds  Medication Sig   acetaminophen (TYLENOL) 500 MG tablet Take 1,000 mg by mouth every 6 (six) hours as needed (for pain.).   aspirin EC 81 MG tablet Take 81 mg by mouth every evening.    clopidogrel (PLAVIX) 75 MG tablet TAKE 1 TABLET BY MOUTH  DAILY   Coenzyme Q-10 200 MG CAPS Take 200 mg by mouth every evening.    isosorbide mononitrate (IMDUR) 60 MG 24 hr tablet Take 1 tablet (60 mg total) by mouth every evening.   losartan (COZAAR) 25 MG tablet Take 1 tablet (25 mg total) by mouth daily.   metoprolol succinate (TOPROL-XL) 25 MG 24 hr tablet Take 1 tablet (25 mg total) by mouth daily.   naproxen sodium (ALEVE) 220 MG tablet Take 220 mg by mouth 2 (two) times daily as needed (pain.).   nitroGLYCERIN (NITROSTAT) 0.4 MG SL tablet Place 1 tablet (0.4 mg total) under the tongue every 5 (five) minutes as needed for chest pain.   pantoprazole (PROTONIX) 40 MG tablet TAKE 1 TABLET BY MOUTH  DAILY AS NEEDED   REPATHA SURECLICK XX123456 MG/ML SOAJ INJECT '140MG'$  SUBCUTANEOUSLY EVERY 2 WEEKS   rosuvastatin (CRESTOR) 10 MG tablet Take 1 tablet (10 mg total) by mouth daily.     Allergies:   Ambien [zolpidem], Gabapentin, and Statins   Social History   Socioeconomic History   Marital status: Married    Spouse name: Shane Shelton   Number of children: Not on file   Years of education: Not on file   Highest education level: Doctorate  Occupational History   Occupation: Adult nurse    Comment: Retired  Tobacco Use   Smoking status: Former    Packs/day: 1.00    Years: 40.00    Pack years: 40.00    Types: Cigarettes    Quit date: 10/05/2009    Years since quitting: 11.4   Smokeless tobacco: Never  Vaping Use    Vaping Use: Never used  Substance and Sexual Activity   Alcohol use: Yes    Alcohol/week: 7.0 standard drinks    Types: 7 Glasses of wine per week   Drug use: No   Sexual activity: Yes    Comment: monagamous  Other Topics Concern   Not on file  Social History Narrative   Not on file   Social Determinants of Health   Financial Resource Strain: Not on file  Food Insecurity: Not on file  Transportation Needs: Not on file  Physical Activity: Not on file  Stress: Not on file  Social Connections: Not on file    Family History: The patient's family history includes Cancer in his brother and father; Diabetes in his mother; Heart disease in his brother and mother; Hyperlipidemia in his brother and mother.  ROS:   Please see the history of present illness.    All other systems reviewed and are negative.  EKGs/Labs/Other Studies Reviewed:    The following studies were reviewed today:  EKG:   03/02/21: sinus bradycardiac rate 50 with global ST elevations  NonCardiac CT: Date: 04/23/2020 Results: Aortic Atherosclerosis prior PCI  NM Stress Testing: Date: 08/13/2019 Results: Nuclear stress EF: 65%. The left ventricular ejection fraction is normal (55-65%). There was no ST segment deviation noted during stress. This is a low risk study. There is no evidence of ischemia or previous myocardial infarction. The study is normal.   Left/Right Heart Catheterizations: Date: 10/09/2019 Results: Segmental proximal 50 to 70% stenosis with irregularity and lucency.  DFR 0.93 and after contrast 0.89.  Distal LAD before bifurcating into a diagonal and small apical segment, 75 to 80%.  Could be the source of angina. Mid and distal left main 25%. Circumflex is relatively small with 3 obtuse marginal branches and no high-grade obstruction noted. RCA is dominant, previously stented throughout the mid segment.  Proximal to stents is a relatively lucent 30 to 50% narrowing.  There is also a 50%  distal RCA stenosis.  The PDA is large and widely patent.  The first left ventricular branch contains ostial 80% narrowing.  The remainder of the right coronary artery which is large is widely patent. Normal LV function.   RECOMMENDATIONS:   Uptitrate therapy including increasing Imdur to 60 mg/day. Add antiplatelet protection with Plavix. If anginal symptoms worsen, we may need to stent the LAD since it is borderline significant.  Not sure we could successfully treat the distal LAD without complications.  Possibly could benefit from cutting balloon. We will follow the patient closely. Cath was performed from left radial because of occlusion of the right  radial from prior angiography in [place].   Recent Labs: 04/12/2020: TSH 4.440 06/08/2020: ALT 14 03/01/2021: BUN 14; Creatinine, Ser 1.12; Hemoglobin 14.3; Platelets 204; Potassium 4.0; Sodium 135  Recent Lipid Panel    Component Value Date/Time   CHOL 70 (L) 06/08/2020 0744   TRIG 68 06/08/2020 0744   HDL 44 06/08/2020 0744   CHOLHDL 1.6 06/08/2020 0744   LDLCALC 11 06/08/2020 0744   LDLDIRECT 100 (H) 04/04/2017 1438    Physical Exam:    VS:  BP 110/62   Pulse 60   Ht '6\' 1"'$  (1.854 m)   Wt 218 lb (98.9 kg)   SpO2 97%   BMI 28.76 kg/m     Wt Readings from Last 3 Encounters:  03/03/21 218 lb (98.9 kg)  08/20/20 223 lb (101.2 kg)  07/01/20 220 lb 12.8 oz (100.2 kg)     GEN:  Well nourished, well developed in no acute distress HEENT: Normal NECK: No JVD; R Bruit LYMPHATICS: No lymphadenopathy CARDIAC: RRR, no murmurs, rubs, gallops RESPIRATORY:  Clear to auscultation without rales, wheezing or rhonchi  ABDOMEN: Soft, non-tender, non-distended MUSCULOSKELETAL:  No edema; No deformity  SKIN: Warm and dry NEUROLOGIC:  Alert and oriented x 3 PSYCHIATRIC:  Normal affect   ASSESSMENT:    1. Coronary artery disease involving native coronary artery of native heart with angina pectoris ([place])   2. Hypercholesterolemia   3.  Former smoker   4. Essential hypertension   5. Stenosis of right carotid artery    PLAN:    Coronary Artery Disease; Obstructive HTN  HLD and aortic atherosclerosis R carotid and subclavian stenosis; Prior R radial occlusion - asymptomatic (out of one event) - anatomy: RCA and LAD disease with complex distal LAD disease) - continue ASA 81 mg, given residual CP will not reduce plavix at this time - continue rosuvastatin 10 mg and repatha, goal LDL < 70 - continue succinate 25 mg- tolerating bradycardia without issue - Discussed Imdur increase, conservative monitoring (given he felt good on this regimen except for one episodes of atypical pain) and the risks and benefits of complex dLAD intervention - after patient discussion, will continue Imdur of 60 mg; will keep PRN nitrates and gave instructions on use; if he having symptoms limited disease we will increase his Imdur to 90 mg; he would be a candidate for Ranexa if needed - continue losartan 25 mg PO Daily  Has f/u with Dr. Tamala Shelton 04/01/21         Medication Adjustments/Labs and Tests Ordered: Current medicines are reviewed at length with the patient today.  Concerns regarding medicines are outlined above.  No orders of the defined types were placed in this encounter.  No orders of the defined types were placed in this encounter.   Patient Instructions  Medication Instructions:  Your physician recommends that you continue on your current medications as directed. Please refer to the Current Medication list given to you today.  *If you need a refill on your cardiac medications before your next appointment, please call your pharmacy*   Lab Work: NONE If you have labs (blood work) drawn today and your tests are completely normal, you will receive your results only by: [place] (if you have MyChart) OR A paper copy in the mail If you have any lab test that is abnormal or we need to change your treatment, we will call you  to review the results.   Testing/Procedures: NONE   Follow-Up: As scheduled At [facility], you and your health needs are our priority.  As part of our continuing mission to provide you with exceptional heart care, we have created designated Provider Care Teams.  These Care Teams include your primary Cardiologist (physician) and Advanced Practice Providers (APPs -  Physician Assistants and Nurse Practitioners) who all work together to provide you with the care you need, when you need it.      Signed, Werner Lean, MD  03/03/2021 10:19 AM    [facility]

## 2021-03-03 ENCOUNTER — Other Ambulatory Visit: Payer: Self-pay

## 2021-03-03 ENCOUNTER — Ambulatory Visit: Payer: Medicare Other | Admitting: Internal Medicine

## 2021-03-03 ENCOUNTER — Encounter: Payer: Self-pay | Admitting: Internal Medicine

## 2021-03-03 VITALS — BP 110/62 | HR 60 | Ht 73.0 in | Wt 218.0 lb

## 2021-03-03 DIAGNOSIS — Z87891 Personal history of nicotine dependence: Secondary | ICD-10-CM

## 2021-03-03 DIAGNOSIS — I25119 Atherosclerotic heart disease of native coronary artery with unspecified angina pectoris: Secondary | ICD-10-CM | POA: Diagnosis not present

## 2021-03-03 DIAGNOSIS — E7800 Pure hypercholesterolemia, unspecified: Secondary | ICD-10-CM

## 2021-03-03 DIAGNOSIS — I6521 Occlusion and stenosis of right carotid artery: Secondary | ICD-10-CM | POA: Diagnosis not present

## 2021-03-03 DIAGNOSIS — I10 Essential (primary) hypertension: Secondary | ICD-10-CM | POA: Diagnosis not present

## 2021-03-03 NOTE — Patient Instructions (Signed)
Medication Instructions:  Your physician recommends that you continue on your current medications as directed. Please refer to the Current Medication list given to you today.  *If you need a refill on your cardiac medications before your next appointment, please call your pharmacy*   Lab Work: NONE If you have labs (blood work) drawn today and your tests are completely normal, you will receive your results only by: MyChart Message (if you have MyChart) OR A paper copy in the mail If you have any lab test that is abnormal or we need to change your treatment, we will call you to review the results.   Testing/Procedures: NONE   Follow-Up: As scheduled  At [facility], you and your health needs are our priority.  As part of our continuing mission to provide you with exceptional heart care, we have created designated Provider Care Teams.  These Care Teams include your primary Cardiologist (physician) and Advanced Practice Providers (APPs -  Physician Assistants and Nurse Practitioners) who all work together to provide you with the care you need, when you need it.   

## 2021-03-30 NOTE — Progress Notes (Signed)
Cardiology Office Note:    Date:  03/30/2021   ID:  Shane, Shelton 1945-08-30, MRN [id number]  PCP:  Shane Resides, MD  Cardiologist:  Sinclair Grooms, MD   Referring MD: Shane Resides, MD   No chief complaint on file.   History of Present Illness:    Shane Shelton is a 75 y.o. male with a hx of hyperlipidemia, hypertension and bilateral LE claudication with negative doppler 01/2017. Has a long hx of CAD with PCI 2016 (on way back form [place]), recurrent angina 04/2017 with repeat cath in [place]. with proximal RCA and RCA PL DES and moderate residual proximal LAD and distal RCA disease. Cath 09/2019 with moderate LAD disease best treated with medication. 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  Recent sudden CP 03/01/2021 and had ER visit. Also had f/u at [place] with [facility] 03/03/2021. Medical therapy continued.   In early August, while beginning a drive to [place] had a funny sensation in the chest, became dizzy, somewhat sweaty, and tingling.  He pulled off and allowed his wife to start driving.  They eventually decided to come back to [place] from the [place] area and be evaluated in the emergency room.  The episode lasted 10 to 20 minutes before resolving.  By the time he arrived in the emergency room he was able to walk inside without assistance and felt well.  No oppressive chest discomfort.  Did not use sublingual nitroglycerin.  He saw Dr. Gasper Shelton the next day.  EKG was normal.  Evaluation in the emergency room the day before was unremarkable.  Since that time he has been back in his typical activity.  Does interval training.  Not having any significant angina requiring sublingual nitroglycerin.  He does have occasional dizziness.   Past Medical History:  Diagnosis Date   Cancer [facility])    Throat cancer 2011   Carotid artery occlusion    Coronary artery disease    Hyperlipidemia    Hypertension     Past  Surgical History:  Procedure Laterality Date   CARDIAC CATHETERIZATION  05/08/2017   S/P S/P PCI, DES of Mid RCA at [facility] in [place]   COLONOSCOPY     multiple   ESOPHAGOGASTRODUODENOSCOPY     multiple   ESOPHAGOSCOPY W/ PERCUTANEOUS GASTROSTOMY TUBE PLACEMENT  2011   INTRAVASCULAR PRESSURE WIRE/FFR STUDY N/A 10/09/2019   Procedure: INTRAVASCULAR PRESSURE WIRE/FFR STUDY;  Surgeon: Belva Crome, MD;  Location: [place] [facility];  Service: Cardiovascular;  Laterality: N/A;   LEFT HEART CATH AND CORONARY ANGIOGRAPHY N/A 10/09/2019   Procedure: LEFT HEART CATH AND CORONARY ANGIOGRAPHY;  Surgeon: Belva Crome, MD;  Location: [place] [facility];  Service: Cardiovascular;  Laterality: N/A;    Current Medications: No outpatient medications have been marked as taking for the 04/01/21 encounter (Appointment) with Belva Crome, MD.     Allergies:   Ambien [zolpidem], Gabapentin, and Statins   Social History   Socioeconomic History   Marital status: Married    Spouse name: Shane Shelton   Number of children: Not on file   Years of education: Not on file   Highest education level: Doctorate  Occupational History   Occupation: Adult nurse    Comment: Retired  Tobacco Use   Smoking status: Former    Packs/day: 1.00    Years: 40.00    Pack years: 40.00    Types: Cigarettes    Quit date: 10/05/2009  Years since quitting: 11.4   Smokeless tobacco: Never  Vaping Use   Vaping Use: Never used  Substance and Sexual Activity   Alcohol use: Yes    Alcohol/week: 7.0 standard drinks    Types: 7 Glasses of wine per week   Drug use: No   Sexual activity: Yes    Comment: monagamous  Other Topics Concern   Not on file  Social History Narrative   Not on file   Social Determinants of Health   Financial Resource Strain: Not on file  Food Insecurity: Not on file  Transportation Needs: Not on file  Physical Activity: Not on file  Stress: Not on file  Social  Connections: Not on file     Family History: The patient's family history includes Cancer in his brother and father; Diabetes in his mother; Heart disease in his brother and mother; Hyperlipidemia in his brother and mother.  ROS:   Please see the history of present illness.    He is seeing Dr. Donzetta Matters for carotid disease.  He is on yearly follow-up.  No side effects to his medicines that he can determine.  All other systems reviewed and are negative.  EKGs/Labs/Other Studies Reviewed:    The following studies were reviewed today: Coronary angiography 10/09/2019: Diagnostic Dominance: Right Intervention Segmental proximal 50 to 70% stenosis with irregularity and lucency.  DFR 0.93 and after contrast 0.89.  Distal LAD before bifurcating into a diagonal and small apical segment, 75 to 80%.  Could be the source of angina. Mid and distal left main 25%. Circumflex is relatively small with 3 obtuse marginal branches and no high-grade obstruction noted. RCA is dominant, previously stented throughout the mid segment.  Proximal to stents is a relatively lucent 30 to 50% narrowing.  There is also a 50% distal RCA stenosis.  The PDA is large and widely patent.  The first left ventricular branch contains ostial 80% narrowing.  The remainder of the right coronary artery which is large is widely patent. Normal LV function.   RECOMMENDATIONS:   Uptitrate therapy including increasing Imdur to 60 mg/day. Add antiplatelet protection with Plavix. If anginal symptoms worsen, we may need to stent the LAD since it is borderline significant.  Not sure we could successfully treat the distal LAD without complications.  Possibly could benefit from cutting balloon. We will follow the patient closely. Cath was performed from left radial because of occlusion of the right radial from prior angiography in [place].  EKG:  EKG normal sinus rhythm with small inferior Q waves 2, III, and aVF.  No change compared to prior  performed on 03/02/2021  Recent Labs: 04/12/2020: TSH 4.440 06/08/2020: ALT 14 03/01/2021: BUN 14; Creatinine, Ser 1.12; Hemoglobin 14.3; Platelets 204; Potassium 4.0; Sodium 135  Recent Lipid Panel    Component Value Date/Time   CHOL 70 (L) 06/08/2020 0744   TRIG 68 06/08/2020 0744   HDL 44 06/08/2020 0744   CHOLHDL 1.6 06/08/2020 0744   LDLCALC 11 06/08/2020 0744   LDLDIRECT 100 (H) 04/04/2017 1438    Physical Exam:    VS:  There were no vitals taken for this visit.    Wt Readings from Last 3 Encounters:  03/03/21 218 lb (98.9 kg)  08/20/20 223 lb (101.2 kg)  07/01/20 220 lb 12.8 oz (100.2 kg)     GEN: Healthy appearing, overweight.. No acute distress HEENT: Normal NECK: No JVD. LYMPHATICS: No lymphadenopathy CARDIAC: No murmur. RRR S4 without S3 gallop, or edema. VASCULAR:  Normal Pulses. No bruits. RESPIRATORY:  Clear to auscultation without rales, wheezing or rhonchi  ABDOMEN: Soft, non-tender, non-distended, No pulsatile mass, MUSCULOSKELETAL: No deformity  SKIN: Warm and dry NEUROLOGIC:  Alert and oriented x 3 PSYCHIATRIC:  Normal affect   ASSESSMENT:    1. Coronary artery disease involving native coronary artery of native heart with angina pectoris ([place])   2. Hypercholesterolemia   3. Former smoker   4. Essential hypertension   5. Stenosis of right carotid artery   6. Left ventricular hypertrophy   7. Premature ventricular contractions    PLAN:    In order of problems listed above:  Secondary prevention discussed.  Continue physical activity.  Notify us if any change in exertional tolerance or provocation of chest discomfort.  He cannot remember the last time the nitroglycerin was used.  Continue conservative management. Continue evolocumab.  Target LDL less than 70.  Most recent LDL was 11 in November 2021.  May be able to discontinue Crestor.  For the time being however I would not change anything. Not smoking  blood pressure is at target on the current  regimen of Cozaar 25 mg daily Toprol-XL 25 mg daily and Imdur also influences his blood pressure. Followed by Dr. Servando Snare No comments and no symptoms suggesting diastolic heart failure.  No evidence of volume overload.  Coronary imaging from 2021 was reviewed.  Overall education and awareness concerning secondary risk prevention was discussed in detail: LDL less than 70, hemoglobin A1c less than 7, blood pressure target less than 130/80 mmHg, >150 minutes of moderate aerobic activity per week, avoidance of smoking, weight control (via diet and exercise), and continued surveillance/management of/for obstructive sleep apnea.  Neuro clinic, high thank the recent episode requiring emergency room evaluation could have been an episode of vertigo.  If this continues to occur, may need to see neurology to make sure there is no posterior circulation disease.    Medication Adjustments/Labs and Tests Ordered: Current medicines are reviewed at length with the patient today.  Concerns regarding medicines are outlined above.  No orders of the defined types were placed in this encounter.  No orders of the defined types were placed in this encounter.   There are no Patient Instructions on file for this visit.   Signed, Sinclair Grooms, MD  03/30/2021 8:55 PM    [place]

## 2021-04-01 ENCOUNTER — Encounter: Payer: Self-pay | Admitting: Interventional Cardiology

## 2021-04-01 ENCOUNTER — Ambulatory Visit: Payer: Medicare Other | Admitting: Interventional Cardiology

## 2021-04-01 ENCOUNTER — Other Ambulatory Visit: Payer: Self-pay

## 2021-04-01 VITALS — BP 98/58 | HR 67 | Ht 73.0 in | Wt 219.6 lb

## 2021-04-01 DIAGNOSIS — I493 Ventricular premature depolarization: Secondary | ICD-10-CM | POA: Diagnosis not present

## 2021-04-01 DIAGNOSIS — Z87891 Personal history of nicotine dependence: Secondary | ICD-10-CM | POA: Diagnosis not present

## 2021-04-01 DIAGNOSIS — I25119 Atherosclerotic heart disease of native coronary artery with unspecified angina pectoris: Secondary | ICD-10-CM | POA: Diagnosis not present

## 2021-04-01 DIAGNOSIS — I10 Essential (primary) hypertension: Secondary | ICD-10-CM | POA: Diagnosis not present

## 2021-04-01 DIAGNOSIS — I6521 Occlusion and stenosis of right carotid artery: Secondary | ICD-10-CM

## 2021-04-01 DIAGNOSIS — E7800 Pure hypercholesterolemia, unspecified: Secondary | ICD-10-CM

## 2021-04-01 DIAGNOSIS — I517 Cardiomegaly: Secondary | ICD-10-CM

## 2021-04-01 NOTE — Patient Instructions (Signed)
Medication Instructions:  Your physician recommends that you continue on your current medications as directed. Please refer to the Current Medication list given to you today.  *If you need a refill on your cardiac medications before your next appointment, please call your pharmacy*   Lab Work: None If you have labs (blood work) drawn today and your tests are completely normal, you will receive your results only by: [place] (if you have MyChart) OR A paper copy in the mail If you have any lab test that is abnormal or we need to change your treatment, we will call you to review the results.   Testing/Procedures: None   Follow-Up: At [facility], you and your health needs are our priority.  As part of our continuing mission to provide you with exceptional heart care, we have created designated Provider Care Teams.  These Care Teams include your primary Cardiologist (physician) and Advanced Practice Providers (APPs -  Physician Assistants and Nurse Practitioners) who all work together to provide you with the care you need, when you need it.  We recommend signing up for the patient portal called "MyChart".  Sign up information is provided on this After Visit Summary.  MyChart is used to connect with patients for Virtual Visits (Telemedicine).  Patients are able to view lab/test results, encounter notes, upcoming appointments, etc.  Non-urgent messages can be sent to your provider as well.   To learn more about what you can do with MyChart, go to [email or web address].    Your next appointment:   9-12 month(s)  The format for your next appointment:   In Person  Provider:   You may see Sinclair Grooms, MD or one of the following Advanced Practice Providers on your designated Care Team:   Cecilie Kicks, NP   Other Instructions

## 2021-04-13 ENCOUNTER — Encounter: Payer: Self-pay | Admitting: Internal Medicine

## 2021-04-15 ENCOUNTER — Telehealth: Payer: Self-pay

## 2021-04-15 DIAGNOSIS — I10 Essential (primary) hypertension: Secondary | ICD-10-CM

## 2021-04-15 DIAGNOSIS — I25119 Atherosclerotic heart disease of native coronary artery with unspecified angina pectoris: Secondary | ICD-10-CM

## 2021-04-15 DIAGNOSIS — R739 Hyperglycemia, unspecified: Secondary | ICD-10-CM

## 2021-04-15 NOTE — Telephone Encounter (Signed)
Patient's wife calls nurse line to schedule lab appointments for Tuesday morning. Patient and wife have appointments with Dr. Andria Frames on Thursday morning. Patient is requesting to obtain fasting labs on Tuesday at 8:45. I have already scheduled this appointment.   Please place future orders for lab work.   Thanks.   Talbot Grumbling, RN

## 2021-04-17 NOTE — Telephone Encounter (Signed)
Future orders for both patient and wife ordered as requested.  Please notify patient.

## 2021-04-19 ENCOUNTER — Other Ambulatory Visit: Payer: Medicare Other

## 2021-04-19 ENCOUNTER — Other Ambulatory Visit: Payer: Self-pay

## 2021-04-19 DIAGNOSIS — I25119 Atherosclerotic heart disease of native coronary artery with unspecified angina pectoris: Secondary | ICD-10-CM

## 2021-04-19 DIAGNOSIS — R739 Hyperglycemia, unspecified: Secondary | ICD-10-CM | POA: Diagnosis not present

## 2021-04-19 DIAGNOSIS — I10 Essential (primary) hypertension: Secondary | ICD-10-CM

## 2021-04-20 LAB — TSH: TSH: 4.94 u[IU]/mL — ABNORMAL HIGH (ref 0.450–4.500)

## 2021-04-20 LAB — CMP14+EGFR
ALT: 15 IU/L (ref 0–44)
AST: 11 IU/L (ref 0–40)
Albumin/Globulin Ratio: 2.4 — ABNORMAL HIGH (ref 1.2–2.2)
Albumin: 4.4 g/dL (ref 3.7–4.7)
Alkaline Phosphatase: 43 IU/L — ABNORMAL LOW (ref 44–121)
BUN/Creatinine Ratio: 14 (ref 10–24)
BUN: 16 mg/dL (ref 8–27)
Bilirubin Total: 0.4 mg/dL (ref 0.0–1.2)
CO2: 25 mmol/L (ref 20–29)
Calcium: 9.4 mg/dL (ref 8.6–10.2)
Chloride: 102 mmol/L (ref 96–106)
Creatinine, Ser: 1.15 mg/dL (ref 0.76–1.27)
Globulin, Total: 1.8 g/dL (ref 1.5–4.5)
Glucose: 124 mg/dL — ABNORMAL HIGH (ref 65–99)
Potassium: 4.3 mmol/L (ref 3.5–5.2)
Sodium: 141 mmol/L (ref 134–144)
Total Protein: 6.2 g/dL (ref 6.0–8.5)
eGFR: 67 mL/min/{1.73_m2} (ref >59)

## 2021-04-20 LAB — LIPID PANEL
Chol/HDL Ratio: 1.5 ratio (ref 0.0–5.0)
Cholesterol, Total: 82 mg/dL — ABNORMAL LOW (ref 100–199)
HDL: 53 mg/dL (ref >39)
LDL Chol Calc (NIH): 16 mg/dL (ref 0–99)
Triglycerides: 49 mg/dL (ref 0–149)
VLDL Cholesterol Cal: 13 mg/dL (ref 5–40)

## 2021-04-20 LAB — CBC
Hematocrit: 44.2 % (ref 37.5–51.0)
Hemoglobin: 14.3 g/dL (ref 13.0–17.7)
MCH: 29.7 pg (ref 26.6–33.0)
MCHC: 32.4 g/dL (ref 31.5–35.7)
MCV: 92 fL (ref 79–97)
Platelets: 207 10*3/uL (ref 150–450)
RBC: 4.81 x10E6/uL (ref 4.14–5.80)
RDW: 12.8 % (ref 11.6–15.4)
WBC: 4.2 10*3/uL (ref 3.4–10.8)

## 2021-04-20 LAB — HEMOGLOBIN A1C
Est. average glucose Bld gHb Est-mCnc: 143 mg/dL
Hgb A1c MFr Bld: 6.6 % — ABNORMAL HIGH (ref 4.8–5.6)

## 2021-04-21 ENCOUNTER — Encounter: Payer: Self-pay | Admitting: Family Medicine

## 2021-04-21 ENCOUNTER — Ambulatory Visit (INDEPENDENT_AMBULATORY_CARE_PROVIDER_SITE_OTHER): Payer: Medicare Other | Admitting: Family Medicine

## 2021-04-21 ENCOUNTER — Other Ambulatory Visit: Payer: Self-pay

## 2021-04-21 VITALS — BP 122/68 | HR 57 | Ht 73.0 in | Wt 220.2 lb

## 2021-04-21 DIAGNOSIS — E1159 Type 2 diabetes mellitus with other circulatory complications: Secondary | ICD-10-CM

## 2021-04-21 DIAGNOSIS — Z0000 Encounter for general adult medical examination without abnormal findings: Secondary | ICD-10-CM | POA: Diagnosis not present

## 2021-04-21 DIAGNOSIS — I10 Essential (primary) hypertension: Secondary | ICD-10-CM | POA: Diagnosis not present

## 2021-04-21 DIAGNOSIS — Z86010 Personal history of colonic polyps: Secondary | ICD-10-CM | POA: Diagnosis not present

## 2021-04-21 DIAGNOSIS — Z860101 Personal history of adenomatous and serrated colon polyps: Secondary | ICD-10-CM

## 2021-04-21 DIAGNOSIS — Z23 Encounter for immunization: Secondary | ICD-10-CM

## 2021-04-21 NOTE — Patient Instructions (Addendum)
Your diabetes test, A1C = 6.6, which puts you in the diabetic range barely.  Historically, I would just recommend diet and exercise and weight loss.  There are two new classes of drugs: SGLT2 inhibitors and GLP1.  One is a shot once a week and the other is a once a day pill.   Come back in three months and we check your A1C again.   Get the shingles and new Covid booster.  Please let me know so that I can update your records.

## 2021-04-25 ENCOUNTER — Encounter: Payer: Self-pay | Admitting: Internal Medicine

## 2021-04-25 ENCOUNTER — Encounter: Payer: Self-pay | Admitting: Family Medicine

## 2021-04-25 DIAGNOSIS — E1159 Type 2 diabetes mellitus with other circulatory complications: Secondary | ICD-10-CM | POA: Insufficient documentation

## 2021-04-25 DIAGNOSIS — Z8639 Personal history of other endocrine, nutritional and metabolic disease: Secondary | ICD-10-CM | POA: Insufficient documentation

## 2021-04-25 DIAGNOSIS — R7303 Prediabetes: Secondary | ICD-10-CM | POA: Insufficient documentation

## 2021-04-25 NOTE — Assessment & Plan Note (Signed)
Refer back to GI. 

## 2021-04-25 NOTE — Assessment & Plan Note (Signed)
Male with now healthy lifestyle and he is managing his chronic illnesses well with diligence.

## 2021-04-25 NOTE — Assessment & Plan Note (Signed)
DM is new dx an A1C is at goal.  He plans to double down on lifestyle modifications.

## 2021-04-25 NOTE — Assessment & Plan Note (Signed)
At goal on current meds. 

## 2021-04-25 NOTE — Progress Notes (Signed)
    SUBJECTIVE:   CHIEF COMPLAINT / HPI:   Annual exam: Patient with very healthy lifestyle and an important burden of chronic disease.  Feels well.  I did labs before visit at his request.  Acute issues: none Chronic problems: CAD Followed closely by cards. LDL at goal (16).  No current Chest pain or DOE  History of oral cancer.  No lumps or bumps.  Quit smoiking.   New dx of DM.  +FHx.  A1C has just climbed into diabetic range.  He is at goal with A1C<7.  No med Rx required.   HBP tolerating meds well without any apparent side effects.   Hx of colon polyps.  Due for FU colonoscopy.  Lifestyle Exercises 1.5 hours per day.  Diet is generally healthy but could be improved slightly.  Wt also is BMI=29 - so could benefit from modest weight loss.  HPDP Will get flu shot today Will get bivalent COVID booster soon. Educated about shingrix vaccine.    PERTINENT  PMH / PSH: Denies CP, DOE, Abd pain, bleeding, worrisome skin lesions, and focal weakness.   Denies change in bowel, bladder, appetite or weight.  OBJECTIVE:   BP 122/68   Pulse (!) 57   Ht 6\' 1"  (1.854 m)   Wt 220 lb 3.2 oz (99.9 kg)   SpO2 97%   BMI 29.05 kg/m   HEENT WNL Neck supple Lungs clear Cardiac RRR without m or g Abd benign Ext No edema Neuro, Motor, sensory, gait, cognition and affect all grossly normal.  ASSESSMENT/PLAN:   Preventative health care Male with now healthy lifestyle and he is managing his chronic illnesses well with diligence.  DM type 2 causing vascular disease ([place]) DM is new dx an A1C is at goal.  He plans to double down on lifestyle modifications.  Hx of adenomatous polyp of colon Refer back to GI  Essential hypertension At goal on current meds     [name], MD [place]

## 2021-05-07 ENCOUNTER — Other Ambulatory Visit: Payer: Self-pay

## 2021-05-07 ENCOUNTER — Ambulatory Visit (HOSPITAL_COMMUNITY)
Admission: EM | Admit: 2021-05-07 | Discharge: 2021-05-07 | Disposition: A | Discharge status: Home / Self Care | Payer: Medicare Other

## 2021-06-28 ENCOUNTER — Ambulatory Visit (HOSPITAL_COMMUNITY)
Admission: RE | Admit: 2021-06-28 | Discharge: 2021-06-28 | Disposition: A | Discharge status: Home / Self Care | Payer: Medicare Other | Source: Ambulatory Visit | Attending: Cardiovascular Disease | Admitting: Cardiovascular Disease

## 2021-06-28 ENCOUNTER — Other Ambulatory Visit: Payer: Self-pay

## 2021-06-28 DIAGNOSIS — R0989 Other specified symptoms and signs involving the circulatory and respiratory systems: Secondary | ICD-10-CM | POA: Insufficient documentation

## 2021-06-30 ENCOUNTER — Ambulatory Visit: Payer: Medicare Other | Admitting: Internal Medicine

## 2021-06-30 ENCOUNTER — Telehealth: Payer: Self-pay

## 2021-06-30 ENCOUNTER — Encounter: Payer: Self-pay | Admitting: Internal Medicine

## 2021-06-30 VITALS — BP 128/70 | HR 77 | Ht 73.0 in | Wt 223.0 lb

## 2021-06-30 DIAGNOSIS — Z86010 Personal history of colonic polyps: Secondary | ICD-10-CM | POA: Diagnosis not present

## 2021-06-30 DIAGNOSIS — Z7902 Long term (current) use of antithrombotics/antiplatelets: Secondary | ICD-10-CM

## 2021-06-30 DIAGNOSIS — I2510 Atherosclerotic heart disease of native coronary artery without angina pectoris: Secondary | ICD-10-CM

## 2021-06-30 NOTE — Telephone Encounter (Signed)
Hi Dr. Tamala Julian,  Shane Shelton is scheduled to have a colonoscopy in February and is being asked to hold Plavix. He has a history of CAD with prior stenting to the RCA. Last cath in 09/2019 showed patent RCA stents and 70% stenosis (DFR 0.93) of proximal to mid LAD followed by 75% stenosis of distal LAD. Continued medical therapy was recommended at that time - Imdur was increased and Plavix was added. Also has right carotid and subclavian stenosis. You last saw him in 03/2021 at which time he reported occasional dizziness but was otherwise doing well from a cardiac standpoint. Can you please comment on how long Plavix can be held for colonoscopy?  Please route response back to [facility] PREOP.  Thank you! Angelicia Lessner

## 2021-06-30 NOTE — Progress Notes (Signed)
Shane Shelton 75 y.o. 16-Dec-1945 [id number]  Assessment & Plan:   Encounter Diagnoses  Name Primary?   Hx of adenomatous polyp of colon Yes   Coronary artery disease involving native coronary artery of native heart without angina pectoris    Long term current use of antithrombotics/antiplatelets - clopidogrel    Schedule colonoscopy for surveillance given history of precancerous polyps.  Hold clopidogrel 5 days prior to reduce the risk of bleeding.  The patient understands the rare but real increased of cardiac event off clopidogrel.  Clarify with cardiology that this is acceptable.  Continue aspirin therapy.   The risks and benefits as well as alternatives of endoscopic procedure(s) have been discussed and reviewed. All questions answered. The patient agrees to proceed.  CC: [name], MD    Subjective:   Chief Complaint: History of colon polyps, on clopidogrel question surveillance colonoscopy  HPI Patient is a 75 year old white man with a history of adenomatous colon polyp removal in 2015 while he lived in [place], as well as a rectal hyperplastic polyp.  There was a history of polyps prior to that per the report.  He is doing well without GI symptoms.  He takes clopidogrel for coronary artery disease and history of drug-eluting stents.  He is not having any active symptoms with respect to his heart at this time though he had some possible angina in August he went to the ER and then he saw cardiology after that in early August. Saw Dr. Tamala Julian in follow-up in September who thought he might of actually had vertigo and not an anginal event when he went to the ER in August.  Continue medical therapy.  The patient has not had recurrent events.  He has heartburn issues are reflux well controlled on PPI.  Allergies  Allergen Reactions   Ambien [Zolpidem] Other (See Comments)    Sleepwalking   Gabapentin Other (See Comments)    Tingling/lightheaded    Statins Other (See Comments)    Tingling and flatulence with atorvastatin and rosuvastatin.    Current Meds  Medication Sig   acetaminophen (TYLENOL) 500 MG tablet Take 1,000 mg by mouth every 6 (six) hours as needed (for pain.).   aspirin EC 81 MG tablet Take 81 mg by mouth every evening.    clopidogrel (PLAVIX) 75 MG tablet TAKE 1 TABLET BY MOUTH  DAILY   Coenzyme Q-10 200 MG CAPS Take 200 mg by mouth every evening.    isosorbide mononitrate (IMDUR) 60 MG 24 hr tablet Take 1 tablet (60 mg total) by mouth every evening.   losartan (COZAAR) 25 MG tablet Take 1 tablet (25 mg total) by mouth daily.   metoprolol succinate (TOPROL-XL) 25 MG 24 hr tablet Take 1 tablet (25 mg total) by mouth daily.   naproxen sodium (ALEVE) 220 MG tablet Take 220 mg by mouth 2 (two) times daily as needed (pain.).   nitroGLYCERIN (NITROSTAT) 0.4 MG SL tablet Place 1 tablet (0.4 mg total) under the tongue every 5 (five) minutes as needed for chest pain.   pantoprazole (PROTONIX) 40 MG tablet TAKE 1 TABLET BY MOUTH  DAILY AS NEEDED   REPATHA SURECLICK 226 MG/ML SOAJ INJECT 140MG  SUBCUTANEOUSLY EVERY 2 WEEKS   rosuvastatin (CRESTOR) 10 MG tablet Take 1 tablet (10 mg total) by mouth daily.   Past Medical History:  Diagnosis Date   Cancer [facility])    Throat cancer 2011   Carotid artery occlusion    Coronary artery disease  Hyperlipidemia    Hypertension    Tubular adenoma of colon 11/25/2013   Past Surgical History:  Procedure Laterality Date   CARDIAC CATHETERIZATION  05/08/2017   S/P S/P PCI, DES of Mid RCA at [facility] in [place]   COLONOSCOPY     multiple   ESOPHAGOGASTRODUODENOSCOPY     multiple   ESOPHAGOSCOPY W/ PERCUTANEOUS GASTROSTOMY TUBE PLACEMENT  2011   INTRAVASCULAR PRESSURE WIRE/FFR STUDY N/A 10/09/2019   Procedure: INTRAVASCULAR PRESSURE WIRE/FFR STUDY;  Surgeon: Belva Crome, MD;  Location: [facility];  Service: Cardiovascular;  Laterality: N/A;   LEFT HEART CATH  AND CORONARY ANGIOGRAPHY N/A 10/09/2019   Procedure: LEFT HEART CATH AND CORONARY ANGIOGRAPHY;  Surgeon: Belva Crome, MD;  Location: [facility];  Service: Cardiovascular;  Laterality: N/A;   Social History   Social History Narrative   Patient is married with 4 daughters   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   Review of Systems See HPI all other review of systems are negative  Objective:   Physical Exam BP 128/70   Pulse 77   Ht 6\' 1"  (1.854 m)   Wt 223 lb (101.2 kg)   SpO2 98%   BMI 29.42 kg/m  WDWN NAD Lungs cta Cor NL S1S2 no rmg Alert and oriented x 3

## 2021-06-30 NOTE — Patient Instructions (Signed)
You have been scheduled for a colonoscopy. Please follow written instructions given to you at your visit today.  Please pick up your prep supplies at the pharmacy within the next 1-3 days. If you use inhalers (even only as needed), please bring them with you on the day of your procedure.   Due to recent changes in healthcare laws, you may see the results of your imaging and laboratory studies on MyChart before your provider has had a chance to review them.  We understand that in some cases there may be results that are confusing or concerning to you. Not all laboratory results come back in the same time frame and the provider may be waiting for multiple results in order to interpret others.  Please give us 48 hours in order for your provider to thoroughly review all the results before contacting the office for clarification of your results.    I appreciate the opportunity to care for you. Carl Gessner, MD, FACG 

## 2021-06-30 NOTE — Telephone Encounter (Signed)
[facility] Pre-operative Risk Assessment     Request for surgical clearance:     Endoscopy Procedure  What type of surgery is being performed?     colonoscopy  When is this surgery scheduled?     09/15/2021  What type of clearance is required ?   Pharmacy  Are there any medications that need to be held prior to surgery and how long? Plavix, 5 days  Practice name and name of physician performing surgery?      [facility]  What is your office phone and fax number?      Phone- [phone]  [phone]  Anesthesia type (None, local, MAC, general) ?       MAC

## 2021-07-01 DIAGNOSIS — H11441 Conjunctival cysts, right eye: Secondary | ICD-10-CM | POA: Diagnosis not present

## 2021-07-01 DIAGNOSIS — H2513 Age-related nuclear cataract, bilateral: Secondary | ICD-10-CM | POA: Diagnosis not present

## 2021-07-01 DIAGNOSIS — H43391 Other vitreous opacities, right eye: Secondary | ICD-10-CM | POA: Diagnosis not present

## 2021-07-01 DIAGNOSIS — H31092 Other chorioretinal scars, left eye: Secondary | ICD-10-CM | POA: Diagnosis not present

## 2021-07-01 NOTE — Telephone Encounter (Signed)
   Primary Cardiologist: Sinclair Grooms, MD  Chart reviewed as part of pre-operative protocol coverage. Given past medical history and time since last visit, based on ACC/AHA guidelines, Shane Shelton would be at acceptable risk for the planned procedure without further cardiovascular testing.   His Plavix may be held for 5 days prior to his procedure.  Please resume as soon as hemostasis is achieved.  I will route this recommendation to the requesting party via Epic fax function and remove from pre-op pool.  Please call with questions.  Jossie Ng. Chandler Stofer NP-C    07/01/2021, 11:48 AM [address] Office [phone] Fax [phone]

## 2021-07-04 ENCOUNTER — Other Ambulatory Visit (HOSPITAL_COMMUNITY): Payer: Self-pay | Admitting: Interventional Cardiology

## 2021-07-04 ENCOUNTER — Telehealth: Payer: Self-pay

## 2021-07-04 DIAGNOSIS — I6523 Occlusion and stenosis of bilateral carotid arteries: Secondary | ICD-10-CM

## 2021-07-04 NOTE — Telephone Encounter (Signed)
Shane Shelton informed to hold plavix 5 days prior to his procedure on 09/15/21. We got permission from [name]. Shane Shelton verbalized understanding.

## 2021-07-17 ENCOUNTER — Other Ambulatory Visit: Payer: Self-pay | Admitting: Interventional Cardiology

## 2021-08-10 DIAGNOSIS — M79642 Pain in left hand: Secondary | ICD-10-CM | POA: Diagnosis not present

## 2021-09-09 ENCOUNTER — Encounter: Payer: Self-pay | Admitting: Internal Medicine

## 2021-09-15 ENCOUNTER — Other Ambulatory Visit: Payer: Self-pay

## 2021-09-15 ENCOUNTER — Ambulatory Visit (AMBULATORY_SURGERY_CENTER): Payer: Medicare Other | Admitting: Internal Medicine

## 2021-09-15 ENCOUNTER — Encounter: Payer: Self-pay | Admitting: Internal Medicine

## 2021-09-15 VITALS — BP 108/56 | HR 51 | Temp 97.8°F | Resp 13 | Ht 73.0 in | Wt 223.0 lb

## 2021-09-15 DIAGNOSIS — Z86010 Personal history of colonic polyps: Secondary | ICD-10-CM | POA: Diagnosis not present

## 2021-09-15 DIAGNOSIS — Z860101 Personal history of adenomatous and serrated colon polyps: Secondary | ICD-10-CM

## 2021-09-15 MED ORDER — SODIUM CHLORIDE 0.9 % IV SOLN: 500.0000 mL | Freq: Once | INTRAVENOUS | Status: DC

## 2021-09-15 NOTE — Progress Notes (Signed)
0816 HR < 40 with Robinul 0.2 mg given IV. MD updated, vss

## 2021-09-15 NOTE — Op Note (Signed)
[facility] Patient Name: Shane Shelton Procedure Date: 09/15/2021 7:33 AM MRN: [id number] Endoscopist: Gatha Mayer , MD Age: 76 Referring MD:  Date of Birth: 1945/10/27 Gender: Male Account #: [id number] Procedure:                Colonoscopy Indications:              Surveillance: Personal history of adenomatous                            polyps on last colonoscopy > 5 years ago Medicines:                Propofol per Anesthesia, Monitored Anesthesia Care Procedure:                Pre-Anesthesia Assessment:                           - Prior to the procedure, a History and Physical                            was performed, and patient medications and                            allergies were reviewed. The patient's tolerance of                            previous anesthesia was also reviewed. The risks                            and benefits of the procedure and the sedation                            options and risks were discussed with the patient.                            All questions were answered, and informed consent                            was obtained. Prior Anticoagulants: The patient                            last took Plavix (clopidogrel) 5 days prior to the                            procedure. ASA Grade Assessment: III - A patient                            with severe systemic disease. After reviewing the                            risks and benefits, the patient was deemed in                            satisfactory condition to undergo the procedure.  After obtaining informed consent, the colonoscope                            was passed under direct vision. Throughout the                            procedure, the patient's blood pressure, pulse, and                            oxygen saturations were monitored continuously. The                            Colonoscope was introduced through the anus and                             advanced to the the cecum, identified by                            appendiceal orifice and ileocecal valve. The                            colonoscopy was somewhat difficult due to                            significant looping. Successful completion of the                            procedure was aided by applying abdominal pressure.                            The patient tolerated the procedure well. The                            quality of the bowel preparation was good. The                            ileocecal valve, appendiceal orifice, and rectum                            were photographed. The bowel preparation used was                            Miralax via split dose instruction. Scope In: 8:05:56 AM Scope Out: 8:27:41 AM Scope Withdrawal Time: 0 hours 16 minutes 31 seconds  Total Procedure Duration: 0 hours 21 minutes 45 seconds  Findings:                 The perianal and digital rectal examinations were                            normal.                           Internal hemorrhoids were found.  Multiple diminutive flat white polyps in rectum and                            distal sigmoid that are consistent with                            hyperplastic polyps on imaging incluidng NBI and                            they also flatten with insufflation.                           Diverticula were found in the sigmoid colon.                           The exam was otherwise without abnormality on                            direct and retroflexion views. Complications:            No immediate complications. Estimated Blood Loss:     Estimated blood loss: none. Impression:               - Internal hemorrhoids.                           - Diverticulosis in the sigmoid colon.                           - Multiple rectal and distal sigmoid polyps with                            classic appearance of hyperplastic polyps (not                             pre-cancerous) and has been proven in past also.                           - The examination was otherwise normal on direct                            and retroflexion views.                           - No specimens collected.                           - Personal history of colonic polyps. at leat one                            adenoma in past Recommendation:           - Patient has a contact number available for                            emergencies. The signs and symptoms of potential  delayed complications were discussed with the                            patient. Return to normal activities tomorrow.                            Written discharge instructions were provided to the                            patient.                           - Resume previous diet.                           - Continue present medications.                           - Resume Plavix (clopidogrel) at prior dose today.                           - No repeat colonoscopy due to age. Gatha Mayer, MD 09/15/2021 8:39:10 AM This report has been signed electronically.

## 2021-09-15 NOTE — Progress Notes (Signed)
[facility] History and Physical   Primary Care Physician:  [name], MD   Reason for Procedure:   Hx colon polyps  Plan:    colonoscopy     HPI: Nylan Nevel is a 76 year old white man with a history of adenomatous colon polyp removal in 2015 while he lived in [place], as well as a rectal hyperplastic polyp.  There was a history of polyps prior to that per the report.  He is doing well without GI symptoms.  He takes clopidogrel for coronary artery disease and history of drug-eluting stents.  He is not having any active symptoms with respect to his heart at this time though he had some possible angina in August he went to the ER and then he saw cardiology after that in early August. Saw Dr. Tamala Julian in follow-up in September who thought he might of actually had vertigo and not an anginal event when he went to the ER in August.  Continue medical therapy.  The patient has not had recurrent events.   He has heartburn issues are reflux well controlled on PPI.    Past Medical History:  Diagnosis Date   Allergy    hayfever   Cancer [facility])    Throat cancer 2011   Carotid artery occlusion    Coronary artery disease    Hyperlipidemia    Hypertension    Tubular adenoma of colon 11/25/2013    Past Surgical History:  Procedure Laterality Date   CARDIAC CATHETERIZATION  05/08/2017   S/P S/P PCI, DES of Mid RCA at [facility] in [place]   COLONOSCOPY     multiple   ESOPHAGOGASTRODUODENOSCOPY     multiple   ESOPHAGOSCOPY W/ PERCUTANEOUS GASTROSTOMY TUBE PLACEMENT  2011   INTRAVASCULAR PRESSURE WIRE/FFR STUDY N/A 10/09/2019   Procedure: INTRAVASCULAR PRESSURE WIRE/FFR STUDY;  Surgeon: Belva Crome, MD;  Location: [facility];  Service: Cardiovascular;  Laterality: N/A;   LEFT HEART CATH AND CORONARY ANGIOGRAPHY N/A 10/09/2019   Procedure: LEFT HEART CATH AND CORONARY ANGIOGRAPHY;  Surgeon: Belva Crome, MD;  Location: [facility];  Service: Cardiovascular;  Laterality: N/A;    Prior to Admission medications   Medication Sig Start Date End Date Taking? Authorizing Provider  aspirin EC 81 MG tablet Take 81 mg by mouth every evening.    Yes [provider]  Coenzyme Q-10 200 MG CAPS Take 200 mg by mouth every evening.    Yes [provider]  isosorbide mononitrate (IMDUR) 60 MG 24 hr tablet TAKE 1 TABLET BY MOUTH IN  THE EVENING 07/18/21  Yes Belva Crome, MD  losartan (COZAAR) 25 MG tablet TAKE 1 TABLET BY MOUTH  DAILY 07/18/21  Yes Belva Crome, MD  metoprolol succinate (TOPROL-XL) 25 MG 24 hr tablet Take 1 tablet (25 mg total) by mouth daily. 09/13/20  Yes Belva Crome, MD  naproxen sodium (ALEVE) 220 MG tablet Take 220 mg by mouth 2 (two) times daily as needed (pain.).   Yes [provider]  pantoprazole (PROTONIX) 40 MG tablet TAKE 1 TABLET BY MOUTH  DAILY AS NEEDED 01/26/21  Yes Dickie La, MD  rosuvastatin (CRESTOR) 10 MG tablet TAKE 1 TABLET BY MOUTH  DAILY 07/18/21  Yes Belva Crome, MD  acetaminophen (TYLENOL) 500 MG tablet Take 1,000 mg by mouth every 6 (six) hours as needed (for pain.).    [provider]  clopidogrel (PLAVIX) 75 MG tablet TAKE 1  TABLET BY MOUTH  DAILY 07/18/21   Belva Crome, MD  nitroGLYCERIN (NITROSTAT) 0.4 MG SL tablet Place 1 tablet (0.4 mg total) under the tongue every 5 (five) minutes as needed for chest pain. 09/13/20 06/30/21  Belva Crome, MD  REPATHA SURECLICK 292 MG/ML SOAJ INJECT 140MG  SUBCUTANEOUSLY EVERY 2 WEEKS 11/12/20   Belva Crome, MD    Current Outpatient Medications  Medication Sig Dispense Refill   aspirin EC 81 MG tablet Take 81 mg by mouth every evening.      Coenzyme Q-10 200 MG CAPS Take 200 mg by mouth every evening.      isosorbide mononitrate (IMDUR) 60 MG 24 hr tablet TAKE 1 TABLET BY MOUTH IN  THE EVENING 90 tablet 3   losartan (COZAAR) 25 MG tablet TAKE 1 TABLET BY MOUTH  DAILY 90 tablet 3   metoprolol  succinate (TOPROL-XL) 25 MG 24 hr tablet Take 1 tablet (25 mg total) by mouth daily. 90 tablet 3   naproxen sodium (ALEVE) 220 MG tablet Take 220 mg by mouth 2 (two) times daily as needed (pain.).     pantoprazole (PROTONIX) 40 MG tablet TAKE 1 TABLET BY MOUTH  DAILY AS NEEDED 90 tablet 3   rosuvastatin (CRESTOR) 10 MG tablet TAKE 1 TABLET BY MOUTH  DAILY 90 tablet 3   acetaminophen (TYLENOL) 500 MG tablet Take 1,000 mg by mouth every 6 (six) hours as needed (for pain.).     clopidogrel (PLAVIX) 75 MG tablet TAKE 1 TABLET BY MOUTH  DAILY 90 tablet 3   nitroGLYCERIN (NITROSTAT) 0.4 MG SL tablet Place 1 tablet (0.4 mg total) under the tongue every 5 (five) minutes as needed for chest pain. 75 tablet 2   REPATHA SURECLICK 446 MG/ML SOAJ INJECT 140MG  SUBCUTANEOUSLY EVERY 2 WEEKS 6 mL 3   Current Facility-Administered Medications  Medication Dose Route Frequency Provider Last Rate Last Admin   0.9 %  sodium chloride infusion  500 mL Intravenous Once Gatha Mayer, MD        Allergies as of 09/15/2021 - Review Complete 09/15/2021  Allergen Reaction Noted   Ambien [zolpidem] Other (See Comments) 08/07/2019   Gabapentin Other (See Comments) 04/18/2017   Statins Other (See Comments) 12/28/2016    Family History  Problem Relation Age of Onset   Heart disease Mother    Diabetes Mother    Hyperlipidemia Mother    Cancer Father    Heart disease Brother    Cancer Brother    Hyperlipidemia Brother    Colon cancer Neg Hx    Colon polyps Neg Hx    Esophageal cancer Neg Hx    Stomach cancer Neg Hx    Rectal cancer Neg Hx     Social History   Socioeconomic History   Marital status: Married    Spouse name: Verdis Frederickson   Number of children: Not on file   Years of education: Not on file   Highest education level: Doctorate  Occupational History   Occupation: Adult nurse    Comment: Retired  Tobacco Use   Smoking status: Former    Packs/day: 1.00    Years: 40.00    Pack years: 40.00     Types: Cigarettes    Quit date: 10/05/2009    Years since quitting: 11.9   Smokeless tobacco: Never  Vaping Use   Vaping Use: Never used  Substance and Sexual Activity   Alcohol use: Yes    Alcohol/week: 7.0 standard drinks  Types: 7 Glasses of wine per week   Drug use: Never   Sexual activity: Yes    Comment: monagamous  Other Topics Concern   Not on file  Social History Narrative   Patient is married with 4 daughters   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  Physical Exam: Vital signs BP 119/60    Pulse (!) 46    Temp 97.8 F (36.6 C) (Temporal)    Resp 16    Ht 6\' 1"  (1.854 m)    Wt 223 lb (101.2 kg)    SpO2 98%    BMI 29.42 kg/m   General:   Alert,  Well-developed, well-nourished, pleasant and cooperative in NAD Lungs:  Clear throughout to auscultation.   Heart:  Regular rate and rhythm; no murmurs, clicks, rubs,  or gallops. Abdomen:  Soft, nontender and nondistended. Normal bowel sounds.   Neuro/Psych:  Alert and cooperative. Normal mood and affect. A and O x 3   @Neelam Tiggs  Simonne Maffucci, MD, [facility] [phone] (pager) 09/15/2021 8:00 AM@

## 2021-09-15 NOTE — Progress Notes (Signed)
Pt's states no medical or surgical changes since previsit or office visit.  VS CW  

## 2021-09-15 NOTE — Progress Notes (Signed)
No problems noted in the recovery room. maw 

## 2021-09-15 NOTE — Patient Instructions (Addendum)
No suspicious polyps seen. You do have tiny flat polyps in the rectum and end of sigmoid colon (described and biopsied in past) that are classic for polyps that are NOt pre-cancerous. Not removed.  I appreciate the opportunity to care for you. Gatha Mayer, MD, [facility]  Handouts were given to your care partner on diverticulosis and hemorrhoids. Resume your PLAVIX today. You may resume your current medications today. No repeat colonoscopy due to age. Please call if any questions or concerns.      YOU HAD AN ENDOSCOPIC PROCEDURE TODAY AT [facility]:   Refer to the procedure report that was given to you for any specific questions about what was found during the examination.  If the procedure report does not answer your questions, please call your gastroenterologist to clarify.  If you requested that your care partner not be given the details of your procedure findings, then the procedure report has been included in a sealed envelope for you to review at your convenience later.  YOU SHOULD EXPECT: Some feelings of bloating in the abdomen. Passage of more gas than usual.  Walking can help get rid of the air that was put into your GI tract during the procedure and reduce the bloating. If you had a lower endoscopy (such as a colonoscopy or flexible sigmoidoscopy) you may notice spotting of blood in your stool or on the toilet paper. If you underwent a bowel prep for your procedure, you may not have a normal bowel movement for a few days.  Please Note:  You might notice some irritation and congestion in your nose or some drainage.  This is from the oxygen used during your procedure.  There is no need for concern and it should clear up in a day or so.  SYMPTOMS TO REPORT IMMEDIATELY:  Following lower endoscopy (colonoscopy or flexible sigmoidoscopy):  Excessive amounts of blood in the stool  Significant tenderness or worsening of abdominal pains  Swelling of the abdomen that is new,  acute  Fever of 100F or higher   For urgent or emergent issues, a gastroenterologist can be reached at any hour by calling [phone]. Do not use MyChart messaging for urgent concerns.    DIET:  We do recommend a small meal at first, but then you may proceed to your regular diet.  Drink plenty of fluids but you should avoid alcoholic beverages for 24 hours.  ACTIVITY:  You should plan to take it easy for the rest of today and you should NOT DRIVE or use heavy machinery until tomorrow (because of the sedation medicines used during the test).    FOLLOW UP: Our staff will call the number listed on your records 48-72 hours following your procedure to check on you and address any questions or concerns that you may have regarding the information given to you following your procedure. If we do not reach you, we will leave a message.  We will attempt to reach you two times.  During this call, we will ask if you have developed any symptoms of COVID 19. If you develop any symptoms (ie: fever, flu-like symptoms, shortness of breath, cough etc.) before then, please call [phone].  If you test positive for Covid 19 in the 2 weeks post procedure, please call and report this information to [place].    If any biopsies were taken you will be contacted by phone or by letter within the next 1-3 weeks.  Please call us at [phone] if  you have not heard about the biopsies in 3 weeks.    SIGNATURES/CONFIDENTIALITY: You and/or your care partner have signed paperwork which will be entered into your electronic medical record.  These signatures attest to the fact that that the information above on your After Visit Summary has been reviewed and is understood.  Full responsibility of the confidentiality of this discharge information lies with you and/or your care-partner.

## 2021-09-15 NOTE — Progress Notes (Signed)
Report given to PACU, vss 

## 2021-09-20 ENCOUNTER — Telehealth: Payer: Self-pay

## 2021-09-20 NOTE — Telephone Encounter (Signed)
aQ Follow up Call-  Call back number 09/15/2021  Post procedure Call Back phone  # [phone]  Permission to leave phone message Yes  Some recent data might be hidden     Patient questions:  Do you have a fever, pain , or abdominal swelling? No. Pain Score  0 *  Have you tolerated food without any problems? Yes.    Have you been able to return to your normal activities? Yes.    Do you have any questions about your discharge instructions: Diet   No. Medications  No. Follow up visit  No.  Do you have questions or concerns about your Care? No.  Actions: * If pain score is 4 or above: No action needed, pain <4.

## 2021-10-12 ENCOUNTER — Other Ambulatory Visit: Payer: Self-pay | Admitting: Interventional Cardiology

## 2021-10-19 ENCOUNTER — Encounter: Payer: Self-pay | Admitting: Interventional Cardiology

## 2021-11-14 ENCOUNTER — Telehealth: Payer: Self-pay | Admitting: Family Medicine

## 2021-11-14 MED ORDER — NIRMATRELVIR/RITONAVIR (PAXLOVID)TABLET: 3.0000 | ORAL_TABLET | Freq: Two times a day (BID) | ORAL | 0 refills | Status: AC

## 2021-11-14 NOTE — Telephone Encounter (Signed)
**  After Hours/ Emergency Line Call** ? ?Received a call to report that Shane Shelton has tested positive for COVID and is requesting the oral antiviral treatment. He tested positive today and has had a cough and fever that started overnight. His Tmax was 101.57F, he has accompanying flu-like symptoms and rhinorrhea. Patient reports he has been vaccinated. He denies nausea, vomiting, abdominal pain diarrhea, shortness of breath, chest pain. Has only taken Robitussin and aleve. ? ?Dicussed with him risks of taking Paxlovid and he would like to proceed with the medication. Medications were reviewed and patient was instructed to discontinuing Rosuvastatin for the 5 days during treatment and for 5 days afterwards per the recommendations. Patient also informed that he should begin the medication within 5 days of symptom onset. Will send the medication to his regular pharmacy at this time, patient to make contact with our after hours line if they are not able to fill and we will send the prescription to the [facility] if there is an issue. ? ? ?Red flags discussed.  Will forward to PCP. ? ?Rise Patience, DO  ?PGY-2, [facility] ?11/14/2021 6:24 PM  ? ?

## 2021-11-15 NOTE — Telephone Encounter (Signed)
Contacted pt and gave him the below information.Shane Shelton, CMA ? ? ?McDiarmid, Shane Ohara, MD  Shane Shelton, Shane Shelton, CMA ?Caller: Unspecified (Today,  9:47 AM) ?Please let patient know that if he is feeling better, he need not start the Paxlovid.  ?Thank you  ?Todd ?

## 2021-11-15 NOTE — Telephone Encounter (Signed)
Patient calls nurse line in regards to [place].  ? ?Patient reports he would like to speak with PCP about the pros and cons of this medication.  ? ?Patient reports he is feeling better today, however is "on the fence" about starting medication.  ? ?Patient reports his main symptoms now are a runny nose and a dry cough. Patient denies any fevers in last 12 hours or SOB. ? ?Patient advised PCP is out of the office and medication is time sensitive.  ? ?Will forward to covering provider.  ?

## 2021-11-28 ENCOUNTER — Telehealth: Payer: Self-pay | Admitting: *Deleted

## 2021-11-28 NOTE — Chronic Care Management (AMB) (Signed)
?  Care Management  ? ?Note ? ?11/28/2021 ?Name: Jaeshawn Silvio MRN: [id number] DOB: 1945/08/19 ? ?Stanton Kissoon Threats is a 76 y.o. year old male who is a primary care patient of Hensel, Jamal Collin, MD. I reached out to [place] by phone today offer care coordination services.  ? ?Mr. [place] was given information about care management services today including:  ?Care management services include personalized support from designated clinical staff supervised by his physician, including individualized plan of care and coordination with other care providers ?24/7 contact phone numbers for assistance for urgent and routine care needs. ?The patient may stop care management services at any time by phone call to the office staff. ? ?Patient agreed to services and verbal consent obtained.  ? ?Follow up plan: ?Telephone appointment with care management team member scheduled for:11/30/21 ? ?Laverda Sorenson  ?Care Guide, Embedded Care Coordination ?[place]  Care Management  ?Direct Dial: [phone] ? ?

## 2021-11-30 ENCOUNTER — Ambulatory Visit: Payer: Medicare Other

## 2021-11-30 NOTE — Patient Instructions (Signed)
Visit Information ? ?Thank you for allowing me to share the care management and care coordination services that are available to you as part of your health plan and services through your primary care provider and medical home. Please reach out to me if the care management/care coordination team may be of assistance to you in the future.  ? ?Lazaro Arms RN, BSN, [facility] ?Care Management Coordinator ?[place]  ?Phone: [phone]  ?   ?

## 2021-11-30 NOTE — Chronic Care Management (AMB) (Signed)
? ?  RNCM ?Care Management Note ?11/30/2021 ?Name: Ford Peddie MRN: [id number] DOB: 18-Apr-1946 ? ? ?Khi Mcmillen Eckman is a 76 y.o. year old male who sees Hensel, Jamal Collin, MD for primary care. RNCM was consulted by [facility]  to assistance patient with  Care Coordination.   ? ?  ?Engaged with patient by telephone for initial visit in response to referral for High Risk List. ? ?Summary: During our initial meeting, I spoke with Mr. Lemay over the phone after being referred to me. As a representative of CCM, I introduced our program and he mentioned his degree in Neurobiology and daily exercise routine. He also expressed his awareness of the need to consult with his physician should any health concerns arise. Although he did not require our services presently, I will send him a letter to thank him for taking the call and let him know that he can reach out to [place] through his physician in the future if needed..  ? ?Recommendation: The patient may benefit from taking medications as prescribed, exercising as tolerated, continuing with his regiment., and The patient agrees. ? ?Follow up Plan: If further intervention is needed the care management team is available to follow up after a formal CCM referral is placed  and No follow up scheduled with CCM team at this time ? ? ?SDOH (Social Determinants of Health) screening and interventions performed today: No ? ?  ?Lazaro Arms RN, BSN, [place] ?Care Management Coordinator ?[place]  ?Phone: [phone]  ?  ? ?  ?

## 2021-12-12 ENCOUNTER — Encounter: Payer: Self-pay | Admitting: Family Medicine

## 2021-12-22 ENCOUNTER — Encounter: Payer: Self-pay | Admitting: Family Medicine

## 2021-12-22 ENCOUNTER — Ambulatory Visit (INDEPENDENT_AMBULATORY_CARE_PROVIDER_SITE_OTHER): Payer: Medicare Other | Admitting: Family Medicine

## 2021-12-22 VITALS — BP 153/60 | HR 47 | Ht 73.0 in | Wt 203.8 lb

## 2021-12-22 DIAGNOSIS — U099 Post covid-19 condition, unspecified: Secondary | ICD-10-CM

## 2021-12-22 DIAGNOSIS — I10 Essential (primary) hypertension: Secondary | ICD-10-CM | POA: Diagnosis not present

## 2021-12-22 DIAGNOSIS — E1159 Type 2 diabetes mellitus with other circulatory complications: Secondary | ICD-10-CM | POA: Diagnosis not present

## 2021-12-22 DIAGNOSIS — R109 Unspecified abdominal pain: Secondary | ICD-10-CM

## 2021-12-22 DIAGNOSIS — Z87891 Personal history of nicotine dependence: Secondary | ICD-10-CM

## 2021-12-22 DIAGNOSIS — R634 Abnormal weight loss: Secondary | ICD-10-CM | POA: Diagnosis not present

## 2021-12-22 LAB — POCT GLYCOSYLATED HEMOGLOBIN (HGB A1C): HbA1c, POC (controlled diabetic range): 5.9 % (ref 0.0–7.0)

## 2021-12-22 NOTE — Patient Instructions (Signed)
I will call with the lab test results.   If the worries persist, see me again and we will do more tests.

## 2021-12-22 NOTE — Progress Notes (Signed)
Wave of [place]ness [place] to [place]; went to ED with nothing  Mid-10/2021 Covid - Paxlovid; fever, cough which improved, light-headed/"in a fog" for around 5 days Improved, then 1-2 weeks ago, started getting some GI cramps, gas, and waves of lightheadedness assoc w cramping (similar to COVID light headedness; Persisted for a week, episodic, lasting seconds, sometimes associated with positional changes, some "fogginess"; was happening every day at first, over the past week fewer episodes of light headedness/cramping 1-3 ep; no spinning; some diarrhea with Covid + cramping following, has improved  Less anginal episodes; no SOB; feels healthy overall; no fever, headache; some residual cough  Is working out regularly, ab strength, balance; walking regularly 30 min a day; running some  Breakfast: fruit/yogurt smoothie; Lunch: sandwich to salad (or skipped); Dinner: lots of vegetables, moderate protein (4-6 oz meat, not a ton of red meat). Getting enough water, 84 oz  caffeine: coffee cup in morning  1-2 glasses of wine nightly  Taking meds regularly, as prescribed  Following with Cardio; appt in a couple weeks  CT lungs due  I was physically present and/or repeated all elements of the history nicely documented by MS .

## 2021-12-23 ENCOUNTER — Encounter: Payer: Self-pay | Admitting: Family Medicine

## 2021-12-23 DIAGNOSIS — R109 Unspecified abdominal pain: Secondary | ICD-10-CM

## 2021-12-23 DIAGNOSIS — R634 Abnormal weight loss: Secondary | ICD-10-CM

## 2021-12-23 DIAGNOSIS — U099 Post covid-19 condition, unspecified: Secondary | ICD-10-CM | POA: Insufficient documentation

## 2021-12-23 HISTORY — DX: Abnormal weight loss: R63.4

## 2021-12-23 HISTORY — DX: Unspecified abdominal pain: R10.9

## 2021-12-23 LAB — CMP14+EGFR
ALT: 15 IU/L (ref 0–44)
AST: 17 IU/L (ref 0–40)
Albumin/Globulin Ratio: 2.3 — ABNORMAL HIGH (ref 1.2–2.2)
Albumin: 4.9 g/dL — ABNORMAL HIGH (ref 3.7–4.7)
Alkaline Phosphatase: 47 IU/L (ref 44–121)
BUN/Creatinine Ratio: 12 (ref 10–24)
BUN: 13 mg/dL (ref 8–27)
Bilirubin Total: 0.6 mg/dL (ref 0.0–1.2)
CO2: 25 mmol/L (ref 20–29)
Calcium: 9.7 mg/dL (ref 8.6–10.2)
Chloride: 99 mmol/L (ref 96–106)
Creatinine, Ser: 1.1 mg/dL (ref 0.76–1.27)
Globulin, Total: 2.1 g/dL (ref 1.5–4.5)
Glucose: 121 mg/dL — ABNORMAL HIGH (ref 70–99)
Potassium: 4.9 mmol/L (ref 3.5–5.2)
Sodium: 140 mmol/L (ref 134–144)
Total Protein: 7 g/dL (ref 6.0–8.5)
eGFR: 70 mL/min/{1.73_m2} (ref >59)

## 2021-12-23 LAB — CBC
Hematocrit: 45.5 % (ref 37.5–51.0)
Hemoglobin: 15 g/dL (ref 13.0–17.7)
MCH: 30.7 pg (ref 26.6–33.0)
MCHC: 33 g/dL (ref 31.5–35.7)
MCV: 93 fL (ref 79–97)
Platelets: 235 10*3/uL (ref 150–450)
RBC: 4.89 x10E6/uL (ref 4.14–5.80)
RDW: 13.2 % (ref 11.6–15.4)
WBC: 3.1 10*3/uL — ABNORMAL LOW (ref 3.4–10.8)

## 2021-12-23 LAB — TSH: TSH: 3.41 u[IU]/mL (ref 0.450–4.500)

## 2021-12-23 NOTE — Assessment & Plan Note (Signed)
I do not want to push harder on antihypertensives given low diastolic and lightheaded spells.

## 2021-12-23 NOTE — Assessment & Plan Note (Signed)
Screening labs are reassuring.

## 2021-12-23 NOTE — Assessment & Plan Note (Signed)
Cramping, non specific pain.  Screening labs are reassuring.  Observe.

## 2021-12-23 NOTE — Assessment & Plan Note (Signed)
It is reasonable but not proven to attribute all symptoms to long COVID.

## 2021-12-23 NOTE — Progress Notes (Signed)
    SUBJECTIVE:   CHIEF COMPLAINT / HPI:   See note of MS Namecek.  Vague constellation of symptoms after a bout of COVID in April.  I would put symptoms under three clusters. Weight loss.  He has been working out more but not so much more to explain the weight loss. Vague GI cramping.  No diarrhea or vomiting.  Just crampy feeling Spells of lightheadedness.  These actually preceded the COVID.   He has know CAD.  Also known cerebrovascular disease - bilateral carotid stenosis Right>left.  On appropriate secondary prevention meds.    OBJECTIVE:   BP (!) 153/60   Pulse (!) 47   Ht '6\' 1"'$  (1.854 m)   Wt 203 lb 12.8 oz (92.4 kg)   SpO2 97%   BMI 26.89 kg/m   BP (high systolic and lowish diastolic noted.) Lungs clear Cardiac RRR without m or g Abd benign.  ASSESSMENT/PLAN:   Weight loss Screening labs are reassuring.  Essential hypertension I do not want to push harder on antihypertensives given low diastolic and lightheaded spells.  Abdominal pain Cramping, non specific pain.  Screening labs are reassuring.  Observe.  Long COVID It is reasonable but not proven to attribute all symptoms to long COVID.     [name], MD [place]

## 2021-12-26 ENCOUNTER — Other Ambulatory Visit: Payer: Self-pay | Admitting: Interventional Cardiology

## 2021-12-26 DIAGNOSIS — E7800 Pure hypercholesterolemia, unspecified: Secondary | ICD-10-CM

## 2022-01-03 ENCOUNTER — Encounter: Payer: Self-pay | Admitting: *Deleted

## 2022-01-17 ENCOUNTER — Ambulatory Visit
Admission: RE | Admit: 2022-01-17 | Discharge: 2022-01-17 | Disposition: A | Discharge status: Home / Self Care | Payer: Medicare Other | Source: Ambulatory Visit | Attending: Family Medicine | Admitting: Family Medicine

## 2022-01-17 ENCOUNTER — Encounter: Payer: Self-pay | Admitting: Family Medicine

## 2022-01-17 DIAGNOSIS — I700 Atherosclerosis of aorta: Secondary | ICD-10-CM | POA: Diagnosis not present

## 2022-01-17 DIAGNOSIS — J439 Emphysema, unspecified: Secondary | ICD-10-CM | POA: Diagnosis not present

## 2022-01-17 DIAGNOSIS — I2510 Atherosclerotic heart disease of native coronary artery without angina pectoris: Secondary | ICD-10-CM | POA: Diagnosis not present

## 2022-01-17 DIAGNOSIS — Z87891 Personal history of nicotine dependence: Secondary | ICD-10-CM

## 2022-01-17 DIAGNOSIS — H8110 Benign paroxysmal vertigo, unspecified ear: Secondary | ICD-10-CM | POA: Insufficient documentation

## 2022-01-23 NOTE — Progress Notes (Signed)
Cardiology Office Note:    Date:  01/26/2022   ID:  Shane Shelton, Shane Shelton 05-Apr-1946, MRN [id number]  PCP:  [name], MD  Cardiologist:  Sinclair Grooms, MD   Referring MD: [name], MD   Chief Complaint  Patient presents with   Coronary Artery Disease   Hypertension   Hyperlipidemia    History of Present Illness:    Shane Shelton is a 76 y.o. male with a hx of hyperlipidemia, hypertension and bilateral LE claudication with negative doppler 01/2017. Has a long hx of CAD with PCI 2016 (on way back form [place]), recurrent angina 04/2017 with repeat cath in [place]. with proximal RCA and RCA PL DES and moderate residual proximal LAD and distal RCA disease. Cath 09/2019 with moderate LAD disease best treated with medication. 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  Timoteo is doing well.  No anginal symptoms.  He did develop vertigo after viral syndrome and was diagnosed with benign positional vertigo.  He has lost nearly 30 pounds since seeing him last.  He correlates this with increased physical activity and change in diet.  His appetite is good.  Significantly increased aerobic activity.  Past Medical History:  Diagnosis Date   Allergy    hayfever   Cancer [facility])    Throat cancer 2011   Carotid artery occlusion    Coronary artery disease    Hyperlipidemia    Hypertension    Tubular adenoma of colon 11/25/2013    Past Surgical History:  Procedure Laterality Date   CARDIAC CATHETERIZATION  05/08/2017   S/P S/P PCI, DES of Mid RCA at [facility] in [place]   COLONOSCOPY     multiple   ESOPHAGOGASTRODUODENOSCOPY     multiple   ESOPHAGOSCOPY W/ PERCUTANEOUS GASTROSTOMY TUBE PLACEMENT  2011   INTRAVASCULAR PRESSURE WIRE/FFR STUDY N/A 10/09/2019   Procedure: INTRAVASCULAR PRESSURE WIRE/FFR STUDY;  Surgeon: Belva Crome, MD;  Location: [facility];  Service: Cardiovascular;  Laterality: N/A;   LEFT HEART CATH AND  CORONARY ANGIOGRAPHY N/A 10/09/2019   Procedure: LEFT HEART CATH AND CORONARY ANGIOGRAPHY;  Surgeon: Belva Crome, MD;  Location: [facility];  Service: Cardiovascular;  Laterality: N/A;    Current Medications: Current Meds  Medication Sig   acetaminophen (TYLENOL) 500 MG tablet Take 1,000 mg by mouth every 6 (six) hours as needed (for pain.).   aspirin EC 81 MG tablet Take 81 mg by mouth every evening.    clopidogrel (PLAVIX) 75 MG tablet TAKE 1 TABLET BY MOUTH  DAILY   Coenzyme Q-10 200 MG CAPS Take 200 mg by mouth every evening.    isosorbide mononitrate (IMDUR) 60 MG 24 hr tablet TAKE 1 TABLET BY MOUTH IN  THE EVENING   losartan (COZAAR) 25 MG tablet TAKE 1 TABLET BY MOUTH  DAILY   metoprolol succinate (TOPROL-XL) 25 MG 24 hr tablet TAKE 1 TABLET BY MOUTH  DAILY   naproxen sodium (ALEVE) 220 MG tablet Take 220 mg by mouth 2 (two) times daily as needed (pain.).   nitroGLYCERIN (NITROSTAT) 0.4 MG SL tablet Place 1 tablet (0.4 mg total) under the tongue every 5 (five) minutes as needed for chest pain.   pantoprazole (PROTONIX) 40 MG tablet TAKE 1 TABLET BY MOUTH  DAILY AS NEEDED   REPATHA SURECLICK 672 MG/ML SOAJ INJECT '140MG'$  SUBCUTANEOUSLY EVERY 2 WEEKS   rosuvastatin (CRESTOR) 10 MG tablet TAKE 1 TABLET BY MOUTH  DAILY  Allergies:   Ambien [zolpidem], Gabapentin, and Statins   Social History   Socioeconomic History   Marital status: Married    Spouse name: Verdis Frederickson   Number of children: Not on file   Years of education: Not on file   Highest education level: Doctorate  Occupational History   Occupation: Adult nurse    Comment: Retired  Tobacco Use   Smoking status: Former    Packs/day: 1.00    Years: 40.00    Total pack years: 40.00    Types: Cigarettes    Quit date: 10/05/2009    Years since quitting: 12.3   Smokeless tobacco: Never  Vaping Use   Vaping Use: Never used  Substance and Sexual Activity   Alcohol use: Yes    Alcohol/week: 7.0 standard  drinks of alcohol    Types: 7 Glasses of wine per week   Drug use: Never   Sexual activity: Yes    Comment: monagamous  Other Topics Concern   Not on file  Social History Narrative   Patient is married with 4 daughters   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   1 alcoholic beverage a day 1 caffeinated beverage daily former smoker no tobacco or drug use now   Social Determinants of Radio broadcast assistant Strain: Not on file  Food Insecurity: Not on file  Transportation Needs: Not on file  Physical Activity: Not on file  Stress: No Stress Concern Present (06/05/2017)   [employer]    Feeling of Stress : Not at all  Social Connections: Not on file     Family History: The patient's family history includes Cancer in his brother and father; Diabetes in his mother; Heart disease in his brother and mother; Hyperlipidemia in his brother and mother. There is no history of Colon cancer, Colon polyps, Esophageal cancer, Stomach cancer, or Rectal cancer.  ROS:   Please see the history of present illness.    Had COVID 3 months ago and a subsequent viral syndrome associated with vertigo.  He has over both.  All other systems reviewed and are negative.  EKGs/Labs/Other Studies Reviewed:    The following studies were reviewed today:  BILATERAL CAROTID DOPPLER  05/2021: Summary:  Right Carotid: Velocities in the right ICA are consistent with a 60-79%                 stenosis. Significant plaque burden noted.   Left Carotid: Velocities in the left ICA are consistent with a 1-39%  stenosis.                Non-hemodynamically significant plaque <50% noted in the  CCA.   Vertebrals:  Bilateral vertebral arteries demonstrate antegrade flow.  Subclavians: Bilateral subclavian arteries were stenotic.   EKG:  EKG   A new  tracing is not obtained today.  Recent Labs: 12/22/2021: ALT 15; BUN 13; Creatinine, Ser 1.10; Hemoglobin 15.0; Platelets 235; Potassium 4.9; Sodium 140; TSH 3.410  Recent Lipid Panel    Component Value Date/Time   CHOL 82 (L) 04/19/2021 0904   TRIG 49 04/19/2021 0904   HDL 53 04/19/2021 0904   CHOLHDL 1.5 04/19/2021 0904   LDLCALC 16 04/19/2021 0904   LDLDIRECT 100 (H) 04/04/2017 1438    Physical Exam:    VS:  BP 118/60   Pulse (!) 49  Ht '6\' 1"'$  (1.854 m)   Wt 203 lb 12.8 oz (92.4 kg)   SpO2 96%   BMI 26.89 kg/m     Wt Readings from Last 3 Encounters:  01/26/22 203 lb 12.8 oz (92.4 kg)  12/22/21 203 lb 12.8 oz (92.4 kg)  09/15/21 223 lb (101.2 kg)     GEN: Healthy. No acute distress HEENT: Normal NECK: No JVD. LYMPHATICS: No lymphadenopathy CARDIAC: No murmur. RRR no gallop, or edema. VASCULAR:  Normal Pulses. No bruits. RESPIRATORY:  Clear to auscultation without rales, wheezing or rhonchi  ABDOMEN: Soft, non-tender, non-distended, No pulsatile mass, MUSCULOSKELETAL: No deformity  SKIN: Warm and dry NEUROLOGIC:  Alert and oriented x 3 PSYCHIATRIC:  Normal affect   ASSESSMENT:    1. Coronary artery disease involving native coronary artery of native heart with angina pectoris ([place])   2. Carotid stenosis, right   3. Hypercholesterolemia   4. Essential hypertension   5. Former smoker    PLAN:    In order of problems listed above:  Secondary prevention reviewed. Faint right carotid bruit is heard.  See recent carotid Doppler above.  This needs to be repeated a year and. Continue aggressive lipid-lowering with LDL target less than 70.  In September 2022 it was 16 on rosuvastatin 10 mg/day and Repatha.  Repatha is needed because he is high risk with multiple vascular territories involved. Blood pressure control is excellent.  Continue Toprol-XL and Cozaar. Does not smoke  Needs carotid Doppler in November  Overall education and awareness concerning secondary  risk prevention was discussed in detail: LDL less than 70, hemoglobin A1c less than 7, blood pressure target less than 130/80 mmHg, >150 minutes of moderate aerobic activity per week, avoidance of smoking, weight control (via diet and exercise), and continued surveillance/management of/for obstructive sleep apnea.    Medication Adjustments/Labs and Tests Ordered: Current medicines are reviewed at length with the patient today.  Concerns regarding medicines are outlined above.  No orders of the defined types were placed in this encounter.  No orders of the defined types were placed in this encounter.   Patient Instructions  Medication Instructions:  Your physician recommends that you continue on your current medications as directed. Please refer to the Current Medication list given to you today.  *If you need a refill on your cardiac medications before your next appointment, please call your pharmacy*  Lab Work: NONE  Testing/Procedures: NONE  Follow-Up: At [facility], you and your health needs are our priority.  As part of our continuing mission to provide you with exceptional heart care, we have created designated Provider Care Teams.  These Care Teams include your primary Cardiologist (physician) and Advanced Practice Providers (APPs -  Physician Assistants and Nurse Practitioners) who all work together to provide you with the care you need, when you need it.  Your next appointment:   1 year(s)  The format for your next appointment:   In Person  Provider:   Sinclair Grooms, MD {  Important Information About Sugar         Signed, Sinclair Grooms, MD  01/26/2022 9:28 AM    [place]

## 2022-01-24 ENCOUNTER — Ambulatory Visit: Payer: Medicare Other | Attending: Family Medicine

## 2022-01-24 DIAGNOSIS — H8111 Benign paroxysmal vertigo, right ear: Secondary | ICD-10-CM | POA: Insufficient documentation

## 2022-01-24 DIAGNOSIS — R42 Dizziness and giddiness: Secondary | ICD-10-CM | POA: Insufficient documentation

## 2022-01-24 NOTE — Therapy (Signed)
OUTPATIENT PHYSICAL THERAPY VESTIBULAR EVALUATION     Patient Name: Shane Shelton MRN: [id number] DOB:Dec 22, 1945, 76 y.o., male Today's Date: 01/24/2022  PCP: Moses Manners, MD REFERRING PROVIDER: Moses Manners, MD   PT End of Session - 01/24/22 1329     Visit Number 1    Number of Visits 5    Date for PT Re-Evaluation 02/24/22    Authorization Type U[employer] Medicare (10th Visit PN)    PT Start Time 1329    PT Stop Time 1358    PT Time Calculation (min) 29 min    Activity Tolerance Patient tolerated treatment well    Behavior During Therapy [facility] for tasks assessed/performed             Past Medical History:  Diagnosis Date   Allergy    hayfever   Cancer (HCC)    Throat cancer 2011   Carotid artery occlusion    Coronary artery disease    Hyperlipidemia    Hypertension    Tubular adenoma of colon 11/25/2013   Past Surgical History:  Procedure Laterality Date   CARDIAC CATHETERIZATION  05/08/2017   S/P S/P PCI, DES of Mid RCA at [facility] in [place]   COLONOSCOPY     multiple   ESOPHAGOGASTRODUODENOSCOPY     multiple   ESOPHAGOSCOPY W/ PERCUTANEOUS GASTROSTOMY TUBE PLACEMENT  2011   INTRAVASCULAR PRESSURE WIRE/FFR STUDY N/A 10/09/2019   Procedure: INTRAVASCULAR PRESSURE WIRE/FFR STUDY;  Surgeon: Lyn Records, MD;  Location: [facility] INVASIVE [facility];  Service: Cardiovascular;  Laterality: N/A;   LEFT HEART CATH AND CORONARY ANGIOGRAPHY N/A 10/09/2019   Procedure: LEFT HEART CATH AND CORONARY ANGIOGRAPHY;  Surgeon: Lyn Records, MD;  Location: [facility] INVASIVE CV [facility];  Service: Cardiovascular;  Laterality: N/A;   Patient Active Problem List   Diagnosis Date Noted   BPPV (benign paroxysmal positional vertigo) 01/17/2022   Weight loss 12/23/2021   Abdominal pain 12/23/2021   Long COVID 12/23/2021   DM type 2 causing vascular disease (HCC) 04/25/2021   Achilles tendinitis 03/30/2020   Preventative health care 03/28/2019   Former smoker  03/28/2019   Essential hypertension 05/17/2017   Premature ventricular contractions 02/14/2017   Left ventricular hypertrophy 02/14/2017   Hx of adenomatous polyp of colon 02/12/2017   Pulmonary nodules/lesions, multiple 02/12/2017   Neuropathy 12/14/2016   Paresthesia of both legs 12/14/2016   History of skin cancer 12/14/2016   Hypercholesterolemia 10/05/2016   Coronary artery disease 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) 10/05/2016   History of oral cancer 10/05/2016   GERD (gastroesophageal reflux disease) 10/05/2016   Lumbar back pain with radiculopathy affecting left lower extremity 10/05/2016    ONSET DATE: 01/23/22  REFERRING DIAG: H81.10 (ICD-10-CM) - Benign paroxysmal positional vertigo, unspecified laterality  THERAPY DIAG:  BPPV (benign paroxysmal positional vertigo), right  Dizziness and giddiness  Rationale for Evaluation and Treatment Rehabilitation  SUBJECTIVE:   SUBJECTIVE STATEMENT: Reports he had COVID, two weeks later had stomach cramps and lightheaded. About a month ago had an Upper Respiratory Infection. Reports that he had dizziness with laying down, rolling in bed, and quick movements. Seems like things are worse on the R side. Was not able to see any nystagmus. Patient reports some spinning. No falls.  Pt accompanied by: self  PERTINENT HISTORY: Allergy, Hx of throat Cancer, Carotid Artery Occlusion, CAD, HLD, HTN, Adenoma of Colon  PAIN:  Are you having pain? No  PRECAUTIONS: None  WEIGHT BEARING RESTRICTIONS No  FALLS: Has patient fallen in last 6 months? No  LIVING ENVIRONMENT: Lives with: lives with their family Lives in: House/apartment Has following equipment at home: None  PLOF: Independent  PATIENT GOALS Resolve Dizziness  OBJECTIVE:   COGNITION: Overall cognitive status: Within functional limits for tasks assessed   SENSATION: WFL  POSTURE: No Significant postural  limitations   Cervical ROM:  WNL   STRENGTH: WFL   TRANSFERS: Assistive device utilized: None  Sit to stand: Complete Independence Stand to sit: Complete Independence  GAIT: Gait pattern: WFL Distance walked: clinic distance Assistive device utilized: None Level of assistance: Complete Independence Comments: no unsteadiness noted  PATIENT SURVEYS:  Did not capture on Eval   VESTIBULAR ASSESSMENT    SYMPTOM BEHAVIOR:   Subjective history: See Subjective   Non-Vestibular symptoms:  None ; Did have tinnitus initially but it went away.    Type of dizziness: Spinning/Vertigo and Unsteady with head/body turns   Frequency: Daily   Duration: Seconds   Aggravating factors: Induced by position change: lying supine, rolling to the right, and supine to sit and Induced by motion: looking up at the ceiling, turning body quickly, and turning head quickly   Relieving factors: slow movements   Progression of symptoms: unchanged   OCULOMOTOR EXAM:   Ocular Alignment: normal   Ocular ROM: No Limitations   Spontaneous Nystagmus: absent   Gaze-Induced Nystagmus: absent   Smooth Pursuits: intact   Saccades: intact      VESTIBULAR - OCULAR REFLEX:    Slow VOR: Normal   VOR Cancellation: Normal   Head-Impulse Test: HIT Right: negative HIT Left: negative; reports some mild dizziness afterwards (latency effect noted)    POSITIONAL TESTING: Right Dix-Hallpike: upbeating, right nystagmus and Duration: 10 seconds Left Dix-Hallpike: no nystagmus Right Roll Test: no nystagmus Left Roll Test: no nystagmus    MOTION SENSITIVITY:    Motion Sensitivity Quotient  Intensity: 0 = none, 1 = Lightheaded, 2 = Mild, 3 = Moderate, 4 = Severe, 5 = Vomiting  Intensity  1. Sitting to supine 0  2. Supine to L side 0  3. Supine to R side 1  4. Supine to sitting 1  5. L Hallpike-Dix 0  6. Up from L  1  7. R Hallpike-Dix 2  8. Up from R  1  9. Sitting, head  tipped to L knee   10. Head up  from L  knee   11. Sitting, head  tipped to R knee   12. Head up from R  knee   13. Sitting head turns x5   14.Sitting head nods x5   15. In stance, 180  turn to L    16. In stance, 180  turn to R       VESTIBULAR TREATMENT:  Canalith Repositioning:   Epley Right: Number of Reps: 2, Response to Treatment: symptoms resolved, and Comment: no symptoms upon reassessment  PATIENT EDUCATION: Education details: Educated on BPPV; POC/Eval Findings Person educated: Patient Education method: Explanation Education comprehension: verbalized understanding   GOALS: Goals reviewed with patient? Yes  SHORT TERM GOALS = LONG TERM GOALS   LONG TERM GOALS: Target date: 02/24/2022    Patient will demonstrate (-) positional testing to indicate resolution of BPPV Baseline: R BPPV Goal status: INITIAL  2.  Pt will verbalize understanding of self treatment for BPPV in case of reoccurrence Baseline: dependent Goal status: INITIAL   ASSESSMENT:  CLINICAL IMPRESSION: Patient is a 76  y.o. male referred to Neuro OPPT services for Dizziness/BPPV. Patient's PMH significant for the following: Allergy, Hx of throat Cancer, Carotid Artery Occlusion, CAD, HLD, HTN, Adenoma of Colon . Upon evaluation, patient presents with the following impairments:Dizziness and R Upbeating Nystagmus of Short Duration Indicating R Posterior Canal Canalithiasis. Completed CRM x 2 reps with improvements in symptoms noted. Patient will benefit from skilled PT services to address impairments.     OBJECTIVE IMPAIRMENTS dizziness.   ACTIVITY LIMITATIONS lifting and reach over head  PARTICIPATION LIMITATIONS: community activity  PERSONAL FACTORS Age are also affecting patient's functional outcome.   REHAB POTENTIAL: Excellent  CLINICAL DECISION MAKING: Stable/uncomplicated  EVALUATION COMPLEXITY: Low   PLAN: PT FREQUENCY: 1x/week  PT DURATION: 4 weeks  PLANNED INTERVENTIONS: Therapeutic exercises,  Therapeutic activity, Neuromuscular re-education, Balance training, Gait training, Patient/Family education, Joint manipulation, Joint mobilization, Stair training, Vestibular training, Canalith repositioning, and Manual therapy  PLAN FOR NEXT SESSION: Reassess R BPPV and treat as indicated.    Tempie Donning, PT, DPT 01/24/2022, 3:39 PM

## 2022-01-25 DIAGNOSIS — D2272 Melanocytic nevi of left lower limb, including hip: Secondary | ICD-10-CM | POA: Diagnosis not present

## 2022-01-25 DIAGNOSIS — D692 Other nonthrombocytopenic purpura: Secondary | ICD-10-CM | POA: Diagnosis not present

## 2022-01-25 DIAGNOSIS — D2261 Melanocytic nevi of right upper limb, including shoulder: Secondary | ICD-10-CM | POA: Diagnosis not present

## 2022-01-25 DIAGNOSIS — Z85828 Personal history of other malignant neoplasm of skin: Secondary | ICD-10-CM | POA: Diagnosis not present

## 2022-01-25 DIAGNOSIS — L814 Other melanin hyperpigmentation: Secondary | ICD-10-CM | POA: Diagnosis not present

## 2022-01-25 DIAGNOSIS — L821 Other seborrheic keratosis: Secondary | ICD-10-CM | POA: Diagnosis not present

## 2022-01-25 DIAGNOSIS — D225 Melanocytic nevi of trunk: Secondary | ICD-10-CM | POA: Diagnosis not present

## 2022-01-26 ENCOUNTER — Ambulatory Visit: Payer: Medicare Other | Admitting: Interventional Cardiology

## 2022-01-26 ENCOUNTER — Encounter: Payer: Self-pay | Admitting: Interventional Cardiology

## 2022-01-26 VITALS — BP 118/60 | HR 49 | Ht 73.0 in | Wt 203.8 lb

## 2022-01-26 DIAGNOSIS — I6521 Occlusion and stenosis of right carotid artery: Secondary | ICD-10-CM | POA: Diagnosis not present

## 2022-01-26 DIAGNOSIS — I10 Essential (primary) hypertension: Secondary | ICD-10-CM | POA: Diagnosis not present

## 2022-01-26 DIAGNOSIS — Z87891 Personal history of nicotine dependence: Secondary | ICD-10-CM | POA: Diagnosis not present

## 2022-01-26 DIAGNOSIS — I25119 Atherosclerotic heart disease of native coronary artery with unspecified angina pectoris: Secondary | ICD-10-CM | POA: Diagnosis not present

## 2022-01-26 DIAGNOSIS — E7800 Pure hypercholesterolemia, unspecified: Secondary | ICD-10-CM | POA: Diagnosis not present

## 2022-01-26 NOTE — Patient Instructions (Signed)

## 2022-02-01 ENCOUNTER — Ambulatory Visit: Payer: Medicare Other | Attending: Family Medicine

## 2022-02-01 ENCOUNTER — Ambulatory Visit: Payer: Medicare Other

## 2022-02-01 DIAGNOSIS — H8111 Benign paroxysmal vertigo, right ear: Secondary | ICD-10-CM | POA: Diagnosis not present

## 2022-02-01 DIAGNOSIS — R42 Dizziness and giddiness: Secondary | ICD-10-CM | POA: Diagnosis not present

## 2022-02-01 NOTE — Patient Instructions (Signed)
Sit to Side-Lying    Sit on edge of bed. 1. Turn head 45 to right. 2. Maintain head position and lie down slowly on left side. Hold until symptoms subside. 3. Sit up slowly. Hold until symptoms subside. 4. Turn head 45 to left. 5. Maintain head position and lie down slowly on right side. Hold until symptoms subside. 6. Sit up slowly. Repeat sequence 5 times per session. Do 2 sessions per day.

## 2022-02-01 NOTE — Therapy (Signed)
OUTPATIENT PHYSICAL THERAPY VESTIBULAR TREATMENT NOTE   Patient Name: Shane Shelton MRN: [id number] DOB:October 14, 1945, 76 y.o., male Today's Date: 02/01/2022  PCP: [name], MD REFERRING PROVIDER: [name], MD   PT End of Session - 02/01/22 0752     Visit Number 2    Number of Visits 5    Date for PT Re-Evaluation 02/24/22    Authorization Type UHC Medicare (10th Visit PN)    PT Start Time 2130    PT Stop Time 0818    PT Time Calculation (min) 21 min    Activity Tolerance Patient tolerated treatment well    Behavior During Therapy [facility] for tasks assessed/performed             Past Medical History:  Diagnosis Date   Allergy    hayfever   Cancer ([place])    Throat cancer 2011   Carotid artery occlusion    Coronary artery disease    Hyperlipidemia    Hypertension    Tubular adenoma of colon 11/25/2013   Past Surgical History:  Procedure Laterality Date   CARDIAC CATHETERIZATION  05/08/2017   S/P S/P PCI, DES of Mid RCA at [facility] in [place]     multiple   ESOPHAGOGASTRODUODENOSCOPY     multiple   [place]   INTRAVASCULAR PRESSURE WIRE/FFR STUDY N/A 10/09/2019   Procedure: INTRAVASCULAR PRESSURE WIRE/FFR STUDY;  Surgeon: Belva Crome, MD;  Location: [place] [facility];  Service: Cardiovascular;  Laterality: N/A;   LEFT HEART CATH AND CORONARY ANGIOGRAPHY N/A 10/09/2019   Procedure: LEFT HEART CATH AND CORONARY ANGIOGRAPHY;  Surgeon: Belva Crome, MD;  Location: [place] CV [facility];  Service: Cardiovascular;  Laterality: N/A;   Patient Active Problem List   Diagnosis Date Noted   BPPV (benign paroxysmal positional vertigo) 01/17/2022   Weight loss 12/23/2021   Abdominal pain 12/23/2021   Long COVID 12/23/2021   DM type 2 causing vascular disease ([place]) 04/25/2021   Achilles tendinitis 03/30/2020   Preventative health care 03/28/2019   Former smoker  03/28/2019   Essential hypertension 05/17/2017   Premature ventricular contractions 02/14/2017   Left ventricular hypertrophy 02/14/2017   Hx of adenomatous polyp of colon 02/12/2017   Pulmonary nodules/lesions, multiple 02/12/2017   Neuropathy 12/14/2016   Paresthesia of both legs 12/14/2016   History of skin cancer 12/14/2016   Hypercholesterolemia 10/05/2016   Coronary artery disease involving native coronary artery of native heart with angina pectoris ([place]) 10/05/2016   History of oral cancer 10/05/2016   GERD (gastroesophageal reflux disease) 10/05/2016   Lumbar back pain with radiculopathy affecting left lower extremity 10/05/2016    ONSET DATE: 01/23/22  REFERRING DIAG: H81.10 (ICD-10-CM) - Benign paroxysmal positional vertigo, unspecified laterality  THERAPY DIAG:  BPPV (benign paroxysmal positional vertigo), right  Dizziness and giddiness  Rationale for Evaluation and Treatment Rehabilitation  PERTINENT HISTORY: Allergy, Hx of throat Cancer, Carotid Artery Occlusion, CAD, HLD, HTN, Adenoma of Colon  PRECAUTIONS: None  SUBJECTIVE: Patient reports that the dizziness is a little better, but still present. Had it this morning. Reports feels like the intensity has decreased, and not happening as frequent.   PAIN:  Are you having pain? No   OBJECTIVE:   POSITIONAL TESTS:  Right Dix-Hallpike: no nystagmus Left Dix-Hallpike: no nystagmus Right Sidelying: no nystagmus Left Sidelying: no nystagmus   VESTIBULAR TREATMENT:  Canalith Repositioning: Comment: No treatment warranted today; PT educating on  proper completion of R Epley for self management/treatment in case of reoccurrence. Handout provided with verbal and demonstration provided.   Habituation: Brandt-Daroff: number of reps: x 2 reps; educated on proper completion and provided handout. See below.    Sit to Side-Lying    Sit on edge of bed. 1. Turn head 45 to right. 2. Maintain head position and lie  down slowly on left side. Hold until symptoms subside. 3. Sit up slowly. Hold until symptoms subside. 4. Turn head 45 to left. 5. Maintain head position and lie down slowly on right side. Hold until symptoms subside. 6. Sit up slowly. Repeat sequence 5 times per session. Do 2 sessions per day.  PATIENT EDUCATION: Education details: Habituation HEP Person educated: Patient Education method: Explanation Education comprehension: verbalized understanding     GOALS: Goals reviewed with patient? Yes   SHORT TERM GOALS = LONG TERM GOALS     LONG TERM GOALS: Target date: 02/24/2022     Patient will demonstrate (-) positional testing to indicate resolution of BPPV Baseline: R BPPV Goal status: INITIAL   2.  Pt will verbalize understanding of self treatment for BPPV in case of reoccurrence Baseline: dependent Goal status: INITIAL     ASSESSMENT:   CLINICAL IMPRESSION: Completed reassessment of positional testing with patient demonstrating negative positional testing bilaterally indicating resolution of R Posterior Canal Canalithiasis. Educated on self treatment and Epley in case of reoccurrence and established habituation HEP to address residual dizziness. Will plan to keep chart open for 4 weeks in case of reoccurrence. Will continue per POC.        OBJECTIVE IMPAIRMENTS dizziness.    ACTIVITY LIMITATIONS lifting and reach over head   PARTICIPATION LIMITATIONS: community activity   PERSONAL FACTORS Age are also affecting patient's functional outcome.    REHAB POTENTIAL: Excellent   CLINICAL DECISION MAKING: Stable/uncomplicated   EVALUATION COMPLEXITY: Low     PLAN: PT FREQUENCY: 1x/week   PT DURATION: 4 weeks   PLANNED INTERVENTIONS: Therapeutic exercises, Therapeutic activity, Neuromuscular re-education, Balance training, Gait training, Patient/Family education, Joint manipulation, Joint mobilization, Stair training, Vestibular training, Canalith repositioning, and  Manual therapy   PLAN FOR NEXT SESSION: Reassess R BPPV and treat as indicated.    Jones Bales, PT, DPT 02/01/2022, 8:22 AM

## 2022-02-02 ENCOUNTER — Other Ambulatory Visit: Payer: Self-pay | Admitting: Family Medicine

## 2022-02-25 ENCOUNTER — Encounter: Payer: Self-pay | Admitting: Interventional Cardiology

## 2022-04-12 ENCOUNTER — Other Ambulatory Visit: Payer: Self-pay | Admitting: Interventional Cardiology

## 2022-05-15 ENCOUNTER — Ambulatory Visit (INDEPENDENT_AMBULATORY_CARE_PROVIDER_SITE_OTHER): Payer: Medicare Other | Admitting: Family Medicine

## 2022-05-15 ENCOUNTER — Encounter: Payer: Self-pay | Admitting: Family Medicine

## 2022-05-15 VITALS — BP 129/70 | HR 45 | Temp 97.5°F | Ht 73.0 in | Wt 206.4 lb

## 2022-05-15 DIAGNOSIS — I10 Essential (primary) hypertension: Secondary | ICD-10-CM | POA: Diagnosis not present

## 2022-05-15 DIAGNOSIS — I25119 Atherosclerotic heart disease of native coronary artery with unspecified angina pectoris: Secondary | ICD-10-CM

## 2022-05-15 DIAGNOSIS — Z23 Encounter for immunization: Secondary | ICD-10-CM | POA: Diagnosis not present

## 2022-05-15 DIAGNOSIS — Z0000 Encounter for general adult medical examination without abnormal findings: Secondary | ICD-10-CM | POA: Diagnosis not present

## 2022-05-15 DIAGNOSIS — R7303 Prediabetes: Secondary | ICD-10-CM | POA: Diagnosis not present

## 2022-05-15 DIAGNOSIS — R634 Abnormal weight loss: Secondary | ICD-10-CM

## 2022-05-15 DIAGNOSIS — Z8639 Personal history of other endocrine, nutritional and metabolic disease: Secondary | ICD-10-CM | POA: Diagnosis not present

## 2022-05-15 DIAGNOSIS — Z87891 Personal history of nicotine dependence: Secondary | ICD-10-CM

## 2022-05-15 LAB — POCT GLYCOSYLATED HEMOGLOBIN (HGB A1C): HbA1c, POC (prediabetic range): 6 % (ref 5.7–6.4)

## 2022-05-15 NOTE — Patient Instructions (Addendum)
Flu shot today. We will have covid booster soon Due for a tetanus booster - next time you have a cut or scrape. I recommend shingles vaccine. If you get a vaccine elsewhere, let us know so that I can update your record.   I will call with lab work.

## 2022-05-16 ENCOUNTER — Encounter: Payer: Self-pay | Admitting: Family Medicine

## 2022-05-16 LAB — BASIC METABOLIC PANEL
BUN/Creatinine Ratio: 16 (ref 10–24)
BUN: 17 mg/dL (ref 8–27)
CO2: 26 mmol/L (ref 20–29)
Calcium: 9.5 mg/dL (ref 8.6–10.2)
Chloride: 101 mmol/L (ref 96–106)
Creatinine, Ser: 1.08 mg/dL (ref 0.76–1.27)
Glucose: 109 mg/dL — ABNORMAL HIGH (ref 70–99)
Potassium: 4.3 mmol/L (ref 3.5–5.2)
Sodium: 139 mmol/L (ref 134–144)
eGFR: 72 mL/min/{1.73_m2} (ref >59)

## 2022-05-16 LAB — LIPID PANEL
Chol/HDL Ratio: 1.6 ratio (ref 0.0–5.0)
Cholesterol, Total: 90 mg/dL — ABNORMAL LOW (ref 100–199)
HDL: 58 mg/dL (ref >39)
LDL Chol Calc (NIH): 18 mg/dL (ref 0–99)
Triglycerides: 59 mg/dL (ref 0–149)
VLDL Cholesterol Cal: 14 mg/dL (ref 5–40)

## 2022-05-16 LAB — MICROALBUMIN / CREATININE URINE RATIO
Creatinine, Urine: 92.5 mg/dL
Microalb/Creat Ratio: <3 mg/g creat (ref 0–29)
Microalbumin, Urine: <3 ug/mL

## 2022-05-16 NOTE — Assessment & Plan Note (Addendum)
Symptoms well controled on current meds.  Continue follow up with cards.    Had message communication with cardiologist, Dr. Tamala Julian, who felt wise to stop metoprolol given heart rate of 45.  Called and informed patient.  He is aware monitor BP and make [place] immediately aware of any symptom of CP.  Given low dose, will stop and not attempt to wean.

## 2022-05-16 NOTE — Assessment & Plan Note (Signed)
Continue annual lung cancer screening. 

## 2022-05-16 NOTE — Assessment & Plan Note (Signed)
Given reassuring labs, his small weight loss is not a current concern unless it continues.

## 2022-05-16 NOTE — Assessment & Plan Note (Signed)
Healthy male with no current bad habits working hard to control his known chronic problems.

## 2022-05-16 NOTE — Progress Notes (Signed)
    SUBJECTIVE:   CHIEF COMPLAINT / HPI:   Annual exam: Acute concerns: none.  Patient is in a great mood.  Just returned from [place] vacation.  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 Chronic medical problems Known CAD.  No concerning chest pain.  He gets occasional twinge that does not concern him or his cardiologist. HBP.  Well controled on current meds.  No chest pain or DOE.  I note he is on low dose metoprolol and has bradycardia.  Denies any syncope, presyncope or lightheaded spells.   Hx of oral cancer.  Quit smoking and all tobacco products.  Not seeing ENT.  Sees dentist, who has no concerns.   Tobacco history puts him at risk for lung cancer.  He is plugged into annual lung cancer screening.   His problem list is in error.  He is not diabetic.  A1C previously and today in prediabetes range. HPDP: Will get flu shot today.  Knows he is due for covid boost (we don't have yet) and shingles vaccine.    PERTINENT  PMH / PSH: Denies worsening headache, SOB, abonormal lumps or worrisome skin changes, bleeding, swelling of ankles, weakness or numbness.  Denies change in vision, bowel, bladder or appetite.  He has lost a little more weight than he would expect from his exercise program.  OBJECTIVE:   BP 129/70   Pulse (!) 45   Temp (!) 97.5 F (36.4 C) (Oral)   Ht '6\' 1"'$  (1.854 m)   Wt 206 lb 6.4 oz (93.6 kg)   SpO2 97%   BMI 27.23 kg/m   Thorough oral exam normal, no leukoplakia Neck without nodes. Lungs clear Cardiac RRR without m or g Abd benign Ext no edema Neuro, motor, sensory, gait, cognition and affect all grossly normal.  ASSESSMENT/PLAN:   Preventative health care Healthy male with no current bad habits working hard to control his known chronic problems.    Prediabetes Continue diet and exercise  Weight loss Given reassuring labs, his small weight loss is not a current concern unless it continues.  Essential  hypertension Well controled on current meds.  Former smoker Continue annual lung cancer screening.  Coronary artery disease involving native coronary artery of native heart with angina pectoris ([place]) Symptoms well controled on current meds.  Continue follow up with cards.       [name], MD [place]

## 2022-05-16 NOTE — Assessment & Plan Note (Signed)
Continue diet and exercise. 

## 2022-05-16 NOTE — Assessment & Plan Note (Signed)
Well controled on current meds 

## 2022-05-17 NOTE — Addendum Note (Signed)
Addended by: [name] on: 05/17/2022 05:01 PM   Modules accepted: Orders

## 2022-05-25 ENCOUNTER — Other Ambulatory Visit: Payer: Self-pay | Admitting: Interventional Cardiology

## 2022-06-06 ENCOUNTER — Other Ambulatory Visit: Payer: Self-pay | Admitting: Interventional Cardiology

## 2022-06-28 ENCOUNTER — Ambulatory Visit (HOSPITAL_COMMUNITY)
Admission: RE | Admit: 2022-06-28 | Discharge: 2022-06-28 | Disposition: A | Discharge status: Home / Self Care | Payer: Medicare Other | Source: Ambulatory Visit | Attending: Interventional Cardiology | Admitting: Interventional Cardiology

## 2022-06-28 DIAGNOSIS — I6523 Occlusion and stenosis of bilateral carotid arteries: Secondary | ICD-10-CM | POA: Diagnosis not present

## 2022-07-03 DIAGNOSIS — H2513 Age-related nuclear cataract, bilateral: Secondary | ICD-10-CM | POA: Diagnosis not present

## 2022-07-03 DIAGNOSIS — H31092 Other chorioretinal scars, left eye: Secondary | ICD-10-CM | POA: Diagnosis not present

## 2022-07-03 DIAGNOSIS — H43391 Other vitreous opacities, right eye: Secondary | ICD-10-CM | POA: Diagnosis not present

## 2022-07-03 DIAGNOSIS — H11441 Conjunctival cysts, right eye: Secondary | ICD-10-CM | POA: Diagnosis not present

## 2022-07-07 ENCOUNTER — Encounter: Payer: Self-pay | Admitting: Family Medicine

## 2022-07-07 ENCOUNTER — Encounter: Payer: Self-pay | Admitting: Interventional Cardiology

## 2022-07-29 ENCOUNTER — Other Ambulatory Visit: Payer: Self-pay | Admitting: Interventional Cardiology

## 2022-08-09 ENCOUNTER — Other Ambulatory Visit (HOSPITAL_COMMUNITY): Payer: Self-pay

## 2022-09-05 ENCOUNTER — Encounter: Payer: Self-pay | Admitting: Family Medicine

## 2022-09-22 ENCOUNTER — Ambulatory Visit (INDEPENDENT_AMBULATORY_CARE_PROVIDER_SITE_OTHER): Payer: Medicare Other

## 2022-09-22 DIAGNOSIS — Z0000 Encounter for general adult medical examination without abnormal findings: Secondary | ICD-10-CM

## 2022-09-22 NOTE — Progress Notes (Signed)
I connected with  Shane Shelton on 09/22/22 by a audio enabled telemedicine application and verified that I am speaking with the correct person using two identifiers.  Patient Location: Home  Provider Location: Home Office  I discussed the limitations of evaluation and management by telemedicine. The patient expressed understanding and agreed to proceed.   Subjective:   Shane Shelton is a 77 y.o. male who presents for an Initial Medicare Annual Wellness Visit.  Review of Systems    Per HPI unless specifically indicated below.  Cardiac Risk Factors include: advanced age (>23mn, >>40women);male gender, Essential Hypertension, CAD, and Hypercholesteremia.           Objective:       05/15/2022    8:44 AM 01/26/2022    8:40 AM 12/22/2021    9:22 AM  Vitals with BMI  Height '6\' 1"'$  '6\' 1"'$  '6\' 1"'$   Weight 206 lbs 6 oz 203 lbs 13 oz 203 lbs 13 oz  BMI 27.24 [id number] Systolic [id number] Diastolic 70 60 60  Pulse 45 49 47    There were no vitals filed for this visit. There is no height or weight on file to calculate BMI.     09/22/2022    1:59 PM 05/15/2022    8:46 AM 04/21/2021    8:54 AM 08/20/2020    3:21 PM 10/09/2019    6:19 AM 03/27/2019    8:53 AM 04/22/2018   10:26 AM  Advanced Directives  Does Patient Have a Medical Advance Directive? Yes No No Yes Yes No No  Type of AParamedicof [place]aguna Woods[place] will   [place] will Living will;Healthcare Power of Attorney    Does patient want to make changes to medical advance directive? No - Patient declined   No - Patient declined No - Patient declined    Copy of HAthelstanin Chart? Yes - validated most recent copy scanned in chart (See row information)    No - copy requested    Would patient like information on creating a medical advance directive?   No - Patient declined   No - Patient declined No - Patient declined    Current Medications  (verified) Outpatient Encounter Medications as of 09/22/2022  Medication Sig   acetaminophen (TYLENOL) 500 MG tablet Take 1,000 mg by mouth every 6 (six) hours as needed (for pain.).   aspirin EC 81 MG tablet Take 81 mg by mouth every evening.    clopidogrel (PLAVIX) 75 MG tablet TAKE 1 TABLET BY MOUTH DAILY   Coenzyme Q-10 200 MG CAPS Take 200 mg by mouth every evening.    isosorbide mononitrate (IMDUR) 60 MG 24 hr tablet TAKE 1 TABLET BY MOUTH IN THE  EVENING   losartan (COZAAR) 25 MG tablet Take 1 tablet (25 mg total) by mouth daily. Appointment needed for future refills. Thank you.   naproxen sodium (ALEVE) 220 MG tablet Take 220 mg by mouth 2 (two) times daily as needed (pain.).   pantoprazole (PROTONIX) 40 MG tablet TAKE 1 TABLET BY MOUTH  DAILY AS NEEDED   REPATHA SURECLICK [id number]/ML SOAJ INJECT '140MG'$  SUBCUTANEOUSLY EVERY 2 WEEKS   rosuvastatin (CRESTOR) 10 MG tablet TAKE 1 TABLET BY MOUTH DAILY   nitroGLYCERIN (NITROSTAT) 0.4 MG SL tablet Place 1 tablet (0.4 mg total) under the tongue every 5 (five) minutes as needed for chest pain.   No facility-administered encounter medications on file as  of 09/22/2022.    Allergies (verified) Ambien [zolpidem], Gabapentin, Beta adrenergic blockers, and Statins   History: Past Medical History:  Diagnosis Date   Allergy    hayfever   Cancer ([place])    Throat cancer 2011   Carotid artery occlusion    Coronary artery disease    Hyperlipidemia    Hypertension    Tubular adenoma of colon 11/25/2013   Past Surgical History:  Procedure Laterality Date   CARDIAC CATHETERIZATION  05/08/2017   S/P S/P PCI, DES of Mid RCA at [facility] in [place]     multiple   ESOPHAGOGASTRODUODENOSCOPY     multiple   ESOPHAGOSCOPY W/ PERCUTANEOUS GASTROSTOMY TUBE PLACEMENT  2011   INTRAVASCULAR PRESSURE WIRE/FFR STUDY N/A 10/09/2019   Procedure: INTRAVASCULAR PRESSURE WIRE/FFR STUDY;  Surgeon: Belva Crome, MD;  Location: [facility];  Service: Cardiovascular;  Laterality: N/A;   LEFT HEART CATH AND CORONARY ANGIOGRAPHY N/A 10/09/2019   Procedure: LEFT HEART CATH AND CORONARY ANGIOGRAPHY;  Surgeon: Belva Crome, MD;  Location: [facility];  Service: Cardiovascular;  Laterality: N/A;   Family History  Problem Relation Age of Onset   Heart disease Mother    Diabetes Mother    Hyperlipidemia Mother    Cancer Father    Heart disease Brother    Cancer Brother    Hyperlipidemia Brother    Colon cancer Neg Hx    Colon polyps Neg Hx    Esophageal cancer Neg Hx    Stomach cancer Neg Hx    Rectal cancer Neg Hx    Social History   Socioeconomic History   Marital status: Married    Spouse name: Verdis Frederickson   Number of children: Not on file   Years of education: Not on file   Highest education level: Doctorate  Occupational History   Occupation: Adult nurse    Comment: Retired   Occupation: Retired  Tobacco Use   Smoking status: Former    Packs/day: 1.00    Years: 40.00    Total pack years: 40.00    Types: Cigarettes    Quit date: 10/05/2009    Years since quitting: 12.9   Smokeless tobacco: Never  Vaping Use   Vaping Use: Never used  Substance and Sexual Activity   Alcohol use: Yes    Alcohol/week: 7.0 standard drinks of alcohol    Types: 7 Glasses of wine per week   Drug use: Never   Sexual activity: Yes    Comment: monagamous  Other Topics Concern   Not on file  Social History Narrative   Patient is married with 4 daughters   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   1 alcoholic beverage a day 1 caffeinated beverage daily former smoker no tobacco or drug use now   Social Determinants of Radio broadcast assistant Strain: Low Risk  (09/22/2022)   Overall Financial Resource Strain (CARDIA)    Difficulty of Paying Living Expenses: Not hard at all  Food Insecurity: No Food  Insecurity (09/22/2022)   Hunger Vital Sign    Worried About Running Out of Food in the Last Year: Never true    [place] in the Last Year: Never true  Transportation Needs: No Transportation Needs (09/22/2022)   PRAPARE - Hydrologist (Medical): No  Lack of Transportation (Non-Medical): No  Physical Activity: Sufficiently Active (09/22/2022)   Exercise Vital Sign    Days of Exercise per Week: 5 days    Minutes of Exercise per Session: 90 min  Stress: No Stress Concern Present (09/22/2022)   [place]    Feeling of Stress : Not at all  Social Connections: Moderately Integrated (09/22/2022)   Social Connection and Isolation Panel [NHANES]    Frequency of Communication with Friends and Family: More than three times a week    Frequency of Social Gatherings with Friends and Family: Three times a week    Attends Religious Services: Never    Active Member of Clubs or Organizations: No    Attends Music therapist: More than 4 times per year    Marital Status: Married    Tobacco Counseling Counseling given: No   Clinical Intake:  Pre-visit preparation completed: No  Pain : No/denies pain     Nutritional Status: BMI of 19-24  Normal Nutritional Risks: None Diabetes: No  How often do you need to have someone help you when you read instructions, pamphlets, or other written materials from your doctor or pharmacy?: 1 - Never  Diabetic? No  Interpreter Needed?: No  Information entered by :: Donnie Mesa, CMA   Activities of Daily Living    09/22/2022    1:34 PM  In your present state of health, do you have any difficulty performing the following activities:  Hearing? 0  Vision? 0  Difficulty concentrating or making decisions? 1  Walking or climbing stairs? 0  Dressing or bathing? 0  Doing errands, shopping? 0    Patient Care Team: Kinnie Feil, MD as  PCP - General (Family Medicine) Belva Crome, MD (Inactive) as PCP - Cardiology (Cardiology)  Indicate any recent Medical Services you may have received from other than [facility] providers in the past year (date may be approximate).     Assessment:   This is a routine wellness examination for Beattie.  Hearing/Vision screen Denies any hearing issues. Denies any vision changes. Annual Eye Exam  Dietary issues and exercise activities discussed:     Goals Addressed   None    Depression Screen    09/22/2022    1:34 PM 05/15/2022    8:54 AM 12/22/2021    9:22 AM 04/21/2021    8:54 AM 04/12/2020   10:21 AM 03/27/2019    8:53 AM 04/22/2018   10:26 AM  PHQ 2/9 Scores  PHQ - 2 Score 0 0 0 0 0 0 0  PHQ- 9 Score  0 0 0       Fall Risk    09/22/2022    1:35 PM 03/27/2019    8:53 AM 06/17/2018    9:33 AM 06/05/2017    8:31 AM 04/04/2017    1:54 PM  Fall Risk   Falls in the past year? 0 0 0 No No  Comment   Emmi Telephone Survey: data to providers prior to load    Number falls in past yr: 0      Injury with Fall? 0      Risk for fall due to : No Fall Risks      Follow up Falls evaluation completed        [place]:  Any stairs in or around the home? Yes  If so, are there any without handrails? No  Home free  of loose throw rugs in walkways, pet beds, electrical cords, etc? Yes  Adequate lighting in your home to reduce risk of falls? Yes   ASSISTIVE DEVICES UTILIZED TO PREVENT FALLS:  Life alert? No  Use of a cane, walker or w/c? No  Grab bars in the bathroom? No  Shower chair or bench in shower? No  Elevated toilet seat or a handicapped toilet? Yes   TIMED UP AND GO:  Was the test performed?Unable to perform, virtual appointment   Cognitive Function:        09/22/2022    1:35 PM  6CIT Screen  What Year? 0 points  What month? 0 points  What time? 0 points  Count back from 20 0 points  Months in reverse 0 points  Repeat phrase 0  points  Total Score 0 points    Immunizations Immunization History  Administered Date(s) Administered   Fluad Quad(high Dose 65+) 04/12/2020, 04/21/2021, 05/15/2022   Influenza, Seasonal, Injecte, Preservative Fre 08/07/2011, 07/04/2012   Influenza,inj,Quad PF,6+ Mos 04/04/2017, 04/22/2018, 03/27/2019   Moderna SARS-COV2 Booster Vaccination 05/27/2020   Moderna Sars-Covid-2 Vaccination 08/22/2019, 09/26/2019   Pneumococcal Conjugate-13 07/08/2014   Pneumococcal Polysaccharide-23 05/16/2016   Tdap 10/17/2011   Unspecified SARS-COV-2 Vaccination 07/04/2022    TDAP status: Due, Education has been provided regarding the importance of this vaccine. Advised may receive this vaccine at local pharmacy or Health Dept. Aware to provide a copy of the vaccination record if obtained from local pharmacy or Health Dept. Verbalized acceptance and understanding.  Flu Vaccine status: Up to date  Pneumococcal vaccine status: Up to date  Covid-19 vaccine status: Information provided on how to obtain vaccines.   Qualifies for Shingles Vaccine? Yes   Zostavax completed No   Shingrix Completed?: No.    Education has been provided regarding the importance of this vaccine. Patient has been advised to call insurance company to determine out of pocket expense if they have not yet received this vaccine. Advised may also receive vaccine at local pharmacy or Health Dept. Verbalized acceptance and understanding.  Screening Tests Health Maintenance  Topic Date Due   FOOT EXAM  Never done   Zoster Vaccines- Shingrix (1 of 2) Never done   DTaP/Tdap/Td (2 - Td or Tdap) 10/16/2021   COVID-19 Vaccine (4 - 2023-24 season) 08/29/2022   HEMOGLOBIN A1C  11/14/2022   Lung Cancer Screening  01/18/2023   Diabetic kidney evaluation - eGFR measurement  05/16/2023   Diabetic kidney evaluation - Urine ACR  05/16/2023   OPHTHALMOLOGY EXAM  07/08/2023   Medicare Annual Wellness (AWV)  09/23/2023   COLONOSCOPY (Pts 45-43yr  Insurance coverage will need to be confirmed)  09/15/2026   Pneumonia Vaccine 77 Years old  Completed   INFLUENZA VACCINE  Completed   Hepatitis C Screening  Completed   HPV VACCINES  Aged Out    Health Maintenance  Health Maintenance Due  Topic Date Due   FOOT EXAM  Never done   Zoster Vaccines- Shingrix (1 of 2) Never done   DTaP/Tdap/Td (2 - Td or Tdap) 10/16/2021   COVID-19 Vaccine (4 - 2023-24 season) 08/29/2022    Colorectal cancer screening: Type of screening: Colonoscopy. Completed 09/15/2021. Repeat every 5 years  Lung Cancer Screening: (Low Dose CT Chest recommended if Age 76-80years, 30 pack-year currently smoking OR have quit w/in 15years.) does qualify.   Lung Cancer Screening Referral: No, due 01/18/2023  Additional Screening:  Hepatitis C Screening: does qualify; Completed 04/14/2017  Vision Screening: Recommended  annual ophthalmology exams for early detection of glaucoma and other disorders of the eye. Is the patient up to date with their annual eye exam?  Yes  Who is the provider or what is the name of the office in which the patient attends annual eye exams? [facility]  If pt is not established with a provider, would they like to be referred to a provider to establish care? No .   Dental Screening: Recommended annual dental exams for proper oral hygiene  Community Resource Referral / Chronic Care Management: CRR required this visit?  No   CCM required this visit?  No      Plan:     I have personally reviewed and noted the following in the patient's chart:   Medical and social history Use of alcohol, tobacco or illicit drugs  Current medications and supplements including opioid prescriptions. Patient is not currently taking opioid prescriptions. Functional ability and status Nutritional status Physical activity Advanced directives List of other physicians Hospitalizations, surgeries, and ER visits in previous 12  months Vitals Screenings to include cognitive, depression, and falls Referrals and appointments  In addition, I have reviewed and discussed with patient certain preventive protocols, quality metrics, and best practice recommendations. A written personalized care plan for preventive services as well as general preventive health recommendations were provided to patient.    Mr. Ferrett , Thank you for taking time to come for your Medicare Wellness Visit. I appreciate your ongoing commitment to your health goals. Please review the following plan we discussed and let me know if I can assist you in the future.   These are the goals we discussed:  Goals   None     This is a list of the screening recommended for you and due dates:  Health Maintenance  Topic Date Due   Complete foot exam   Never done   Zoster (Shingles) Vaccine (1 of 2) Never done   DTaP/Tdap/Td vaccine (2 - Td or Tdap) 10/16/2021   COVID-19 Vaccine (4 - 2023-24 season) 08/29/2022   Hemoglobin A1C  11/14/2022   Screening for Lung Cancer  01/18/2023   Yearly kidney function blood test for diabetes  05/16/2023   Yearly kidney health urinalysis for diabetes  05/16/2023   Eye exam for diabetics  07/08/2023   Medicare Annual Wellness Visit  09/23/2023   Colon Cancer Screening  09/15/2026   Pneumonia Vaccine  Completed   Flu Shot  Completed   Hepatitis C Screening: USPSTF Recommendation to screen - Ages 18-79 yo.  Completed   HPV Vaccine  Aged [address], [place]   09/22/2022   Nurse Notes: Approximately 30 minute Non-Face -To-Face Medicare Wellness Visit

## 2022-09-22 NOTE — Patient Instructions (Signed)
Health Maintenance, Male Adopting a healthy lifestyle and getting preventive care are important in promoting health and wellness. Ask your health care provider about: The right schedule for you to have regular tests and exams. Things you can do on your own to prevent diseases and keep yourself healthy. What should I know about diet, weight, and exercise? Eat a healthy diet  Eat a diet that includes plenty of vegetables, fruits, low-fat dairy products, and lean protein. Do not eat a lot of foods that are high in solid fats, added sugars, or sodium. Maintain a healthy weight Body mass index (BMI) is a measurement that can be used to identify possible weight problems. It estimates body fat based on height and weight. Your health care provider can help determine your BMI and help you achieve or maintain a healthy weight. Get regular exercise Get regular exercise. This is one of the most important things you can do for your health. Most adults should: Exercise for at least 150 minutes each week. The exercise should increase your heart rate and make you sweat (moderate-intensity exercise). Do strengthening exercises at least twice a week. This is in addition to the moderate-intensity exercise. Spend less time sitting. Even light physical activity can be beneficial. Watch cholesterol and blood lipids Have your blood tested for lipids and cholesterol at 76 years of age, then have this test every 5 years. You may need to have your cholesterol levels checked more often if: Your lipid or cholesterol levels are high. You are older than 76 years of age. You are at high risk for heart disease. What should I know about cancer screening? Many types of cancers can be detected early and may often be prevented. Depending on your health history and family history, you may need to have cancer screening at various ages. This may include screening for: Colorectal cancer. Prostate cancer. Skin cancer. Lung  cancer. What should I know about heart disease, diabetes, and high blood pressure? Blood pressure and heart disease High blood pressure causes heart disease and increases the risk of stroke. This is more likely to develop in people who have high blood pressure readings or are overweight. Talk with your health care provider about your target blood pressure readings. Have your blood pressure checked: Every 3-5 years if you are 18-39 years of age. Every year if you are 40 years old or older. If you are between the ages of 65 and 75 and are a current or former smoker, ask your health care provider if you should have a one-time screening for abdominal aortic aneurysm (AAA). Diabetes Have regular diabetes screenings. This checks your fasting blood sugar level. Have the screening done: Once every three years after age 45 if you are at a normal weight and have a low risk for diabetes. More often and at a younger age if you are overweight or have a high risk for diabetes. What should I know about preventing infection? Hepatitis B If you have a higher risk for hepatitis B, you should be screened for this virus. Talk with your health care provider to find out if you are at risk for hepatitis B infection. Hepatitis C Blood testing is recommended for: Everyone born from 1945 through 1965. Anyone with known risk factors for hepatitis C. Sexually transmitted infections (STIs) You should be screened each year for STIs, including gonorrhea and chlamydia, if: You are sexually active and are younger than 76 years of age. You are older than 76 years of age and your   health care provider tells you that you are at risk for this type of infection. Your sexual activity has changed since you were last screened, and you are at increased risk for chlamydia or gonorrhea. Ask your health care provider if you are at risk. Ask your health care provider about whether you are at high risk for HIV. Your health care provider  may recommend a prescription medicine to help prevent HIV infection. If you choose to take medicine to prevent HIV, you should first get tested for HIV. You should then be tested every 3 months for as long as you are taking the medicine. Follow these instructions at home: Alcohol use Do not drink alcohol if your health care provider tells you not to drink. If you drink alcohol: Limit how much you have to 0-2 drinks a day. Know how much alcohol is in your drink. In the [place] one drink equals one 12 oz bottle of beer (355 mL), one 5 oz glass of wine (148 mL), or one 1 oz glass of hard liquor (44 mL). Lifestyle Do not use any products that contain nicotine or tobacco. These products include cigarettes, chewing tobacco, and vaping devices, such as e-cigarettes. If you need help quitting, ask your health care provider. Do not use street drugs. Do not share needles. Ask your health care provider for help if you need support or information about quitting drugs. General instructions Schedule regular health, dental, and eye exams. Stay current with your vaccines. Tell your health care provider if: You often feel depressed. You have ever been abused or do not feel safe at home. Summary Adopting a healthy lifestyle and getting preventive care are important in promoting health and wellness. Follow your health care provider's instructions about healthy diet, exercising, and getting tested or screened for diseases. Follow your health care provider's instructions on monitoring your cholesterol and blood pressure. This information is not intended to replace advice given to you by your health care provider. Make sure you discuss any questions you have with your health care provider. Document Revised: 12/06/2020 Document Reviewed: 12/06/2020 Elsevier Patient Education  2023 Elsevier Inc.  

## 2022-10-13 ENCOUNTER — Other Ambulatory Visit: Payer: Self-pay

## 2022-10-13 MED ORDER — LOSARTAN POTASSIUM 25 MG PO TABS: 25.0000 mg | ORAL_TABLET | Freq: Every day | ORAL | 1 refills | Status: DC

## 2022-10-13 MED ORDER — CLOPIDOGREL BISULFATE 75 MG PO TABS: 75.0000 mg | ORAL_TABLET | Freq: Every day | ORAL | 1 refills | Status: DC

## 2022-11-13 ENCOUNTER — Other Ambulatory Visit: Payer: Self-pay

## 2022-11-13 MED ORDER — ROSUVASTATIN CALCIUM 10 MG PO TABS: 10.0000 mg | ORAL_TABLET | Freq: Every day | ORAL | 1 refills | Status: DC

## 2022-11-30 ENCOUNTER — Ambulatory Visit (INDEPENDENT_AMBULATORY_CARE_PROVIDER_SITE_OTHER): Payer: Medicare Other | Admitting: Family Medicine

## 2022-11-30 VITALS — BP 104/60 | HR 74 | Ht 73.0 in | Wt 206.4 lb

## 2022-11-30 DIAGNOSIS — R109 Unspecified abdominal pain: Secondary | ICD-10-CM

## 2022-11-30 NOTE — Patient Instructions (Signed)
It was great to meet you!  It's not exactly clear what's causing your discomfort. There are no "red flags" or obvious abnormalities on your exam.  Let's monitor your symptoms over the next 1-2 weeks. If no improvement, let us know.  If you develop pain with urination, swelling in a testicle, blood in your urine, fever, vomiting, or any other concerns please let us know.   Take care, Dr Anner Crete

## 2022-11-30 NOTE — Assessment & Plan Note (Addendum)
1 week of nonspecific lower abdominal discomfort. Physical exam including GU exam is unremarkable. Differential includes MSK pain, gas, constipation, kidney or lower urinary tract stone, less likely diverticulitis, UTI, epididymitis, prostatitis. UTD on colonoscopy. Reassurance provided. Discussed checking UA and basic labs but through shared decision making we opted to monitor for now. Return if persistent or worsening.

## 2022-11-30 NOTE — Progress Notes (Signed)
    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, does a lot of core work but it seems unrelated -also notes he tweaked his back while deadlifting 2 weeks ago  PERTINENT  PMH / PSH: CAD, HTN, h/o oral cancer  OBJECTIVE:   BP 104/60   Pulse 74   Ht 6\' 1"  (1.854 m)   Wt 206 lb 6.4 oz (93.6 kg)   SpO2 97%   BMI 27.23 kg/m   Gen: NAD, pleasant, able to participate in exam CV: RRR, normal S1/S2, no murmur Resp: Normal effort, lungs CTAB GI: soft, nontender, nondistended, no masses GU: normal male, no scrotal edema or tenderness, no appreciable varicocele Hips: FROM, 5/5 strength with resisted flexion, negative FABER/FADIR Extremities: no edema or cyanosis Skin: warm and dry, no rashes noted Neuro: alert, no obvious focal deficits Psych: Normal affect and mood   ASSESSMENT/PLAN:   Abdominal pain 1 week of nonspecific lower abdominal discomfort. Physical exam including GU exam is unremarkable. Differential includes MSK pain, gas, constipation, kidney or lower urinary tract stone, less likely diverticulitis, UTI, epididymitis, prostatitis. UTD on colonoscopy. Reassurance provided. Discussed checking UA and basic labs but through shared decision making we opted to monitor for now. Return if persistent or worsening.    Maury Dus, MD [facility]

## 2022-12-04 ENCOUNTER — Encounter: Payer: Self-pay | Admitting: Family Medicine

## 2022-12-04 DIAGNOSIS — H8110 Benign paroxysmal vertigo, unspecified ear: Secondary | ICD-10-CM

## 2022-12-05 ENCOUNTER — Ambulatory Visit (INDEPENDENT_AMBULATORY_CARE_PROVIDER_SITE_OTHER): Payer: Medicare Other | Admitting: Family Medicine

## 2022-12-05 ENCOUNTER — Encounter: Payer: Self-pay | Admitting: Family Medicine

## 2022-12-05 VITALS — BP 123/69 | HR 55 | Ht 73.0 in | Wt 207.0 lb

## 2022-12-05 DIAGNOSIS — R1030 Lower abdominal pain, unspecified: Secondary | ICD-10-CM | POA: Diagnosis not present

## 2022-12-05 LAB — POCT URINALYSIS DIP (MANUAL ENTRY)
Bilirubin, UA: NEGATIVE
Blood, UA: NEGATIVE
Glucose, UA: NEGATIVE mg/dL
Leukocytes, UA: NEGATIVE
Nitrite, UA: NEGATIVE
Protein Ur, POC: NEGATIVE mg/dL
Spec Grav, UA: 1.025 (ref 1.010–1.025)
Urobilinogen, UA: 0.2 E.U./dL
pH, UA: 5.5 (ref 5.0–8.0)

## 2022-12-05 NOTE — Progress Notes (Unsigned)
    SUBJECTIVE:   CHIEF COMPLAINT / HPI:   Abdominal Pain Bilateral low abdominal pain. Started about 2 weeks ago. Seen for same on 11/30/22 but history and exam were largely unremarkable. We opted for monitoring of symptoms.  Returns today reporting his symptoms are unchanged. Right worse than left. Aggravated by walking and standing. Taking Tylenol  Wondering if it could be a hernia. His father and brother both had bilateral hernias that were surgically repaired.  PERTINENT  PMH / PSH: CAD, HTN, prediabetes, h/o oral cancer  OBJECTIVE:   There were no vitals taken for this visit.  ***  ASSESSMENT/PLAN:   No problem-specific Assessment & Plan notes found for this encounter.     Maury Dus, MD [facility]

## 2022-12-05 NOTE — Patient Instructions (Addendum)
It was great to see you!  I'm sorry you're still experiencing abdominal discomfort. We will check some urine studies and an imaging test to further evaluate the source of your pain.  In the meantime, take Aleve (2 tablets twice daily) for the next 1 week.  IF all tests come back normal, this is likely muscular pain related to one of your hip flexor muscles and we can consider dedicated physical therapy and/or sports medicine referral.   Take care, Dr Anner Crete

## 2022-12-06 NOTE — Assessment & Plan Note (Addendum)
Bilateral (R>L) lower abdominal pain that has been persistent x2 weeks and now interfering with daily function. Exam largely unremarkable aside from mild tenderness to deep palpation of RLQ. Differential unchanged from prior and is broad including MSK pain, UTI, hernia, atypical presentation of urinary tract stone, colitis, diverticulitis, appendicitis. UA unremarkable. Will obtain CT abdomen/pelvis with/without contrast. Discussed risks/benefits of abdominal ultrasound but patient wishes to proceed with CT which is reasonable. Advised Aleve 2 tablets BID x1 week in the meantime.

## 2022-12-09 ENCOUNTER — Other Ambulatory Visit (HOSPITAL_BASED_OUTPATIENT_CLINIC_OR_DEPARTMENT_OTHER): Payer: Medicare Other

## 2022-12-14 ENCOUNTER — Other Ambulatory Visit: Payer: Self-pay | Admitting: Family Medicine

## 2022-12-14 DIAGNOSIS — H8110 Benign paroxysmal vertigo, unspecified ear: Secondary | ICD-10-CM

## 2022-12-14 NOTE — Telephone Encounter (Signed)
Referral correction made and outpatient neuro rehab will call patient to set up an appt.  Alexzavier Girardin,CMA

## 2022-12-16 ENCOUNTER — Telehealth: Payer: Self-pay | Admitting: Student

## 2022-12-16 ENCOUNTER — Other Ambulatory Visit: Payer: Self-pay | Admitting: Student

## 2022-12-16 MED ORDER — PREDNISONE 20 MG PO TABS
20.0000 mg | ORAL_TABLET | Freq: Every day | ORAL | 0 refills | Status: DC
Start: 2022-12-16 — End: 2023-03-16

## 2022-12-16 MED ORDER — PREDNISONE 20 MG PO TABS: ORAL_TABLET | ORAL | 0 refills | Status: DC

## 2022-12-16 NOTE — Telephone Encounter (Signed)
Resident contacted me about this patient. I sent message to staff to help him schedule an inperson appointment to be evaluated yesterday. Okay to give 3 days worth of prednisone. Resident to offer and help him schedule an inperson appointment as early as possible.

## 2022-12-16 NOTE — Telephone Encounter (Signed)
Scheduled patient for in-person visit on Monday, May 20th at 9:45 AM with Dr. Glendale Chard.

## 2022-12-16 NOTE — Progress Notes (Signed)
[facility] on [address] closed today.  Re-sent prednisone script to [facility] on [address]

## 2022-12-16 NOTE — Telephone Encounter (Signed)
**   After- Hours Call **  Received call from Mr. Shane Shelton with concern for bed bug bites.  Went on vacation, stayed at a hotel and concerned that he got bed bugs. Saw something on his wrist a day or two before,  yesterday sent Dr. Lum Babe pictures of his back. He says he reacts badly to bug bites.  Woke up this AM with worsened bites. Has been taking PO Benadryl 25 mg every 6 hours. No fever, chills, or other symptoms. No difficulty breathing. He is also using topical benadryl cream.  Significant other also with similar bites.  Emphasized to come to clinic for evaluation on Monday, that in-person evaluation is warranted. Will send in PO prednisone 20 mg x3 days. Provided return precautions.  Darral Dash, DO PGY-2 [facility]

## 2022-12-18 ENCOUNTER — Ambulatory Visit (INDEPENDENT_AMBULATORY_CARE_PROVIDER_SITE_OTHER): Payer: Medicare Other | Admitting: Student

## 2022-12-18 ENCOUNTER — Other Ambulatory Visit: Payer: Self-pay

## 2022-12-18 ENCOUNTER — Encounter: Payer: Self-pay | Admitting: Student

## 2022-12-18 ENCOUNTER — Ambulatory Visit: Payer: Medicare Other | Attending: Family Medicine | Admitting: Physical Therapy

## 2022-12-18 ENCOUNTER — Encounter: Payer: Self-pay | Admitting: Physical Therapy

## 2022-12-18 VITALS — BP 133/71 | HR 49 | Ht 73.0 in | Wt 207.6 lb

## 2022-12-18 VITALS — BP 116/67 | HR 52

## 2022-12-18 DIAGNOSIS — R1031 Right lower quadrant pain: Secondary | ICD-10-CM

## 2022-12-18 DIAGNOSIS — R42 Dizziness and giddiness: Secondary | ICD-10-CM

## 2022-12-18 DIAGNOSIS — L299 Pruritus, unspecified: Secondary | ICD-10-CM | POA: Diagnosis not present

## 2022-12-18 NOTE — Progress Notes (Signed)
    SUBJECTIVE:   CHIEF COMPLAINT / HPI:   Shane Shelton is a 77 y.o. male  presenting for follow up of bed bug bites.  He reports traveling to a hotel and 3 to 4 days later erupting and severe bug bites on his face, arms, back, chest, thighs.  He reports being prescribed a 3-day course of prednisone and has been using topical Benadryl and cortisone with good symptomatic relief.  Denies severe swelling, pain, drainage from any of the bug bites.   Inguinal pain: He reports he was seen by Dr. Anner Crete on 12/05/2022.  They confirmed it was not hernia at the time but did order a CT pelvis.  He reports his pain is completely resolved and he is wondering if he will still need imaging.  PERTINENT  PMH / PSH: Reviewed and updated   OBJECTIVE:   BP 133/71   Pulse (!) 49   Ht 6\' 1"  (1.854 m)   Wt 207 lb 9.6 oz (94.2 kg)   SpO2 98%   BMI 27.39 kg/m   Well-appearing, no acute distress Cardio: Regular rate, regular rhythm, no murmurs on exam. Pulm: Clear, no wheezing, no crackles. No increased work of breathing Abdominal: bowel sounds present, soft, non-tender, non-distended Extremities: no peripheral edema  Neuro: alert and oriented x3, speech normal in content, no facial asymmetry, strength intact and equal bilaterally in UE and LE, pupils equal and reactive to light.  Skin: Multiple erythematous patches on trunk and extremities with minor crusting and edema.  No purulent drainage noted at any site.     12/18/2022    9:48 AM 11/30/2022   11:11 AM 09/22/2022    1:34 PM  PHQ9 SCORE ONLY  PHQ-9 Total Score 0 0 0      ASSESSMENT/PLAN:   No problem-specific Assessment & Plan notes found for this encounter.   Itching/swelling secondary to bedbug bites: Bites on back and arms appear to be resolving compared to the pictures he took at height of symptoms.  Lesions do not appear to be infected.  Instructed patient to continue with topical Benadryl and cortisone as needed.  Inguinal  pain: Resolved per patient.  He has an appointment with him [facility] imaging on Wednesday for CT pelvis.  He reports since his pain is resolved he is opting not to have imaging done.  Told patient he has a CT pelvis available for 1 year if the pain returns he can always reschedule.  Glendale Chard, DO [facility]

## 2022-12-18 NOTE — Patient Instructions (Signed)
It was great to see you today!   Today we addressed: Bug bites - continue using benadryl cream and steroid cream as needed. If you notice any drainage or increased tenderness to any of the bites come back in for evaluation.   Future Appointments  Date Time Provider Department Center  12/20/2022  6:00 PM [facility]-CT 1 [facility]  12/29/2022  8:45 AM Westley Foots, PT [facility]  03/16/2023 11:00 AM Corky Crafts, MD [facility]  10/16/2023 12:00 PM [facility] ANNUAL WELLNESS VISIT [facility]    Please arrive 15 minutes before your appointment to ensure smooth check in process.    Please call the clinic at [phone] if your symptoms worsen or you have any concerns.  Thank you for allowing me to participate in your care, Dr. Glendale Chard [facility]

## 2022-12-18 NOTE — Therapy (Signed)
OUTPATIENT PHYSICAL THERAPY VESTIBULAR EVALUATION     Patient Name: Shane Shelton MRN: [id number] DOB:1946/01/07, 77 y.o., male Today's Date: 12/19/2022  END OF SESSION:  PT End of Session - 12/18/22 0848     Visit Number 1    Number of Visits 9   including eval   Authorization Type [employer]    PT Start Time 0800    PT Stop Time 0845    PT Time Calculation (min) 45 min    Equipment Utilized During Treatment Other (comment)   seated session; not indicated   Activity Tolerance Patient tolerated treatment well    Behavior During Therapy WFL for tasks assessed/performed             Past Medical History:  Diagnosis Date   Allergy    hayfever   Cancer (HCC)    Throat cancer 2011   Carotid artery occlusion    Coronary artery disease    Hyperlipidemia    Hypertension    Tubular adenoma of colon 11/25/2013   Past Surgical History:  Procedure Laterality Date   CARDIAC CATHETERIZATION  05/08/2017   S/P S/P PCI, DES of Mid RCA at [facility] in [place]   COLONOSCOPY     multiple   CORONARY PRESSURE/FFR STUDY N/A 10/09/2019   Procedure: INTRAVASCULAR PRESSURE WIRE/FFR STUDY;  Surgeon: Lyn Records, MD;  [facility] IN[facility];  Service: Cardiovascular;  Laterality: N/A;   ESOPHAGOGASTRODUODENOSCOPY     multiple   ESOPHAGOSCOPY W/ PERCUTANEOUS GASTROSTOMY TUBE PLACEMENT  2011   LEFT HEART CATH AND CORONARY ANGIOGRAPHY N/A 10/09/2019   Procedure: LEFT HEART CATH AND CORONARY ANGIOGRAPHY;  Surgeon: Lyn Records, MD;  Location: [facility] INVASIVE CV LAB;  Service: Cardiovascular;  Laterality: N/A;   Patient Active Problem List   Diagnosis Date Noted   BPPV (benign paroxysmal positional vertigo) 01/17/2022   Weight loss 12/23/2021   Abdominal pain 12/23/2021   Prediabetes 04/25/2021   Achilles tendinitis 03/30/2020   Preventative health care 03/28/2019   Former smoker 03/28/2019   Essential hypertension 05/17/2017   Premature  ventricular contractions 02/14/2017   Left ventricular hypertrophy 02/14/2017   Hx of adenomatous polyp of colon 02/12/2017   Pulmonary nodules/lesions, multiple 02/12/2017   Neuropathy 12/14/2016   Paresthesia of both legs 12/14/2016   History of skin cancer 12/14/2016   Hypercholesterolemia 10/05/2016   Coronary artery disease 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) 10/05/2016   History of oral cancer 10/05/2016   GERD (gastroesophageal reflux disease) 10/05/2016   Lumbar back pain with radiculopathy affecting left lower extremity 10/05/2016    PCP: Doreene Eland, MD REFERRING PROVIDER: Doreene Eland, MD  REFERRING DIAG: H81.10 (ICD-10-CM) - Benign paroxysmal positional vertigo, unspecified laterality  THERAPY DIAG:  Dizziness and giddiness - Plan: PT plan of care cert/re-cert  ONSET DATE: 12/14/2022  Rationale for Evaluation and Treatment: Rehabilitation  SUBJECTIVE:   SUBJECTIVE STATEMENT: Patient was last seen at this clinic June 2023 for R posterior canal BPPV. Patinet reports that he experienced a repeat of symptoms Wednesday or Thursday of last week. Patient thinks it might have occurred to the R again getting up out of bed. Patient reports that it seems more mild this time. Patient reports that it felt like it last for 30 seconds to a minute or two. Patient reports he .   Pt accompanied by: self  PERTINENT HISTORY: two stent placements, stenosis of caratoid, throat cancer in remission, CAD,  R Posterior Canal BPPV treated 12/2021  PAIN:  Are you having pain? No  PRECAUTIONS: Fall  WEIGHT BEARING RESTRICTIONS: No  FALLS: Has patient fallen in last 6 months? No  LIVING ENVIRONMENT: Lives with: lives with their spouse Lives in: House/apartment Stairs: Yes: Internal: 14 steps; on right going up and External: 2 steps; none Has following equipment at home: None  PLOF: Independent  PATIENT GOALS: "To alleviate my symptoms  BPPV."   OBJECTIVE:   DIAGNOSTIC FINDINGS: No relevant recent imaging  COGNITION: Overall cognitive status: Within functional limits for tasks assessed   SENSATION: WFL  EDEMA:  WFL  POSTURE:  rounded shoulders and forward head  Cervical ROM:    Grossly WFL - no pain throughout  GAIT: Gait pattern: Over level ground no major signs of instability noted, patient reports increased difficulty with higher level balance tasks Distance walked: 4 x 60 feet Assistive device utilized: None Level of assistance: Modified independence  PATIENT SURVEYS:  FOTO 48  VESTIBULAR ASSESSMENT:  GENERAL OBSERVATION: No major cervical guarding, ambulating without AD   SYMPTOM BEHAVIOR:  Subjective history: see above  Non-Vestibular symptoms: tinnitus Type of dizziness: Imbalance (Disequilibrium), Spinning/Vertigo, Unsteady with head/body turns, Lightheadedness/Faint, "Funny feeling in the head", and "Swimmyheaded"  Frequency: every few days  Duration: a few second to a few minutes  Aggravating factors:  moving too quickly  Relieving factors:  sitting down  Progression of symptoms: better  VBI: negative bilaterlly  OCULOMOTOR EXAM:  Ocular Alignment: normal  Ocular ROM: No Limitations  Spontaneous Nystagmus: absent  Gaze-Induced Nystagmus: absent  Smooth Pursuits: intact  Saccades: intact  Convergence/Divergence: < 5 cm   VESTIBULAR - OCULAR REFLEX:   Slow VOR: Normal - reports minor fuzzy feeling  VOR Cancellation: Normal  Head-Impulse Test: HIT Right: negative HIT Left: positive - mild delay and corrective saccade  Dynamic Visual Acuity: To be assessed   POSITIONAL TESTING:  Right Dix-Hallpike: no nystagmus Left Dix-Hallpike: no nystagmus Right Roll Test: no nystagmus Left Roll Test: no nystagmus *Reports mild subjective dizziness in R [employer] but states it feel different than previously BPPV with no room spinning just general brief "off" feeling  MOTION  SENSITIVITY:  Motion Sensitivity Quotient Intensity: 0 = none, 1 = Lightheaded, 2 = Mild, 3 = Moderate, 4 = Severe, 5 = Vomiting  Intensity  1. Sitting to supine   2. Supine to L side 1  3. Supine to R side 1  4. Supine to sitting 2  5. L Hallpike-Dix 0  6. Up from L  0  7. R Hallpike-Dix 2  8. Up from R  2  9. Sitting, head tipped to L knee 0  10. Head up from L knee 1  11. Sitting, head tipped to R knee 0  12. Head up from R knee 1  13. Sitting head turns x5 1  14.Sitting head nods x5 1.5  15. In stance, 180 turn to L  1  16. In stance, 180 turn to R 2    VESTIBULAR TREATMENT:  Initial Eval only  PATIENT EDUCATION: Education details: POC, examination findings, goal collaboration Person educated: Patient Education method: Explanation Education comprehension: verbalized understanding and needs further education  HOME EXERCISE PROGRAM:  To be provided   GOALS: Goals reviewed with patient? Yes  LONG TERM GOALS: Target date: 01/30/2023 (STG = LTG due to POC date)  Patient will report demonstrate independence with final HEP in order to maintain current gains and continue to progress after physical therapy discharge.   Baseline: To be provided Goal status: INITIAL  2.  Patient will improve FOTO score to 65 to achieve predicted improvements in functional mobility due to skilled physical therapy interventions to increase safety with and participation in daily activities.  Baseline: 48 Goal status: INITIAL  3.  Patient will improve MSQ symptom reports to less than 2 to indicate reduction in motion sensitivity in order to progress towards baseline function.   Baseline: Greatest 2/5 Goal status: INITIAL  4.  SOT to be assessed/goal written as indicated Baseline: To be assessed Goal status: INITIAL  ASSESSMENT:  CLINICAL IMPRESSION: Patient is a 77 y.o. male who was seen  today for physical therapy evaluation and treatment for dizziness with PMH of CAD, hypertension, and throat cancer in remission. Patient was previously treated at this clinic for R posterior canal BPPV in 12/2021. Based on patient's symptom presentation in today's session recurrence of BPPV not noted; however, indicators of possible mild L hypofunction given positive L head impulse test in addition to underlying residual motion sensitivity that correlates with former BPPV are noted during session. No indicators of central forms of dizziness noted. Patient will benefit from skilled physical therapy services to work on habituation/adaptation exercises.   OBJECTIVE IMPAIRMENTS: decreased balance and dizziness.   ACTIVITY LIMITATIONS: bed mobility and fast turns of head with walking  PARTICIPATION LIMITATIONS: community activity and house work  PERSONAL FACTORS: Age and 3+ comorbidities: see above  are also affecting patient's functional outcome.   REHAB POTENTIAL: Good  CLINICAL DECISION MAKING: Stable/uncomplicated  EVALUATION COMPLEXITY: Low   PLAN:  PT FREQUENCY: 2x/week  PT DURATION: 4 weeks  PLANNED INTERVENTIONS: Therapeutic exercises, Therapeutic activity, Neuromuscular re-education, Balance training, Gait training, Patient/Family education, Self Care, Vestibular training, and Re-evaluation  PLAN FOR NEXT SESSION: assess SOT/and write goal, assess DVA, work on VOR x 1 viewing, Brandt-Deroff exercises   Carmelia Bake, PT, DPT 12/19/2022, 7:44 AM

## 2022-12-19 ENCOUNTER — Encounter: Payer: Self-pay | Admitting: Physical Therapy

## 2022-12-19 NOTE — Telephone Encounter (Signed)
Patient was seen yesterday he stated by Dr.Miller and Dr.Dameron for skin rash.

## 2022-12-20 ENCOUNTER — Ambulatory Visit (HOSPITAL_BASED_OUTPATIENT_CLINIC_OR_DEPARTMENT_OTHER): Admission: RE | Admit: 2022-12-20 | Payer: Medicare Other | Source: Ambulatory Visit

## 2022-12-29 ENCOUNTER — Ambulatory Visit: Payer: Medicare Other

## 2023-01-01 ENCOUNTER — Ambulatory Visit: Payer: Medicare Other | Attending: Family Medicine | Admitting: Physical Therapy

## 2023-01-01 VITALS — BP 114/66 | HR 46

## 2023-01-01 DIAGNOSIS — R42 Dizziness and giddiness: Secondary | ICD-10-CM | POA: Insufficient documentation

## 2023-01-01 DIAGNOSIS — H8111 Benign paroxysmal vertigo, right ear: Secondary | ICD-10-CM | POA: Diagnosis not present

## 2023-01-01 NOTE — Therapy (Unsigned)
OUTPATIENT PHYSICAL THERAPY VESTIBULAR TREATMENT NOTE/DISCHARGE SUMMARY     Patient Name: Shane Shelton MRN: [id number] DOB:06/27/1946, 77 y.o., male Today's Date: 01/02/2023  END OF SESSION:  PT End of Session - 01/02/23 2007     Visit Number 2    Number of Visits 9   including eval   Authorization Type [employer]    PT Start Time [id number]    PT Stop Time [phone]    PT Time Calculation (min) 39 min    Equipment Utilized During Treatment --   seated session; not indicated   Activity Tolerance Patient tolerated treatment well    Behavior During Therapy [facility] for tasks assessed/performed              Past Medical History:  Diagnosis Date   Allergy    hayfever   Cancer (HCC)    Throat cancer 2011   Carotid artery occlusion    Coronary artery disease    Hyperlipidemia    Hypertension    Tubular adenoma of colon 11/25/2013   Past Surgical History:  Procedure Laterality Date   CARDIAC CATHETERIZATION  05/08/2017   S/P S/P PCI, DES of Mid RCA at [facility] in [place]   COLONOSCOPY     multiple   CORONARY PRESSURE/FFR STUDY N/A 10/09/2019   Procedure: INTRAVASCULAR PRESSURE WIRE/FFR STUDY;  Surgeon: Lyn Records, MD;  Location[facility];  Service: Cardiovascular;  Laterality: N/A;   ESOPHAGOGASTRODUODENOSCOPY     multiple   ESOPHAGOSCOPY W/ PERCUTANEOUS GASTROSTOMY TUBE PLACEMENT  2011   LEFT HEART CATH AND CORONARY ANGIOGRAPHY N/A 10/09/2019   Procedure: LEFT HEART CATH AND CORONARY ANGIOGRAPHY;  Surgeon: Lyn Records, MD;  Location: [facility];  Service: Cardiovascular;  Laterality: N/A;   Patient Active Problem List   Diagnosis Date Noted   BPPV (benign paroxysmal positional vertigo) 01/17/2022   Weight loss 12/23/2021   Abdominal pain 12/23/2021   Prediabetes 04/25/2021   Achilles tendinitis 03/30/2020   Preventative health care 03/28/2019   Former smoker 03/28/2019   Essential hypertension 05/17/2017    Premature ventricular contractions 02/14/2017   Left ventricular hypertrophy 02/14/2017   Hx of adenomatous polyp of colon 02/12/2017   Pulmonary nodules/lesions, multiple 02/12/2017   Neuropathy 12/14/2016   Paresthesia of both legs 12/14/2016   History of skin cancer 12/14/2016   Hypercholesterolemia 10/05/2016   Coronary artery disease 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) 10/05/2016   History of oral cancer 10/05/2016   GERD (gastroesophageal reflux disease) 10/05/2016   Lumbar back pain with radiculopathy affecting left lower extremity 10/05/2016    PCP: Doreene Eland, MD REFERRING PROVIDER: Doreene Eland, MD  REFERRING DIAG: H81.10 (ICD-10-CM) - Benign paroxysmal positional vertigo, unspecified laterality  THERAPY DIAG:  BPPV (benign paroxysmal positional vertigo), right  Dizziness and giddiness  ONSET DATE: 12/14/2022  Rationale for Evaluation and Treatment: Rehabilitation  SUBJECTIVE:   SUBJECTIVE STATEMENT: Patient reports the dizziness is intermittent - does not happen every day; quick movements make it worse - has not found a correlation to anything; states he did feel it this morning.   Pt accompanied by: self  PERTINENT HISTORY: two stent placements, stenosis of caratoid, throat cancer in remission, CAD, R Posterior Canal BPPV treated 12/2021  PAIN:  Are you having pain? No  PRECAUTIONS: Fall  WEIGHT BEARING RESTRICTIONS: No  FALLS: Has patient fallen in last 6 months? No  LIVING ENVIRONMENT: Lives with: lives with  their spouse Lives in: House/apartment Stairs: Yes: Internal: 14 steps; on right going up and External: 2 steps; none Has following equipment at home: None  PLOF: Independent  PATIENT GOALS: "To alleviate my symptoms BPPV."   OBJECTIVE:   Vitals:   01/01/23 0904 01/01/23 0907  BP: 114/60 114/66  Pulse: (!) 46       Seated position      Standing position      GAIT: Gait pattern:  WNL's Distance walked: 50' Assistive device utilized: None Level of assistance: Modified independence  PATIENT SURVEYS:  FOTO 48  VESTIBULAR ASSESSMENT:   POSITIONAL TESTING:  Right Dix-Hallpike: no nystagmus Left Dix-Hallpike: no nystagmus Right Roll Test: no nystagmus Left Roll Test: no nystagmus *Reports mild subjective dizziness in R [employer] but states it feel different than previously BPPV with no room spinning just general brief "off" feeling  Rt and Lt sidelying tests (-) with no nystagmus and no c/o dizziness in either position    PATIENT EDUCATION: Education details:  reviewed Epley maneuver - pt declined having maneuver administered in today's session due to no true spinning vertigo Person educated: Patient Education method: Explanation Education comprehension: verbalized understanding and needs further education  HOME EXERCISE PROGRAM:  To be provided   GOALS: Goals reviewed with patient? Yes  LONG TERM GOALS: Target date: 01/30/2023 (STG = LTG due to POC date)  Patient will report demonstrate independence with final HEP in order to maintain current gains and continue to progress after physical therapy discharge.   Baseline: To be provided Goal status: Deferred   2.  Patient will improve FOTO score to 65 to achieve predicted improvements in functional mobility due to skilled physical therapy interventions to increase safety with and participation in daily activities.  Baseline: 48 Goal status: Deferred due to 1 treatment only after eval  3.  Patient will improve MSQ symptom reports to less than 2 to indicate reduction in motion sensitivity in order to progress towards baseline function.   Baseline: Greatest 2/5 Goal status: Deferred  4.  SOT to be assessed/goal written as indicated Baseline: To be assessed Goal status:  deferred - pt declined balance testing due to no deficits noted impacting function  ASSESSMENT:  CLINICAL IMPRESSION: Patient has  negative positional testing with no nystagmus noted in either Rt or Lt Dix-Hallpike test positions or in sidelying position.  Pt did subjectively report dizziness in Rt Dix-Hallpike test position but stated it was not spinning vertigo.  Pt declined balance assessment at this time, reporting no balance deficits present which were impacting his function.  Pt is discharged due to negative positional testing for BPPV at this time - pt stated he would return in future if needed.   OBJECTIVE IMPAIRMENTS: decreased balance and dizziness.   ACTIVITY LIMITATIONS: bed mobility and fast turns of head with walking  PARTICIPATION LIMITATIONS: community activity and house work  PERSONAL FACTORS: Age and 3+ comorbidities: see above  are also affecting patient's functional outcome.   REHAB POTENTIAL: Good  CLINICAL DECISION MAKING: Stable/uncomplicated  EVALUATION COMPLEXITY: Low   PLAN:  PT FREQUENCY: 2x/week  PT DURATION: 4 weeks  PLANNED INTERVENTIONS: Therapeutic exercises, Therapeutic activity, Neuromuscular re-education, Balance training, Gait training, Patient/Family education, Self Care, Vestibular training, and Re-evaluation  PLAN FOR NEXT SESSION:    D/C per pt's request     PHYSICAL THERAPY DISCHARGE SUMMARY  Visits from Start of Care: 2  Current functional level related to goals / functional outcomes: See above - no signs  or symptoms of BPPV present at this time   Remaining deficits: Very mild non-specific dizziness/lightheadedness with transitional movements   Education / Equipment: No HEP provided due to no signs of BPPV at this time; pt reports he knows Epley maneuver for self treatment prn   Patient agrees to discharge. Patient goals were  deferred as pt declined balance testing - all positional testing negative at this time . Patient is being discharged due to the patient's request.    Kary Kos, PT 01/02/2023, 8:11 PM

## 2023-01-02 ENCOUNTER — Encounter: Payer: Self-pay | Admitting: Physical Therapy

## 2023-01-02 ENCOUNTER — Other Ambulatory Visit: Payer: Self-pay | Admitting: Pharmacist

## 2023-01-02 DIAGNOSIS — E7800 Pure hypercholesterolemia, unspecified: Secondary | ICD-10-CM

## 2023-01-02 MED ORDER — REPATHA SURECLICK 140 MG/ML ~~LOC~~ SOAJ
SUBCUTANEOUS | 3 refills | Status: DC
Start: 2023-01-02 — End: 2023-11-20

## 2023-01-04 ENCOUNTER — Telehealth: Payer: Self-pay | Admitting: Physical Therapy

## 2023-01-04 NOTE — Telephone Encounter (Signed)
Patient called unsure of why the use of the word giddiness was used in billing code. Therapist called back and explained that this is due to how dizziness is coded under "dizziness and giddiness" not delineating between the two. Patient verbalized understanding and billing left as is with patient's verbal agreement.   Maryruth Eve, PT, DPT

## 2023-01-29 DIAGNOSIS — D225 Melanocytic nevi of trunk: Secondary | ICD-10-CM | POA: Diagnosis not present

## 2023-01-29 DIAGNOSIS — L821 Other seborrheic keratosis: Secondary | ICD-10-CM | POA: Diagnosis not present

## 2023-01-29 DIAGNOSIS — Z85828 Personal history of other malignant neoplasm of skin: Secondary | ICD-10-CM | POA: Diagnosis not present

## 2023-01-29 DIAGNOSIS — L814 Other melanin hyperpigmentation: Secondary | ICD-10-CM | POA: Diagnosis not present

## 2023-02-13 ENCOUNTER — Telehealth: Payer: Self-pay

## 2023-02-13 ENCOUNTER — Telehealth: Payer: Self-pay | Admitting: Interventional Cardiology

## 2023-02-13 NOTE — Telephone Encounter (Signed)
Pt states he is in [place] right now and he is feeling lightheaded and having SOB with exertion (no SOB right now). He states he has a bad respiratory infection, but unsure what he should do about his symptoms. Please advise.

## 2023-02-13 NOTE — Telephone Encounter (Signed)
Received VM from patient regarding concerns for increased HR, intermittent SHOB and lightheadedness.   Patient called again. He reports that he is currently in [place] and that symptoms have been going on for ~1 week. He also reports recent URI. He reports feeling better with rest. States that normal resting HR is in the 50's, current HR is in the mid 60's.    He denies radiating pain, no swelling, or chest pain.   Patient reports that he is going to be back in [place] on Thursday and would like an appointment for Friday. He is requesting an EKG to be performed Scheduled patient for Friday afternoon. Provided patient with ED precautions. Patient voices understanding, however, states that he really does not want to go to the ED.   Veronda Prude, RN

## 2023-02-13 NOTE — Telephone Encounter (Addendum)
I spoke with the pt and he reports that he is currently in [place] and he has had an upper respiratory infection... he has had increased SOB with minimal walking... he has had an aggressive cough and no edema of his extremities... he says his HR has been elevated and he thinks he needs an EKG ASAP when he gets home this Thursday.   He was demanding to get an appt this Friday but I advised him that there was no available open appt spots... I can get him in with the DOD DR Izora Ribas 02/21/23 but he needs to reach out to his PCP and should be tested for FLU and COVID as well.   Pt was upset and explained that he was a "smart guy" and he needs an EKG... I explained to him that we will be happy to see him for his heart but since he has to wait until next week to be seen it would be good to see his PCP prior to r/o any other pot  He says he refuses to go to any ED and no Urgent Care.. I will keep an eye out if any sooner cancellations and he will reach out to his PCP.

## 2023-02-16 ENCOUNTER — Encounter: Payer: Self-pay | Admitting: Student

## 2023-02-16 ENCOUNTER — Ambulatory Visit (INDEPENDENT_AMBULATORY_CARE_PROVIDER_SITE_OTHER): Payer: Medicare Other | Admitting: Student

## 2023-02-16 ENCOUNTER — Other Ambulatory Visit: Payer: Self-pay

## 2023-02-16 ENCOUNTER — Emergency Department (HOSPITAL_COMMUNITY)
Admission: EM | Admit: 2023-02-16 | Discharge: 2023-02-16 | Disposition: A | Discharge status: Home / Self Care | Payer: Medicare Other | Attending: Emergency Medicine | Admitting: Emergency Medicine

## 2023-02-16 ENCOUNTER — Encounter (HOSPITAL_COMMUNITY): Payer: Self-pay

## 2023-02-16 ENCOUNTER — Emergency Department (HOSPITAL_COMMUNITY): Payer: Medicare Other

## 2023-02-16 ENCOUNTER — Ambulatory Visit (HOSPITAL_COMMUNITY)
Admission: RE | Admit: 2023-02-16 | Discharge: 2023-02-16 | Disposition: A | Discharge status: Home / Self Care | Payer: Medicare Other | Source: Ambulatory Visit | Attending: Family Medicine | Admitting: Family Medicine

## 2023-02-16 VITALS — BP 108/65 | HR 65 | Ht 73.0 in | Wt 201.0 lb

## 2023-02-16 DIAGNOSIS — R918 Other nonspecific abnormal finding of lung field: Secondary | ICD-10-CM | POA: Diagnosis not present

## 2023-02-16 DIAGNOSIS — I2510 Atherosclerotic heart disease of native coronary artery without angina pectoris: Secondary | ICD-10-CM | POA: Diagnosis not present

## 2023-02-16 DIAGNOSIS — R001 Bradycardia, unspecified: Secondary | ICD-10-CM | POA: Insufficient documentation

## 2023-02-16 DIAGNOSIS — R079 Chest pain, unspecified: Secondary | ICD-10-CM | POA: Diagnosis not present

## 2023-02-16 DIAGNOSIS — I771 Stricture of artery: Secondary | ICD-10-CM | POA: Diagnosis not present

## 2023-02-16 DIAGNOSIS — R0789 Other chest pain: Secondary | ICD-10-CM | POA: Insufficient documentation

## 2023-02-16 LAB — CBC
HCT: 42.8 % (ref 39.0–52.0)
Hemoglobin: 14.3 g/dL (ref 13.0–17.0)
MCH: 31.2 pg (ref 26.0–34.0)
MCHC: 33.4 g/dL (ref 30.0–36.0)
MCV: 93.2 fL (ref 80.0–100.0)
Platelets: 240 10*3/uL (ref 150–400)
RBC: 4.59 MIL/uL (ref 4.22–5.81)
RDW: 12.8 % (ref 11.5–15.5)
WBC: 5.1 10*3/uL (ref 4.0–10.5)
nRBC: 0 % (ref 0.0–0.2)

## 2023-02-16 LAB — BASIC METABOLIC PANEL
Anion gap: 11 (ref 5–15)
BUN: 16 mg/dL (ref 8–23)
CO2: 23 mmol/L (ref 22–32)
Calcium: 9.4 mg/dL (ref 8.9–10.3)
Chloride: 104 mmol/L (ref 98–111)
Creatinine, Ser: 1.15 mg/dL (ref 0.61–1.24)
GFR, Estimated: >60 mL/min (ref >60)
Glucose, Bld: 167 mg/dL — ABNORMAL HIGH (ref 70–99)
Potassium: 4.2 mmol/L (ref 3.5–5.1)
Sodium: 138 mmol/L (ref 135–145)

## 2023-02-16 LAB — TROPONIN I (HIGH SENSITIVITY)
Troponin I (High Sensitivity): 4 ng/L (ref <18)
Troponin I (High Sensitivity): 4 ng/L (ref <18)

## 2023-02-16 NOTE — Progress Notes (Cosign Needed Addendum)
    SUBJECTIVE:   CHIEF COMPLAINT / HPI:   Lightheadedness and Chest Pain "Feeling shitty" since recent trip to [place]. Just got back yesterday. Started feeling lightheaded and chest tightness while on his trip. This was consistently worse with activity. Denies any chest pain or dyspnea, but again endorses "tightness." Feeling better now that he's back home but is still having the lightheaded sensation.    He does have significant cardiac history with two stents placed in 2021. Is on ASA and Plavix, previously on B blocker but this was stopped due to hypotension and bradycardia. He is on losartan and imdur. Has a cards appt on Wednesday. Has nitroglycerin tabs at home.   Cath in 2021   Segmental proximal 50 to 70% stenosis with irregularity and lucency.  DFR 0.93 and after contrast 0.89.  Distal LAD before bifurcating into a diagonal and small apical segment, 75 to 80%.  Could be the source of angina. Mid and distal left main 25%. Circumflex is relatively small with 3 obtuse marginal branches and no high-grade obstruction noted. RCA is dominant, previously stented throughout the mid segment.  Proximal to stents is a relatively lucent 30 to 50% narrowing.  There is also a 50% distal RCA stenosis.  The PDA is large and widely patent.  The first left ventricular branch contains ostial 80% narrowing.  The remainder of the right coronary artery which is large is widely patent. Normal LV function.     PERTINENT  PMH / PSH: CAD, HTN, h/o mouth cancer   OBJECTIVE:   BP 108/65   Pulse 65   Ht 6\' 1"  (1.854 m)   Wt 201 lb (91.2 kg)   SpO2 96%   BMI 26.52 kg/m   Gen: In good spirits, NAD Cardio: Regular rate and rhythm without murmur Pulm: Normal WOB on RA without wheezes, rales, or rhonchi MSK: BLE without edema or deformity   EKG with sinus bradycardia. Narrow QRS.  STE with notched J point and concave ST segment in leads II, III, aVF. This was present on previous EKGs but the  degree of STE has increased. There is also TWI in V2 which is new from previous.   ASSESSMENT/PLAN:   Chest tightness This is a concerning constellation of symptoms in this patient with a significant cardiac hx. While I am reassured by his appearance in clinic today, well appearing and not having active symptoms at the time of evaluation, the EKG changes from his most recent tracing raise concern. Though these changes could be explained by benign repolarization abnormality. ?Unstable angina picture. Discussed with him options for ED presentation this evening vs. close outpatient monitoring and trying to get him set up with an expedited echocardiogram. He elects to present to the ED this evening for troponin measurement and possible evaluation by cardiology team.  He is clinically stable to present by personal vehicle.      Eliezer Mccoy, MD [facility]

## 2023-02-16 NOTE — ED Provider Triage Note (Signed)
Emergency Medicine Provider Triage Evaluation Note  Kourtland Coopman , a 77 y.o. male  was evaluated in triage.  Pt complains of chest pain.  Review of Systems  Positive:  Negative:   Physical Exam  BP 120/70 (BP Location: Right Arm)   Pulse 62   Temp 98.4 F (36.9 C) (Oral)   Resp 16   Ht 6\' 1"  (1.854 m)   Wt 91.2 kg   SpO2 96%   BMI 26.52 kg/m  Gen:   Awake, no distress   Resp:  Normal effort  MSK:   Moves extremities without difficulty  Other:    Medical Decision Making  Medically screening exam initiated at 5:04 PM.  Appropriate orders placed.  Raynelle Dick Gashi was informed that the remainder of the evaluation will be completed by another provider, this initial triage assessment does not replace that evaluation, and the importance of remaining in the ED until their evaluation is complete.  Was in [place] this past week. Started feeling diaphoretic with chest tightness and dizziness/lightheaded. Thought it was altitude sickness. Also endorsing DOE. Symptoms were getting better. Saw PCP today who states they might have seen EKG changes. NTG didn't help.  Denies fever, abdominal pain, nausea, vomiting, diarrhea, rash.   Dorthy Cooler, [place] 02/16/23 1710

## 2023-02-16 NOTE — Assessment & Plan Note (Addendum)
This is a concerning constellation of symptoms in this patient with a significant cardiac hx. While I am reassured by his appearance in clinic today, well appearing and not having active symptoms at the time of evaluation, the EKG changes from his most recent tracing raise concern. Though these changes could be explained by benign repolarization abnormality. ?Unstable angina picture. Discussed with him options for ED presentation this evening vs. close outpatient monitoring and trying to get him set up with an expedited echocardiogram. He elects to present to the ED this evening for troponin measurement and possible evaluation by cardiology team.  He is clinically stable to present by personal vehicle.

## 2023-02-16 NOTE — ED Triage Notes (Signed)
Pt to ED c/o feeling light headiness, chest pain x 1 week, worse with exercise. Went to PCP, and concerned for EKG changes . Pt currently denies chest pain

## 2023-02-16 NOTE — Patient Instructions (Addendum)
As we discussed, there are some small but possibly significant changes to your EKG. The safest option is to get over to the ER. In the meantime, of course, if you have an acute chagne in status, please take a nitroglycerin and call 911.  Eliezer Mccoy, MD

## 2023-02-16 NOTE — ED Provider Notes (Signed)
EMERGENCY DEPARTMENT AT [facility] Provider Note   CSN: [id number] Arrival date & time: 02/16/23  4098     History Chief Complaint  Patient presents with   Chest Pain   Dizziness   ekg changes    HPI Shane Shelton is a 77 y.o. male presenting for Chief complaint of chest pain and dizziness intermittently over the last 2 weeks. Currently asymptomatic.  Was following up with his PCP and there were potential EKG changes that they could not review old EKGs.  He denies any chest pain at this time has not any chest pain for the past 2 days.  He denies fevers chills nausea vomiting shortness of breath.  Does have a history of CAD.Marland Kitchen   Patient's recorded medical, surgical, social, medication list and allergies were reviewed in the Snapshot window as part of the initial history.   Review of Systems   Review of Systems  Constitutional:  Positive for fatigue. Negative for chills and fever.  HENT:  Negative for ear pain and sore throat.   Eyes:  Negative for pain and visual disturbance.  Respiratory:  Negative for cough and shortness of breath.   Cardiovascular:  Positive for chest pain. Negative for palpitations.  Gastrointestinal:  Negative for abdominal pain and vomiting.  Genitourinary:  Negative for dysuria and hematuria.  Musculoskeletal:  Negative for arthralgias and back pain.  Skin:  Negative for color change and rash.  Neurological:  Negative for seizures and syncope.  All other systems reviewed and are negative.   Physical Exam Updated Vital Signs BP 133/72   Pulse (!) 53   Temp 98.5 F (36.9 C) (Oral)   Resp 16   Ht 6\' 1"  (1.854 m)   Wt 91.2 kg   SpO2 97%   BMI 26.52 kg/m  Physical Exam Vitals and nursing note reviewed.  Constitutional:      General: He is not in acute distress.    Appearance: He is well-developed.  HENT:     Head: Normocephalic and atraumatic.  Eyes:     Conjunctiva/sclera: Conjunctivae normal.  Cardiovascular:      Rate and Rhythm: Normal rate and regular rhythm.     Heart sounds: No murmur heard. Pulmonary:     Effort: Pulmonary effort is normal. No respiratory distress.     Breath sounds: Normal breath sounds.  Abdominal:     Palpations: Abdomen is soft.     Tenderness: There is no abdominal tenderness.  Musculoskeletal:        General: No swelling.     Cervical back: Neck supple.  Skin:    General: Skin is warm and dry.     Capillary Refill: Capillary refill takes less than 2 seconds.  Neurological:     Mental Status: He is alert.  Psychiatric:        Mood and Affect: Mood normal.      ED Course/ Medical Decision Making/ A&P Clinical Course as of 02/16/23 2348  Fri Feb 16, 2023  1800 N2C [CC]  1931 2nd troponin [CC]    Clinical Course User Index [CC] Glyn Ade, MD    Procedures Procedures   Medications Ordered in ED Medications - No data to display  Medical Decision Making:    Shane Shelton is a 77 y.o. male who presented to the ED today with chief complaint of intermittent chest pain currently not present.  Atypical description detailed above.     Additional history discussed with patient's family/caregivers.  Patient placed on continuous vitals and telemetry monitoring while in ED which was reviewed periodically.   Complete initial physical exam performed, notably the patient  was hemodynamically stable in no acute distress.  Currently asymptomatic.      Reviewed and confirmed nursing documentation for past medical history, family history, social history.    Initial Assessment:   With the patient's presentation of chest pain intermittently, most likely diagnosis is specific etiology such as musculoskeletal given his recent URI and frequent cough. Other diagnoses were considered including (but not limited to) ACS, pulmonary embolism, aortic dissection, pneumothorax, pneumonia. These are considered less likely due to history of present illness and  physical exam findings.   This is most consistent with an acute life/limb threatening illness complicated by underlying chronic conditions.  Initial Plan:   Screening labs including CBC and Metabolic panel to evaluate for infectious or metabolic etiology of disease.  CXR to evaluate for structural/infectious intrathoracic pathology.  Serial troponin/EKG to evaluate for cardiac pathology. Objective evaluation as below reviewed with plan for close reassessment  Initial Study Results:   Laboratory  All laboratory results reviewed without evidence of clinically relevant pathology.     EKG EKG was reviewed independently. Rate, rhythm, axis, intervals all examined and without medically relevant abnormality. ST segments without concerns for elevations.    Radiology  All images reviewed independently. Agree with radiology report at this time.   DG Chest 2 View  Result Date: 02/16/2023 CLINICAL DATA:  Chest pain EXAM: CHEST - 2 VIEW COMPARISON:  03/01/2021 FINDINGS: Emphysematous changes in the lungs. Linear atelectasis or fibrosis in the lung bases, similar to prior study. No airspace disease or consolidation. No pleural effusions. No pneumothorax. Mediastinal contours appear intact. Normal heart size and pulmonary vascularity. Tortuous aorta. Degenerative changes in the spine and shoulders. IMPRESSION: Emphysematous changes in the lungs with chronic atelectasis or scarring in the lung bases. No change. Electronically Signed   By: Burman Nieves M.D.   On: 02/16/2023 18:16     Reassessment and Plan:   Reassessed patient at bedside.  Symptoms grossly improving.  Shortness of breath resolving.  Had a long conversation with patient about ongoing care and management, given negative symptoms well appearance, negative troponins, lack of any active chest pain patient is stable for outpatient care and management. Strict return precautions reinforced but patient is already set up follow-up with cardiology  within 72 hours.  Patient discharged with no further acute events.   Disposition:  I have considered need for hospitalization, however, considering all of the above, I believe this patient is stable for discharge at this time.  Patient/family educated about specific return precautions for given chief complaint and symptoms.  Patient/family educated about follow-up with PCP.     Patient/family expressed understanding of return precautions and need for follow-up. Patient spoken to regarding all imaging and laboratory results and appropriate follow up for these results. All education provided in verbal form with additional information in written form. Time was allowed for answering of patient questions. Patient discharged.    Emergency Department Medication Summary:   Medications - No data to display   Clinical Impression:  1. Atypical chest pain      Discharge   Final Clinical Impression(s) / ED Diagnoses Final diagnoses:  Atypical chest pain    Rx / DC Orders ED Discharge Orders     None         Glyn Ade, MD 02/16/23 2348

## 2023-02-21 ENCOUNTER — Ambulatory Visit: Payer: Medicare Other | Admitting: Internal Medicine

## 2023-02-26 ENCOUNTER — Encounter: Payer: Self-pay | Admitting: Family Medicine

## 2023-02-27 ENCOUNTER — Other Ambulatory Visit: Payer: Self-pay

## 2023-02-27 ENCOUNTER — Encounter: Payer: Self-pay | Admitting: Family Medicine

## 2023-02-27 ENCOUNTER — Ambulatory Visit (INDEPENDENT_AMBULATORY_CARE_PROVIDER_SITE_OTHER): Payer: Medicare Other | Admitting: Family Medicine

## 2023-02-27 VITALS — BP 106/61 | HR 62 | Resp 16 | Ht 73.0 in | Wt 195.8 lb

## 2023-02-27 DIAGNOSIS — R197 Diarrhea, unspecified: Secondary | ICD-10-CM | POA: Diagnosis not present

## 2023-02-27 MED ORDER — ISOSORBIDE MONONITRATE ER 60 MG PO TB24: 60.0000 mg | ORAL_TABLET | Freq: Every evening | ORAL | 0 refills | Status: DC

## 2023-02-27 MED ORDER — ROSUVASTATIN CALCIUM 10 MG PO TABS: 10.0000 mg | ORAL_TABLET | Freq: Every day | ORAL | 0 refills | Status: DC

## 2023-02-27 MED ORDER — NITROGLYCERIN 0.4 MG SL SUBL: 0.4000 mg | SUBLINGUAL_TABLET | SUBLINGUAL | 0 refills | Status: DC | PRN

## 2023-02-27 NOTE — Patient Instructions (Addendum)
Good to see you today - Thank you for coming in  Things we discussed today:  Diarrhea - If not improving in 1-2 weeks or worsening or bleeding or fever or pain let us know Take imodium as needed   If you decide to stay with our practice make an appointment with Dr Linwood Dibbles for a physical

## 2023-02-27 NOTE — Progress Notes (Signed)
    SUBJECTIVE:   CHIEF COMPLAINT / HPI:   Diarrhea  From My Chart message 7/29 "I've had severe diarrhea with cramping, since last Monday. I assumed this was a case of Norovirus, since when I was in the emergency room on Friday July 19, the doctor who saw me warned me to wash my hands, since they were experiencing a large number of cases. Symptoms seemed to be getting somewhat better, I had stopped taking Imodiumyesterday, but I was again up several times over the course of last night. One other piece of information, is that we were in [place] for eight days, returning on the 18th, although we didn't eat any street food and only drank bottled water. Is there something that can be prescribed for symptoms. " Last diarrheal stool last night.  Took imodium this AM.  No abdomen pain or fever or rash or bleeding.  Feels is improving slightly - more formed.  Drinking some but "probably not enough"  Mild lightheadness no falls   Chest Pain Seen in ED 7/19.  Has cardiology appointment 8/16.  No longer having symptoms     OBJECTIVE:   BP 106/61   Pulse 62   Resp 16   Ht 6\' 1"  (1.854 m)   Wt 195 lb 12.8 oz (88.8 kg)   SpO2 96%   BMI 25.83 kg/m   Alert no distress Mobility:able to get up and down from exam table without assistance or distress Heart - Regular rate and rhythm.  No murmurs, gallops or rubs.    Lungs:  Normal respiratory effort, chest expands symmetrically. Lungs are clear to auscultation, no crackles or wheezes. Abdomen: soft and non-tender without masses, organomegaly or hernias noted.  No guarding or rebound Skin:  Intact without suspicious lesions or rashes   ASSESSMENT/PLAN:    Diarrhea - most consistent with viral infectious cause.  No signs of bacterial (bleeding or fever) or malabsorption.  So no need for antibiotics. Continue to treat symptomatically with imodium.  Encourage fluids.  If persists would need culture.      Patient Instructions  Good to see you  today - Thank you for coming in  Things we discussed today:  Diarrhea - If not improving in 1-2 weeks or worsening or bleeding or fever or pain let us know Take imodium as needed   If you decide to stay with our practice make an appointment with Dr Linwood Dibbles for a physical    Carney Living, MD [facility]

## 2023-03-15 NOTE — Progress Notes (Signed)
Cardiology Office Note   Date:  03/16/2023   ID:  Shane Shelton, Shane Shelton Shane Shelton, MRN [id number]  PCP:  Shane Laroche, DO    No chief complaint on file.  CAD  Wt Readings from Last 3 Encounters:  03/16/23 202 lb 3.2 oz (91.7 kg)  02/27/23 195 lb 12.8 oz (88.8 kg)  02/16/23 201 lb (91.2 kg)       History of Present Illness: Shane Shelton is a 77 y.o. male  former patient of Shane Shelton with a hx of hyperlipidemia, hypertension and bilateral LE claudication with negative doppler 01/2017. 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 with proximal RCA and RCA PL DES and moderate residual proximal LAD and distal RCA disease. Cath 09/2019 with moderate LAD disease best treated with medication. 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   In 2023: "He did develop vertigo after viral syndrome and was diagnosed with benign positional vertigo. He has lost nearly 30 pounds since seeing him last. He correlates this with increased physical activity and change in diet. "  Since the last visit with Shane Shelton, he had travelled to [place].  He had a URI and had an issue with elevation.  Also had diarrhea and there was a concern for norovirus.  [place] has felt well.    Went to the ER in July, had a negative w/u.  Went back to working out regularly at [employer]  90 minutes/day.    Denies : Chest pain. Leg edema. Nitroglycerin use. Orthopnea. Palpitations. Paroxysmal nocturnal dyspnea. Shortness of breath. Syncope.    Past Medical History:  Diagnosis Date   Allergy    hayfever   Cancer Shane Shelton)    Throat cancer 2011   Carotid artery occlusion    Coronary artery disease    Hyperlipidemia    Hypertension    Tubular adenoma of colon 11/25/2013    Past Surgical History:  Procedure Laterality Date   CARDIAC CATHETERIZATION  05/08/2017   S/P S/P PCI, DES of Mid RCA at [facility] in [place]   COLONOSCOPY     multiple   CORONARY PRESSURE/FFR STUDY N/A 10/09/2019   Procedure: INTRAVASCULAR PRESSURE WIRE/FFR STUDY;  Surgeon: Lyn Records, MD;  Location: [facility] INVASIVE CV LAB;  Service: Cardiovascular;  Laterality: N/A;   ESOPHAGOGASTRODUODENOSCOPY     multiple   ESOPHAGOSCOPY W/ PERCUTANEOUS GASTROSTOMY TUBE PLACEMENT  2011   LEFT HEART CATH AND CORONARY ANGIOGRAPHY N/A 10/09/2019   Procedure: LEFT HEART CATH AND CORONARY ANGIOGRAPHY;  Surgeon: Lyn Records, MD;  Location: [facility] INVASIVE CV LAB;  Service: Cardiovascular;  Laterality: N/A;     Current Outpatient Medications  Medication Sig Dispense Refill   acetaminophen (TYLENOL) 500 MG tablet Take 1,000 mg by mouth every 6 (six) hours as needed (for pain.).     aspirin EC 81 MG tablet Take 81 mg by mouth every evening.      clopidogrel (PLAVIX) 75 MG tablet Take 1 tablet (75 mg total) by mouth daily. 100 tablet 1   Coenzyme Q-10 200 MG CAPS Take 200 mg by mouth every evening.      Evolocumab (REPATHA SURECLICK) 140 MG/ML SOAJ INJECT 140MG  SUBCUTANEOUSLY EVERY 2 WEEKS 6 mL 3   isosorbide mononitrate (IMDUR) 60 MG 24 hr tablet Take 1 tablet (60 mg total) by mouth every evening. 30 tablet 0  losartan (COZAAR) 25 MG tablet Take 1 tablet (25 mg total) by mouth daily. 100 tablet 1   naproxen sodium (ALEVE) 220 MG tablet Take 220 mg by mouth 2 (two) times daily as needed (pain.).     nitroGLYCERIN (NITROSTAT) 0.4 MG SL tablet Place 1 tablet (0.4 mg total) under the tongue every 5 (five) minutes as needed for chest pain. 20 tablet 0   pantoprazole (PROTONIX) 40 MG tablet TAKE 1 TABLET BY MOUTH  DAILY AS NEEDED 90 tablet 3   rosuvastatin (CRESTOR) 10 MG tablet Take 1 tablet (10 mg total) by mouth daily. 30 tablet 0   No current facility-administered medications for this visit.    Allergies:   Ambien [zolpidem], Gabapentin, Beta adrenergic blockers, and Statins    Social History:  The patient  reports  that he quit smoking about 13 years ago. His smoking use included cigarettes. He started smoking about 53 years ago. He has a 40 pack-year smoking history. He has never used smokeless tobacco. He reports current alcohol use of about 7.0 standard drinks of alcohol per week. He reports that he does not use drugs.   Family History:  The patient's family history includes Cancer in his brother and father; Diabetes in his mother; Heart disease in his brother and mother; Hyperlipidemia in his brother and mother.    ROS:  Please see the history of present illness.   Otherwise, review of systems are positive for recent URI; orthostatic hypotension sx; intentional weight loss over the past few years with exercise.   All other systems are reviewed and negative.    PHYSICAL EXAM: VS:  BP 118/70   Pulse 66   Ht 6\' 1"  (1.854 m)   Wt 202 lb 3.2 oz (91.7 kg)   SpO2 96%   BMI 26.68 kg/m  , BMI Body mass index is 26.68 kg/m. GEN: Well nourished, well developed, in no acute distress HEENT: normal Neck: no JVD, carotid bruits, or masses Cardiac: RRR; no murmurs, rubs, or gallops,no edema  Respiratory:  clear to auscultation bilaterally, normal work of breathing GI: soft, nontender, nondistended, + BS MS: no deformity or atrophy Skin: warm and dry, no rash Neuro:  Strength and sensation are intact Psych: euthymic mood, full affect   EKG:   The ekg ordered today demonstrates NSR, inferior ST changes, negative troponin   Recent Labs: 02/16/2023: BUN 16; Creatinine, Ser 1.15; Hemoglobin 14.3; Platelets 240; Potassium 4.2; Sodium 138   Lipid Panel    Component Value Date/Time   CHOL 90 (L) 05/15/2022 1107   TRIG 59 05/15/2022 1107   HDL 58 05/15/2022 1107   CHOLHDL 1.6 05/15/2022 1107   LDLCALC 18 05/15/2022 1107   LDLDIRECT 100 (H) 04/04/2017 1438     Other studies Reviewed: Additional studies/ records that were reviewed today with results demonstrating: labs reviewed.   ASSESSMENT AND  PLAN:  CAD: No clear angina.  Plan for echo given recent sx and dizziness.  No further testing if his LVEF is normal. Carotid disease: Gets annual Dopplers. Hyperlipidemia: LDL 18 in 2023.  On repeatha. HTN: The current medical regimen is effective;  continue present plan and medications.  We discussed his sensations of lightheadedness.  He may try to cut his losartan in half. Former smoker: Quit in 2011 when he was diagnosed with throat cancer.   Current medicines are reviewed at length with the patient today.  The patient concerns regarding his medicines were addressed.  The following changes have been made:  No change  Labs/ tests ordered today include:  No orders of the defined types were placed in this encounter.   Recommend 150 minutes/week of aerobic exercise Low fat, low carb, high fiber diet recommended  Disposition:   FU in 1 year with Dr. Herbie Baltimore or another interventionalist   Signed, [name], MD  03/16/2023 11:30 AM    [facility] [address] [place], [place], [place]  [address] Phone: [phone]; Fax: [phone]

## 2023-03-16 ENCOUNTER — Encounter: Payer: Self-pay | Admitting: Interventional Cardiology

## 2023-03-16 ENCOUNTER — Ambulatory Visit: Payer: Medicare Other | Admitting: Interventional Cardiology

## 2023-03-16 VITALS — BP 118/70 | HR 66 | Ht 73.0 in | Wt 202.2 lb

## 2023-03-16 DIAGNOSIS — I6523 Occlusion and stenosis of bilateral carotid arteries: Secondary | ICD-10-CM | POA: Diagnosis not present

## 2023-03-16 DIAGNOSIS — Z87891 Personal history of nicotine dependence: Secondary | ICD-10-CM | POA: Diagnosis not present

## 2023-03-16 DIAGNOSIS — E7800 Pure hypercholesterolemia, unspecified: Secondary | ICD-10-CM | POA: Diagnosis not present

## 2023-03-16 DIAGNOSIS — I493 Ventricular premature depolarization: Secondary | ICD-10-CM

## 2023-03-16 DIAGNOSIS — I10 Essential (primary) hypertension: Secondary | ICD-10-CM

## 2023-03-16 DIAGNOSIS — I25119 Atherosclerotic heart disease of native coronary artery with unspecified angina pectoris: Secondary | ICD-10-CM

## 2023-03-16 NOTE — Patient Instructions (Signed)
Medication Instructions:  Your physician recommends that you continue on your current medications as directed. Please refer to the Current Medication list given to you today.  *If you need a refill on your cardiac medications before your next appointment, please call your pharmacy*   Lab Work: none If you have labs (blood work) drawn today and your tests are completely normal, you will receive your results only by: MyChart Message (if you have MyChart) OR A paper copy in the mail If you have any lab test that is abnormal or we need to change your treatment, we will call you to review the results.   Testing/Procedures: Your physician has requested that you have an echocardiogram. Echocardiography is a painless test that uses sound waves to create images of your heart. It provides your doctor with information about the size and shape of your heart and how well your heart's chambers and valves are working. This procedure takes approximately one hour. There are no restrictions for this procedure. Please do NOT wear cologne, perfume, aftershave, or lotions (deodorant is allowed). Please arrive 15 minutes prior to your appointment time.  Your physician has requested that you have a carotid duplex. This test is an ultrasound of the carotid arteries in your neck. It looks at blood flow through these arteries that supply the brain with blood. Allow one hour for this exam. There are no restrictions or special instructions. To be done in early December 2024   Follow-Up: At [facility], you and your health needs are our priority.  As part of our continuing mission to provide you with exceptional heart care, we have created designated Provider Care Teams.  These Care Teams include your primary Cardiologist (physician) and Advanced Practice Providers (APPs -  Physician Assistants and Nurse Practitioners) who all work together to provide you with the care you need, when you need it.  We  recommend signing up for the patient portal called "MyChart".  Sign up information is provided on this After Visit Summary.  MyChart is used to connect with patients for Virtual Visits (Telemedicine).  Patients are able to view lab/test results, encounter notes, upcoming appointments, etc.  Non-urgent messages can be sent to your provider as well.   To learn more about what you can do with MyChart, go to [email or web address].    Your next appointment:   12 month(s)  Provider:   Dr Clifton James, Dr Lynnette Caffey, Dr Herbie Baltimore     Other Instructions

## 2023-03-19 ENCOUNTER — Other Ambulatory Visit: Payer: Self-pay | Admitting: Interventional Cardiology

## 2023-03-28 ENCOUNTER — Other Ambulatory Visit: Payer: Self-pay | Admitting: Interventional Cardiology

## 2023-04-13 ENCOUNTER — Ambulatory Visit (HOSPITAL_COMMUNITY): Payer: Medicare Other | Attending: Interventional Cardiology

## 2023-04-13 DIAGNOSIS — I10 Essential (primary) hypertension: Secondary | ICD-10-CM | POA: Diagnosis not present

## 2023-04-13 DIAGNOSIS — I25119 Atherosclerotic heart disease of native coronary artery with unspecified angina pectoris: Secondary | ICD-10-CM | POA: Diagnosis not present

## 2023-04-13 DIAGNOSIS — I493 Ventricular premature depolarization: Secondary | ICD-10-CM

## 2023-04-13 LAB — ECHOCARDIOGRAM COMPLETE
Area-P 1/2: 3.17 cm2
S' Lateral: 2.9 cm

## 2023-04-23 ENCOUNTER — Encounter: Payer: Self-pay | Admitting: Family Medicine

## 2023-05-12 ENCOUNTER — Other Ambulatory Visit: Payer: Self-pay | Admitting: Interventional Cardiology

## 2023-05-21 ENCOUNTER — Ambulatory Visit (INDEPENDENT_AMBULATORY_CARE_PROVIDER_SITE_OTHER): Payer: Medicare Other | Admitting: Family Medicine

## 2023-05-21 ENCOUNTER — Encounter: Payer: Self-pay | Admitting: Family Medicine

## 2023-05-21 VITALS — BP 136/76 | HR 53 | Ht 73.0 in | Wt 206.8 lb

## 2023-05-21 DIAGNOSIS — R918 Other nonspecific abnormal finding of lung field: Secondary | ICD-10-CM

## 2023-05-21 DIAGNOSIS — Z0000 Encounter for general adult medical examination without abnormal findings: Secondary | ICD-10-CM

## 2023-05-21 DIAGNOSIS — Z87891 Personal history of nicotine dependence: Secondary | ICD-10-CM | POA: Diagnosis not present

## 2023-05-21 DIAGNOSIS — I10 Essential (primary) hypertension: Secondary | ICD-10-CM

## 2023-05-21 DIAGNOSIS — E7800 Pure hypercholesterolemia, unspecified: Secondary | ICD-10-CM | POA: Diagnosis not present

## 2023-05-21 DIAGNOSIS — R0789 Other chest pain: Secondary | ICD-10-CM | POA: Diagnosis not present

## 2023-05-21 DIAGNOSIS — I25119 Atherosclerotic heart disease of native coronary artery with unspecified angina pectoris: Secondary | ICD-10-CM

## 2023-05-21 DIAGNOSIS — Z85828 Personal history of other malignant neoplasm of skin: Secondary | ICD-10-CM | POA: Diagnosis not present

## 2023-05-21 DIAGNOSIS — R7303 Prediabetes: Secondary | ICD-10-CM

## 2023-05-21 NOTE — Progress Notes (Unsigned)
BP 136/76   Pulse (!) 53   Ht 6\' 1"  (1.854 m)   Wt 206 lb 12.8 oz (93.8 kg)   SpO2 97%   BMI 27.28 kg/m    Subjective:    Patient ID: Shane Shelton, male    DOB: 1946-04-09, 77 y.o.   MRN: [id number]  HPI: Shane Shelton is a 77 y.o. male presenting on 05/21/2023 for comprehensive medical examination. Current medical complaints include:{Blank single:19197::"none","***"}  Runs 20 mins no issues.   Hypertension, CAD: - Medications: ASA, plavix, losartan, crestor, imdur, NTG prn - Compliance: good. Used NTG in [place], didn't help.  - Checking BP at home: yes, SBP 130s - Denies any SOB, CP, vision changes, LE edema, medication SEs, or symptoms of hypotension - former smoker - Diet: *** - Exercise: ***  Throat cancer - pills stuck, produces saliva. Ever since radiation 2011. Dr. Molli Barrows.   He currently lives with: wife Interim Problems from his last visit: {Blank single:19197::"yes","no"}  Depression Screen done today and results listed below:     02/27/2023    9:23 AM 02/16/2023    3:13 PM 12/18/2022    9:48 AM 11/30/2022   11:11 AM 09/22/2022    1:34 PM  Depression screen PHQ 2/9  Decreased Interest 0 0 0 0 0  Down, Depressed, Hopeless 0 0 0 0 0  PHQ - 2 Score 0 0 0 0 0  Altered sleeping 0 0 0 0   Tired, decreased energy 0 0 0 0   Change in appetite 0 0 0 0   Feeling bad or failure about yourself  0 0 0 0   Trouble concentrating 0 0 0 0   Moving slowly or fidgety/restless 0 0 0 0   Suicidal thoughts 0 0 0 0   PHQ-9 Score 0 0 0 0   Difficult doing work/chores    Not difficult at all     The patient {has/does not have:19849} a history of falls. I {did/did not:19850} complete a risk assessment for falls. A plan of care for falls {was/was not:19852} documented.   Past Medical History:  Past Medical History:  Diagnosis Date   Allergy    hayfever   Cancer (HCC)    Throat cancer 2011   Carotid artery occlusion    Coronary artery disease     Hyperlipidemia    Hypertension    Tubular adenoma of colon 11/25/2013    Surgical History:  Past Surgical History:  Procedure Laterality Date   CARDIAC CATHETERIZATION  05/08/2017   S/P S/P PCI, DES of Mid RCA at [facility] in [place]   COLONOSCOPY     multiple   CORONARY PRESSURE/FFR STUDY N/A 10/09/2019   Procedure: INTRAVASCULAR PRESSURE WIRE/FFR STUDY;  Surgeon: Lyn Records, MD;  Location: [facility] INVASIVE CV LAB;  Service: Cardiovascular;  Laterality: N/A;   ESOPHAGOGASTRODUODENOSCOPY     multiple   ESOPHAGOSCOPY W/ PERCUTANEOUS GASTROSTOMY TUBE PLACEMENT  2011   LEFT HEART CATH AND CORONARY ANGIOGRAPHY N/A 10/09/2019   Procedure: LEFT HEART CATH AND CORONARY ANGIOGRAPHY;  Surgeon: Lyn Records, MD;  Location: [facility] INVASIVE CV LAB;  Service: Cardiovascular;  Laterality: N/A;    Medications:  Current Outpatient Medications on File Prior to Visit  Medication Sig   acetaminophen (TYLENOL) 500 MG tablet Take 1,000 mg by mouth every 6 (six) hours as needed (for pain.).   aspirin EC 81 MG tablet Take 81 mg by mouth every evening.    clopidogrel (PLAVIX) 75  MG tablet TAKE 1 TABLET BY MOUTH DAILY   Coenzyme Q-10 200 MG CAPS Take 200 mg by mouth every evening.    Evolocumab (REPATHA SURECLICK) 140 MG/ML SOAJ INJECT 140MG  SUBCUTANEOUSLY EVERY 2 WEEKS   isosorbide mononitrate (IMDUR) 60 MG 24 hr tablet TAKE 1 TABLET BY MOUTH IN THE  EVENING   losartan (COZAAR) 25 MG tablet TAKE 1 TABLET BY MOUTH DAILY   naproxen sodium (ALEVE) 220 MG tablet Take 220 mg by mouth 2 (two) times daily as needed (pain.).   nitroGLYCERIN (NITROSTAT) 0.4 MG SL tablet Place 1 tablet (0.4 mg total) under the tongue every 5 (five) minutes as needed for chest pain.   pantoprazole (PROTONIX) 40 MG tablet TAKE 1 TABLET BY MOUTH  DAILY AS NEEDED   rosuvastatin (CRESTOR) 10 MG tablet TAKE 1 TABLET BY MOUTH DAILY   No current facility-administered medications on file prior to visit.    Allergies:   Allergies  Allergen Reactions   Ambien [Zolpidem] Other (See Comments)    Sleepwalking   Gabapentin Other (See Comments)    Tingling/lightheaded   Beta Adrenergic Blockers     Patient had bradycardia with resting heart rate of 45 on metoprolol succinate 25 mg daily   Statins Other (See Comments)    Tingling and flatulence with atorvastatin and rosuvastatin.     Social History:  Social History   Socioeconomic History   Marital status: Married    Spouse name: Byrd Hesselbach   Number of children: Not on file   Years of education: Not on file   Highest education level: Bachelor's degree (e.g., BA, AB, BS)  Occupational History   Occupation: Geophysical data processor    Comment: Retired   Occupation: Retired  Tobacco Use   Smoking status: Former    Current packs/day: 0.00    Average packs/day: 1 pack/day for 40.0 years (40.0 ttl pk-yrs)    Types: Cigarettes    Start date: 10/05/1969    Quit date: 10/05/2009    Years since quitting: 13.6   Smokeless tobacco: Never  Vaping Use   Vaping status: Never Used  Substance and Sexual Activity   Alcohol use: Yes    Alcohol/week: 7.0 standard drinks of alcohol    Types: 7 Glasses of wine per week   Drug use: Never   Sexual activity: Yes    Comment: monagamous  Other Topics Concern   Not on file  Social History Narrative   Patient is married with 4 daughters   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   1 alcoholic beverage a day 1 caffeinated beverage daily former smoker no tobacco or drug use now   Social Determinants of Corporate investment banker Strain: Low Risk  (05/17/2023)   Overall Financial Resource Strain (CARDIA)    Difficulty of Paying Living Expenses: Not hard at all  Food Insecurity: No Food Insecurity (05/17/2023)   Hunger Vital Sign    Worried About Running Out of Food in the Last Year: Never true    Ran Out of Food in the Last  Year: Never true  Transportation Needs: No Transportation Needs (05/17/2023)   PRAPARE - Administrator, Civil Service (Medical): No    Lack of Transportation (Non-Medical): No  Physical Activity: Sufficiently Active (05/17/2023)   Exercise Vital Sign    Days of Exercise per Week: 5 days    Minutes of Exercise per  Session: 60 min  Stress: No Stress Concern Present (05/17/2023)   [employer] of Occupational Health - Occupational Stress Questionnaire    Feeling of Stress : Not at all  Social Connections: Moderately Integrated (05/17/2023)   Social Connection and Isolation Panel [NHANES]    Frequency of Communication with Friends and Family: Three times a week    Frequency of Social Gatherings with Friends and Family: Three times a week    Attends Religious Services: Never    Active Member of Clubs or Organizations: Yes    Attends Banker Meetings: More than 4 times per year    Marital Status: Married  Catering manager Violence: Not At Risk (09/22/2022)   Humiliation, Afraid, Rape, and Kick questionnaire    Fear of Current or Ex-Partner: No    Emotionally Abused: No    Physically Abused: No    Sexually Abused: No   Social History   Tobacco Use  Smoking Status Former   Current packs/day: 0.00   Average packs/day: 1 pack/day for 40.0 years (40.0 ttl pk-yrs)   Types: Cigarettes   Start date: 10/05/1969   Quit date: 10/05/2009   Years since quitting: 13.6  Smokeless Tobacco Never   Social History   Substance and Sexual Activity  Alcohol Use Yes   Alcohol/week: 7.0 standard drinks of alcohol   Types: 7 Glasses of wine per week    Family History:  Family History  Problem Relation Age of Onset   Heart disease Mother    Diabetes Mother    Hyperlipidemia Mother    Cancer Father    Heart disease Brother    Cancer Brother    Hyperlipidemia Brother    Colon cancer Neg Hx    Colon polyps Neg Hx    Esophageal cancer Neg Hx    Stomach cancer Neg Hx     Rectal cancer Neg Hx     Past medical history, surgical history, medications, allergies, family history and social history reviewed with patient today and changes made to appropriate areas of the chart.      Objective:    BP 136/76   Pulse (!) 53   Ht 6\' 1"  (1.854 m)   Wt 206 lb 12.8 oz (93.8 kg)   SpO2 97%   BMI 27.28 kg/m   Wt Readings from Last 3 Encounters:  05/21/23 206 lb 12.8 oz (93.8 kg)  03/16/23 202 lb 3.2 oz (91.7 kg)  02/27/23 195 lb 12.8 oz (88.8 kg)    Physical Exam  Results for orders placed or performed in visit on 04/13/23  ECHOCARDIOGRAM COMPLETE  Result Value Ref Range   Area-P 1/2 3.17 cm2   S' Lateral 2.90 cm   Est EF 65 - 70%       Assessment & Plan:   Problem List Items Addressed This Visit   None    LABORATORY TESTING:  Health maintenance labs ordered today as discussed above.   The natural history of prostate cancer and ongoing controversy regarding screening and potential treatment outcomes of prostate cancer has been discussed with the patient. The meaning of a false positive PSA and a false negative PSA has been discussed. He indicates understanding of the limitations of this screening test and wishes *** to proceed with screening PSA testing.   IMMUNIZATIONS:   - Tdap: Tetanus vaccination status reviewed: {tetanus status:315746}. - Influenza: Up to date - Pneumococcal: {Blank single:19197::"Up to date","Administered today","Not applicable","Refused","Given elsewhere"} - HPV: {Blank single:19197::"Up to date","Administered today","Not applicable","Refused","Given elsewhere"} -  Shingrix vaccine: {Blank single:19197::"Up to date","Administered today","Not applicable","Refused","Given elsewhere"} - COVID vaccine: ***  SCREENING: - Colonoscopy: {Blank single:19197::"Up to date","Ordered today","Not applicable","Refused","Done elsewhere"}  Discussed with patient purpose of the colonoscopy is to detect colon cancer at curable precancerous  or early stages   - AAA Screening: {Blank single:19197::"Up to date","Ordered today","Not applicable","Refused","Done elsewhere"}  - Lung cancer screening: ***  Hep C Screening: UTD STD testing and prevention (HIV/chl/gon/syphilis): *** Sexual History: Incontinence Symptoms:   PATIENT COUNSELING:    Advanced Care Planning: A voluntary discussion about advance care planning including the explanation and discussion of advance directives.  Discussed health care proxy and Living will, and the patient was able to identify a health care proxy as wife, Byrd Hesselbach.  Patient does have a living will at present time. If patient does have living will, I have requested they bring this to the clinic to be scanned in to their chart.  Sexuality: Discussed sexually transmitted diseases, partner selection, use of condoms, avoidance of unintended pregnancy  and contraceptive alternatives.   Advised to avoid cigarette smoking.  I discussed with the patient that most people either abstain from alcohol or drink within safe limits (<=14/week and <=4 drinks/occasion for males, <=7/weeks and <= 3 drinks/occasion for females) and that the risk for alcohol disorders and other health effects rises proportionally with the number of drinks per week and how often a drinker exceeds daily limits.  Discussed cessation/primary prevention of drug use and availability of treatment for abuse.   Diet: Encouraged to adjust caloric intake to maintain  or achieve ideal body weight, to reduce intake of dietary saturated fat and total fat, to limit sodium intake by avoiding high sodium foods and not adding table salt, and to maintain adequate dietary potassium and calcium preferably from fresh fruits, vegetables, and low-fat dairy products.    stressed the importance of regular exercise  Injury prevention: Discussed safety belts, safety helmets, smoke detector, smoking near bedding or upholstery.   Dental health: Discussed importance of  regular tooth brushing, flossing, and dental visits.   Follow up plan: NEXT PREVENTATIVE PHYSICAL DUE IN 1 YEAR. No follow-ups on file.

## 2023-05-21 NOTE — Patient Instructions (Addendum)
It was great to see you!  Our plans for today:  - We are getting an ultrasound of your abdominal aorta to screen for aneurysm.  - We are getting a follow up CT of your lung nodules.  - Get your shingles and tetanus vaccines at your pharmacy.  We are checking some labs today, we will release these results to your MyChart.  Take care and seek immediate care sooner if you develop any concerns.   Dr. Linwood Dibbles   Preventive Care 74 Years and Older, Male Preventive care refers to lifestyle choices and visits with your health care provider that can promote health and wellness. Preventive care visits are also called wellness exams. What can I expect for my preventive care visit? Counseling During your preventive care visit, your health care provider may ask about your: Medical history, including: Past medical problems. Family medical history. History of falls. Current health, including: Emotional well-being. Home life and relationship well-being. Sexual activity. Memory and ability to understand (cognition). Lifestyle, including: Alcohol, nicotine or tobacco, and drug use. Access to firearms. Diet, exercise, and sleep habits. Work and work Astronomer. Sunscreen use. Safety issues such as seatbelt and bike helmet use. Physical exam Your health care provider will check your: Height and weight. These may be used to calculate your BMI (body mass index). BMI is a measurement that tells if you are at a healthy weight. Waist circumference. This measures the distance around your waistline. This measurement also tells if you are at a healthy weight and may help predict your risk of certain diseases, such as type 2 diabetes and high blood pressure. Heart rate and blood pressure. Body temperature. Skin for abnormal spots. What immunizations do I need?  Vaccines are usually given at various ages, according to a schedule. Your health care provider will recommend vaccines for you based on your  age, medical history, and lifestyle or other factors, such as travel or where you work. What tests do I need? Screening Your health care provider may recommend screening tests for certain conditions. This may include: Lipid and cholesterol levels. Diabetes screening. This is done by checking your blood sugar (glucose) after you have not eaten for a while (fasting). Hepatitis C test. Hepatitis B test. HIV (human immunodeficiency virus) test. STI (sexually transmitted infection) testing, if you are at risk. Lung cancer screening. Colorectal cancer screening. Prostate cancer screening. Abdominal aortic aneurysm (AAA) screening. You may need this if you are a current or former smoker. Talk with your health care provider about your test results, treatment options, and if necessary, the need for more tests. Follow these instructions at home: Eating and drinking  Eat a diet that includes fresh fruits and vegetables, whole grains, lean protein, and low-fat dairy products. Limit your intake of foods with high amounts of sugar, saturated fats, and salt. Take vitamin and mineral supplements as recommended by your health care provider. Do not drink alcohol if your health care provider tells you not to drink. If you drink alcohol: Limit how much you have to 0-2 drinks a day. Know how much alcohol is in your drink. In the [place] one drink equals one 12 oz bottle of beer (355 mL), one 5 oz glass of wine (148 mL), or one 1 oz glass of hard liquor (44 mL). Lifestyle Brush your teeth every morning and night with fluoride toothpaste. Floss one time each day. Exercise for at least 30 minutes 5 or more days each week. Do not use any products that contain nicotine  or tobacco. These products include cigarettes, chewing tobacco, and vaping devices, such as e-cigarettes. If you need help quitting, ask your health care provider. Do not use drugs. If you are sexually active, practice safe sex. Use a condom or  other form of protection to prevent STIs. Take aspirin only as told by your health care provider. Make sure that you understand how much to take and what form to take. Work with your health care provider to find out whether it is safe and beneficial for you to take aspirin daily. Ask your health care provider if you need to take a cholesterol-lowering medicine (statin). Find healthy ways to manage stress, such as: Meditation, yoga, or listening to music. Journaling. Talking to a trusted person. Spending time with friends and family. Safety Always wear your seat belt while driving or riding in a vehicle. Do not drive: If you have been drinking alcohol. Do not ride with someone who has been drinking. When you are tired or distracted. While texting. If you have been using any mind-altering substances or drugs. Wear a helmet and other protective equipment during sports activities. If you have firearms in your house, make sure you follow all gun safety procedures. Minimize exposure to UV radiation to reduce your risk of skin cancer. What's next? Visit your health care provider once a year for an annual wellness visit. Ask your health care provider how often you should have your eyes and teeth checked. Stay up to date on all vaccines. This information is not intended to replace advice given to you by your health care provider. Make sure you discuss any questions you have with your health care provider. Document Revised: 01/12/2021 Document Reviewed: 01/12/2021 Elsevier Patient Education  2024 [employer].

## 2023-05-22 ENCOUNTER — Encounter: Payer: Self-pay | Admitting: Family Medicine

## 2023-05-22 LAB — MICROALBUMIN / CREATININE URINE RATIO
Creatinine, Urine: 41.3 mg/dL
Microalb/Creat Ratio: <7 mg/g{creat} (ref 0–29)
Microalbumin, Urine: <3 ug/mL

## 2023-05-22 LAB — HEMOGLOBIN A1C
Est. average glucose Bld gHb Est-mCnc: 134 mg/dL
Hgb A1c MFr Bld: 6.3 % — ABNORMAL HIGH (ref 4.8–5.6)

## 2023-05-22 NOTE — Assessment & Plan Note (Signed)
Noted while traveling in [place] earlier this summer. Reviewed notes from outpatient visit and subsequent ED visit at that time with normal troponins and EKG without significant change and f/u ECHO with normal function. Currently asymptomatic and without further events. Continue to monitor and follow with Cardiology.

## 2023-05-22 NOTE — Assessment & Plan Note (Signed)
Due for annual screening, ordered.

## 2023-05-22 NOTE — Assessment & Plan Note (Signed)
Continues to follow with Derm.

## 2023-05-22 NOTE — Assessment & Plan Note (Signed)
Remains on DAPT. Follows with Cardiology with upcoming appointment, continue. Encouraged to inform cardiologist of event while traveling. Currently asymptomatic.

## 2023-05-22 NOTE — Assessment & Plan Note (Signed)
Last A1c in diabetic range. Recheck A1c today. Obtaining urine microalbumin. Foot exam normal, see flowsheet for details.

## 2023-05-22 NOTE — Assessment & Plan Note (Signed)
Initially elevated, improved on recheck. Follows with cardiology. No changes.

## 2023-05-22 NOTE — Assessment & Plan Note (Signed)
Tolerant of statin and repatha, continue.

## 2023-06-06 ENCOUNTER — Ambulatory Visit
Admission: RE | Admit: 2023-06-06 | Discharge: 2023-06-06 | Disposition: A | Discharge status: Home / Self Care | Payer: Medicare Other | Source: Ambulatory Visit | Attending: Family Medicine | Admitting: Family Medicine

## 2023-06-06 ENCOUNTER — Other Ambulatory Visit: Payer: Self-pay

## 2023-06-06 DIAGNOSIS — R918 Other nonspecific abnormal finding of lung field: Secondary | ICD-10-CM

## 2023-06-06 DIAGNOSIS — Z87891 Personal history of nicotine dependence: Secondary | ICD-10-CM | POA: Diagnosis not present

## 2023-06-06 MED ORDER — PANTOPRAZOLE SODIUM 40 MG PO TBEC: 40.0000 mg | DELAYED_RELEASE_TABLET | Freq: Every day | ORAL | 3 refills | Status: DC | PRN

## 2023-07-02 ENCOUNTER — Encounter (INDEPENDENT_AMBULATORY_CARE_PROVIDER_SITE_OTHER): Payer: Medicare Other | Admitting: Interventional Cardiology

## 2023-07-02 ENCOUNTER — Ambulatory Visit (HOSPITAL_COMMUNITY)
Admission: RE | Admit: 2023-07-02 | Discharge: 2023-07-02 | Disposition: A | Discharge status: Home / Self Care | Payer: Medicare Other | Source: Ambulatory Visit | Attending: Cardiology | Admitting: Cardiology

## 2023-07-02 DIAGNOSIS — I6523 Occlusion and stenosis of bilateral carotid arteries: Secondary | ICD-10-CM | POA: Insufficient documentation

## 2023-07-02 DIAGNOSIS — I6521 Occlusion and stenosis of right carotid artery: Secondary | ICD-10-CM

## 2023-07-03 ENCOUNTER — Telehealth: Payer: Self-pay | Admitting: Internal Medicine

## 2023-07-03 NOTE — Telephone Encounter (Signed)
Patient states he recently had testing that showed significant stenosis up to 99% and he would like a call back to discuss.

## 2023-07-03 NOTE — Telephone Encounter (Signed)
Mr. Ruston, a 77 year old with a history of coronary artery disease, carotid artery and subclavian stenosis, and peripheral arterial disease, was previously under the care of Dr. Katrinka Blazing and Dr. Eldridge Dace. In August 2024, He was asymptomatic from a cardiac standpoint.  Despite the progression of carotid artery stenosis, the patient reported no symptoms suggestive of an active coronary problem such as chest pain or shortness of breath.   Severe Right Carotid Artery Stenosis - Confirmed severe stenosis on the right side via carotid duplex on July 02, 2023. No current coronary artery disease symptoms precluding intervention  Though he is moderate risk for cardiovascular events peri-operatively he is well compensated and asymptomatic.. - He is not prohibitive for [facility] intervention - results of his testing was forwarded to his vascular surgeon, in addition he has called to discuss procedural evaluation - he would like this done so, ideally, he can either make his 07/30/2023 to [place] or that he can make appropriate travel plans  Coronary Artery Disease - Monitor for new symptoms  I have offered to establish care with him.  Please arrange post procedural follow up with me or my team. Patient had no further questions.  Riley Lam, MD [facility] Cardiologist [facility]  [address] [place], [address] [place], [place] [address] [phone]  9:55 AM

## 2023-07-03 NOTE — Telephone Encounter (Signed)
Called pt to advise a Provider has not reviewed results.  Pt demanded that an MD review ASAP.  Expresses this is his life and he is advocating for himself.  Reports previously saw Dr. Randie Heinz with [facility].  Would like MD to be made aware.   Advised pt I have noted change is right Carotid Artery Stenosis from 60-79% 06/28/22 to 80-99% during yesterday's testing.   Pt again demands action be taken ASAP.  I again advised pt I will send concern to MD and route results to Dr. Lemar Livings so that he is aware of change.  Pt reports continues to take clopidogrel, Repatha, and rosuvastatin as ordered.

## 2023-07-05 ENCOUNTER — Encounter: Payer: Self-pay | Admitting: Family Medicine

## 2023-07-05 NOTE — Telephone Encounter (Signed)
Please see the MyChart message reply(ies) for my assessment and plan.    This patient gave consent for this Medical Advice Message and is aware that it may result in a bill to [employer], as well as the possibility of receiving a bill for a co-payment or deductible. They are an established patient, but are not seeking medical advice exclusively about a problem treated during an in person or video visit in the last seven days. I did not recommend an in person or video visit within seven days of my reply.    I spent a total of 15 minutes cumulative time within 7 days through [employer].  Werner Lean, MD

## 2023-07-09 DIAGNOSIS — H31092 Other chorioretinal scars, left eye: Secondary | ICD-10-CM | POA: Diagnosis not present

## 2023-07-09 DIAGNOSIS — H43391 Other vitreous opacities, right eye: Secondary | ICD-10-CM | POA: Diagnosis not present

## 2023-07-09 DIAGNOSIS — H2513 Age-related nuclear cataract, bilateral: Secondary | ICD-10-CM | POA: Diagnosis not present

## 2023-07-09 DIAGNOSIS — H11441 Conjunctival cysts, right eye: Secondary | ICD-10-CM | POA: Diagnosis not present

## 2023-07-11 ENCOUNTER — Ambulatory Visit (INDEPENDENT_AMBULATORY_CARE_PROVIDER_SITE_OTHER): Payer: Medicare Other | Admitting: Vascular Surgery

## 2023-07-11 DIAGNOSIS — I6521 Occlusion and stenosis of right carotid artery: Secondary | ICD-10-CM | POA: Diagnosis not present

## 2023-07-11 NOTE — Progress Notes (Signed)
Virtual Visit via Telephone Note   I connected with Shane Shelton on 07/11/2023 using the [employer] by telephone and verified that I was speaking with the correct person using two identifiers. Patient was located at at home and accompanied by his wife. I am located at the office alone.   The limitations of evaluation and management by telemedicine and the availability of in person appointments have been previously discussed with the patient and are documented in the patients chart. The patient expressed understanding and consented to proceed.  PCP: [name], DO  Chief Complaint: Carotid stenosis  History of Present Illness: Shane Shelton is a 77 y.o. male with past history of smoking and also has a history of throat cancer for which he underwent radiation and quit smoking at that time.  He has had an increase in carotid stenosis over the the most recent duplex and [place].  Patient is taking aspirin, Plavix, low-dose Crestor and Repatha.  He denies any previous history of stroke, TIA or amaurosis.  Past Medical History:  Diagnosis Date   Abdominal pain 12/23/2021   Allergy    hayfever   Cancer (HCC)    Throat cancer 2011   Carotid artery occlusion    Coronary artery disease    Hyperlipidemia    Hypertension    Tubular adenoma of colon 11/25/2013   Weight loss 12/23/2021    Past Surgical History:  Procedure Laterality Date   CARDIAC CATHETERIZATION  05/08/2017   S/P S/P PCI, DES of Mid RCA at [facility] in [place]   COLONOSCOPY     multiple   CORONARY PRESSURE/FFR STUDY N/A 10/09/2019   Procedure: INTRAVASCULAR PRESSURE WIRE/FFR STUDY;  Surgeon: Lyn Records, MD;  Location: [facility] INVASIVE CV LAB;  Service: Cardiovascular;  Laterality: N/A;   ESOPHAGOGASTRODUODENOSCOPY     multiple   ESOPHAGOSCOPY W/ PERCUTANEOUS GASTROSTOMY TUBE PLACEMENT  2011   LEFT  HEART CATH AND CORONARY ANGIOGRAPHY N/A 10/09/2019   Procedure: LEFT HEART CATH AND CORONARY ANGIOGRAPHY;  Surgeon: Lyn Records, MD;  Location: [facility] INVASIVE CV LAB;  Service: Cardiovascular;  Laterality: N/A;    No outpatient medications have been marked as taking for the 07/11/23 encounter (Appointment) with Maeola Harman, MD.    12 system ROS was negative unless otherwise noted in HPI   Observations/Objective:  Patient demonstrates very good understanding of our conversation today  Right Carotid Findings:  +----------+--------+--------+--------+------------------+-----------------  -+           PSV cm/sEDV cm/sStenosisPlaque DescriptionComments             +----------+--------+--------+--------+------------------+-----------------  -+  CCA Prox  68      14                                                     +----------+--------+--------+--------+------------------+-----------------  -+  CCA Distal42      7                                 intimal  thickening  +----------+--------+--------+--------+------------------+-----------------  -+  ICA Prox  434     164     80-99%  smooth                                 +----------+--------+--------+--------+------------------+-----------------  -+  ICA Mid   202     52                                                     +----------+--------+--------+--------+------------------+-----------------  -+  ICA Distal95      13                                                     +----------+--------+--------+--------+------------------+-----------------  -+  ECA      57      7                                                      +----------+--------+--------+--------+------------------+-----------------  -+   +----------+--------+-------+--------+-------------------+           PSV cm/sEDV cmsDescribeArm Pressure (mmHG)   +----------+--------+-------+--------+-------------------+  [id number]                   110                  +----------+--------+-------+--------+-------------------+    Right vertebral artery not well seen.   Left Carotid Findings:  +----------+--------+--------+--------+------------------+--------+           PSV cm/sEDV cm/sStenosisPlaque DescriptionComments  +----------+--------+--------+--------+------------------+--------+  CCA Prox  107     26                                          +----------+--------+--------+--------+------------------+--------+  CCA Distal89      20              smooth                      +----------+--------+--------+--------+------------------+--------+  ICA Prox  80      23      1-39%   smooth                      +----------+--------+--------+--------+------------------+--------+  ICA Mid   80      19                                          +----------+--------+--------+--------+------------------+--------+  ICA Distal92      27                                          +----------+--------+--------+--------+------------------+--------+  ECA      59      12              smooth                      +----------+--------+--------+--------+------------------+--------+   +----------+--------+--------+---------+-------------------+           PSV cm/sEDV  cm/sDescribe Arm Pressure (mmHG)  +----------+--------+--------+---------+-------------------+  Subclavian214            Turbulent108                  +----------+--------+--------+---------+-------------------+   +---------+--------+--+--------+--+---------+  VertebralPSV cm/s57EDV cm/s15Antegrade  +---------+--------+--+--------+--+---------+         Summary:  Right Carotid: Velocities in the right ICA are consistent with a 80-99%                 stenosis.   Left Carotid: Velocities in the left ICA are consistent  with a 1-39%  stenosis.   Vertebrals: Left vertebral artery demonstrates antegrade flow. Right not  well              seen, possibly occluded.  Subclavians: Normal flow hemodynamics were seen in bilateral subclavian               arteries.   Assessment and Plan: 77 year old male with now high-grade right ICA stenosis which does have some appearance of a smooth plaque consistent with previous radiation but also more than likely also has calcific disease given history of smoking.  Patient is scheduled for a 15-day cruise and given that treatment of asymptomatic carotid stenosis is to reduce the risk of stroke over a 5-year timeframe, I have recommended he keep his travel plans and follow-up afterwards.  We did discuss the signs and symptoms of stroke for which she demonstrated understanding.  Will obtain CTA and discuss stenting versus carotid endarterectomy after his trip.  Follow Up Instructions:   Follow up  6 to 7 weeks with CTA head and neck   I discussed the assessment and treatment plan with the patient. The patient was provided an opportunity to ask questions and all were answered. The patient agreed with the plan and demonstrated an understanding of the instructions.   The patient was advised to call back or seek an in-person evaluation if the symptoms worsen or if the condition fails to improve as anticipated.  I spent 16 minutes with the patient via telephone encounter.   Signed, Lemar Livings [facility] Office: [phone]  07/11/2023, 4:30 PM

## 2023-07-20 MED ORDER — LOSARTAN POTASSIUM 50 MG PO TABS: 50.0000 mg | ORAL_TABLET | Freq: Every day | ORAL | 3 refills | Status: DC

## 2023-07-20 NOTE — Addendum Note (Signed)
Addended by: Frutoso Schatz on: 07/20/2023 03:52 PM   Modules accepted: Orders

## 2023-07-20 NOTE — Telephone Encounter (Signed)
Called patient in regards to his MyChart message. Patient states that yesterday afternoon he started to feel off. He reports feeling nauseous and "out of sorts" with some slight dizziness. He took his blood pressure and it was 145/98. He went ahead and took 1 tablet of losartan (25 mg). Blood pressure lowered to 134/61 which is typical for him. He states that he took his normal dose of losartan that evening as well. He states that when he first woke up this morning he felt fine but around 10 am he started to feel off again. His BP was 176/95 so he took a tablet of losartan. BP came down to 154/73. Most recent reading was 151/88. He still feels a bit off. He does take isosorbide in the evenings as well. Patient recently seen by vascular surgery due to carotid artery stenosis. Patient previously seen by Dr. Eldridge Dace. He has not reestablished care with a new provider but he would prefer to see Dr. Allyson Sabal. He is wanting to know if he can adjust his losartan to 50 mg per day and if he can be seen soon for evaluation. He is going on a cruise towards the end of the year and wants to make sure his pressure is under control before he leaves.

## 2023-07-22 ENCOUNTER — Encounter: Payer: Self-pay | Admitting: Interventional Cardiology

## 2023-07-23 ENCOUNTER — Encounter (HOSPITAL_COMMUNITY): Payer: Self-pay

## 2023-07-23 ENCOUNTER — Emergency Department (HOSPITAL_COMMUNITY): Payer: Medicare Other

## 2023-07-23 ENCOUNTER — Other Ambulatory Visit: Payer: Self-pay

## 2023-07-23 ENCOUNTER — Emergency Department (HOSPITAL_COMMUNITY)
Admission: EM | Admit: 2023-07-23 | Discharge: 2023-07-23 | Disposition: A | Discharge status: Home / Self Care | Payer: Medicare Other | Attending: Emergency Medicine | Admitting: Emergency Medicine

## 2023-07-23 DIAGNOSIS — Z7982 Long term (current) use of aspirin: Secondary | ICD-10-CM | POA: Diagnosis not present

## 2023-07-23 DIAGNOSIS — I2510 Atherosclerotic heart disease of native coronary artery without angina pectoris: Secondary | ICD-10-CM | POA: Diagnosis not present

## 2023-07-23 DIAGNOSIS — R079 Chest pain, unspecified: Secondary | ICD-10-CM | POA: Diagnosis not present

## 2023-07-23 DIAGNOSIS — Z79899 Other long term (current) drug therapy: Secondary | ICD-10-CM | POA: Diagnosis not present

## 2023-07-23 DIAGNOSIS — R0789 Other chest pain: Secondary | ICD-10-CM | POA: Insufficient documentation

## 2023-07-23 DIAGNOSIS — I10 Essential (primary) hypertension: Secondary | ICD-10-CM | POA: Insufficient documentation

## 2023-07-23 DIAGNOSIS — Z7902 Long term (current) use of antithrombotics/antiplatelets: Secondary | ICD-10-CM | POA: Insufficient documentation

## 2023-07-23 DIAGNOSIS — R6889 Other general symptoms and signs: Secondary | ICD-10-CM

## 2023-07-23 LAB — BASIC METABOLIC PANEL
Anion gap: 8 (ref 5–15)
BUN: 14 mg/dL (ref 8–23)
CO2: 23 mmol/L (ref 22–32)
Calcium: 9.3 mg/dL (ref 8.9–10.3)
Chloride: 103 mmol/L (ref 98–111)
Creatinine, Ser: 1.16 mg/dL (ref 0.61–1.24)
GFR, Estimated: >60 mL/min (ref >60)
Glucose, Bld: 190 mg/dL — ABNORMAL HIGH (ref 70–99)
Potassium: 3.8 mmol/L (ref 3.5–5.1)
Sodium: 134 mmol/L — ABNORMAL LOW (ref 135–145)

## 2023-07-23 LAB — TROPONIN I (HIGH SENSITIVITY): Troponin I (High Sensitivity): 3 ng/L (ref <18)

## 2023-07-23 LAB — CBC
HCT: 44 % (ref 39.0–52.0)
Hemoglobin: 14.8 g/dL (ref 13.0–17.0)
MCH: 30.6 pg (ref 26.0–34.0)
MCHC: 33.6 g/dL (ref 30.0–36.0)
MCV: 90.9 fL (ref 80.0–100.0)
Platelets: 229 10*3/uL (ref 150–400)
RBC: 4.84 MIL/uL (ref 4.22–5.81)
RDW: 12.6 % (ref 11.5–15.5)
WBC: 4.5 10*3/uL (ref 4.0–10.5)
nRBC: 0 % (ref 0.0–0.2)

## 2023-07-23 NOTE — ED Provider Triage Note (Signed)
Emergency Medicine Provider Triage Evaluation Note  Shane Shelton , a 77 y.o. male  was evaluated in triage.  Pt complains of chest discomfort  Review of Systems  Positive: High bp Negative: Fever or cough  Physical Exam  BP 138/77 (BP Location: Right Arm)   Pulse 65   Temp 98.1 F (36.7 C)   Resp 16   Ht 6\' 1"  (1.854 m)   Wt 93.8 kg   SpO2 96%   BMI 27.28 kg/m  Gen:   Awake, no distress   Resp:  Normal effort  MSK:   Moves extremities without difficulty  Other:    Medical Decision Making  Medically screening exam initiated at 1:11 PM.  Appropriate orders placed.  Raynelle Dick Stennett was informed that the remainder of the evaluation will be completed by another provider, this initial triage assessment does not replace that evaluation, and the importance of remaining in the ED until their evaluation is complete.     Elson Areas, [place] 07/23/23 1311

## 2023-07-23 NOTE — ED Provider Notes (Signed)
[place] EMERGENCY DEPARTMENT AT [facility] Provider Note   CSN: [id number] Arrival date & time: 07/23/23  1117     History  Chief Complaint  Patient presents with   Hypertension    Donielle Heckel is a 77 y.o. male.  Patient here for due to some blood pressure issues.  He recently had his losartan increased to 50 mg daily.  Has been taking this now for couple days.  He is taking Imdur at the same time as well at night.  He has a history of CAD.  History of high cholesterol hypertension.  Discussed some carotid vascular disease that needs to get addressed at some point as well.  He is just not feeling well at times here recently.  He denies any active chest pain shortness of breath weakness numbness tingling.  No vision loss.  He feels like his blood pressure is really been fluctuating and making him not feel well.  Patient works out daily and does not really get chest discomfort from it.  He denies any recent surgery or travel.  He is not having any chest pain at this time.  In concern today is about his blood pressure being up and down.  The history is provided by the patient.       Home Medications Prior to Admission medications   Medication Sig Start Date End Date Taking? Authorizing Provider  acetaminophen (TYLENOL) 500 MG tablet Take 1,000 mg by mouth every 6 (six) hours as needed (for pain.).    [provider]  aspirin EC 81 MG tablet Take 81 mg by mouth every evening.     [provider]  clopidogrel (PLAVIX) 75 MG tablet TAKE 1 TABLET BY MOUTH DAILY 05/15/23   Corky Crafts, MD  Coenzyme Q-10 200 MG CAPS Take 200 mg by mouth every evening.     [provider]  Evolocumab (REPATHA SURECLICK) 140 MG/ML SOAJ INJECT 140MG  SUBCUTANEOUSLY EVERY 2 WEEKS 01/02/23   Corky Crafts, MD  isosorbide mononitrate (IMDUR) 60 MG 24 hr tablet TAKE 1 TABLET BY MOUTH IN THE  EVENING 03/19/23   Corky Crafts, MD  losartan  (COZAAR) 50 MG tablet Take 1 tablet (50 mg total) by mouth daily. 07/20/23 10/18/23  Runell Gess, MD  naproxen sodium (ALEVE) 220 MG tablet Take 220 mg by mouth 2 (two) times daily as needed (pain.).    [provider]  nitroGLYCERIN (NITROSTAT) 0.4 MG SL tablet Place 1 tablet (0.4 mg total) under the tongue every 5 (five) minutes as needed for chest pain. 02/27/23 05/28/23  Corky Crafts, MD  pantoprazole (PROTONIX) 40 MG tablet Take 1 tablet (40 mg total) by mouth daily as needed. 06/06/23   [name], DO  rosuvastatin (CRESTOR) 10 MG tablet TAKE 1 TABLET BY MOUTH DAILY 03/28/23   Corky Crafts, MD      Allergies    Ambien [zolpidem], Gabapentin, Beta adrenergic blockers, and Statins    Review of Systems   Review of Systems  Physical Exam Updated Vital Signs BP 138/77 (BP Location: Right Arm)   Pulse 65   Temp 98.1 F (36.7 C)   Resp 16   Ht 6\' 1"  (1.854 m)   Wt 93.8 kg   SpO2 96%   BMI 27.28 kg/m  Physical Exam Vitals and nursing note reviewed.  Constitutional:      General: He is not in acute distress.    Appearance: He is well-developed. He is  not ill-appearing.  HENT:     Head: Normocephalic and atraumatic.     Nose: Nose normal.     Mouth/Throat:     Mouth: Mucous membranes are moist.  Eyes:     Extraocular Movements: Extraocular movements intact.     Conjunctiva/sclera: Conjunctivae normal.     Pupils: Pupils are equal, round, and reactive to light.  Cardiovascular:     Rate and Rhythm: Normal rate and regular rhythm.     Pulses: Normal pulses.     Heart sounds: Normal heart sounds. No murmur heard. Pulmonary:     Effort: Pulmonary effort is normal. No respiratory distress.     Breath sounds: Normal breath sounds.  Abdominal:     Palpations: Abdomen is soft.     Tenderness: There is no abdominal tenderness.  Musculoskeletal:        General: No swelling. Normal range of motion.     Cervical back: Normal range of motion and  neck supple. No tenderness.     Right lower leg: No edema.     Left lower leg: No edema.  Skin:    General: Skin is warm and dry.     Capillary Refill: Capillary refill takes less than 2 seconds.  Neurological:     General: No focal deficit present.     Mental Status: He is alert and oriented to person, place, and time.     Cranial Nerves: No cranial nerve deficit.     Sensory: No sensory deficit.     Motor: No weakness.     Coordination: Coordination normal.     Comments: 5+ out of 5 strength throughout, normal sensation, no drift, normal finger-to-nose finger, normal speech, normal coordination  Psychiatric:        Mood and Affect: Mood normal.     ED Results / Procedures / Treatments   Labs (all labs ordered are listed, but only abnormal results are displayed) Labs Reviewed  BASIC METABOLIC PANEL - Abnormal; Notable for the following components:      Result Value   Sodium 134 (*)    Glucose, Bld 190 (*)    All other components within normal limits  CBC  TROPONIN I (HIGH SENSITIVITY)    EKG EKG Interpretation Date/Time:  Monday July 23 2023 11:22:50 EST Ventricular Rate:  60 PR Interval:  164 QRS Duration:  90 QT Interval:  408 QTC Calculation: 408 R Axis:   79  Text Interpretation: Normal sinus rhythm Normal ECG When compared with ECG of 16-Feb-2023 17:36, PREVIOUS ECG IS PRESENT Confirmed by Virgina Norfolk [phone]) on 07/23/2023 1:57:45 PM  Radiology DG Chest 2 View Result Date: 07/23/2023 CLINICAL DATA:  Chest pain. EXAM: CHEST - 2 VIEW COMPARISON:  02/16/2023. FINDINGS: Bilateral lung fields are clear. Bilateral costophrenic angles are clear. Normal cardio-mediastinal silhouette. No acute osseous abnormalities. The soft tissues are within normal limits. IMPRESSION: *No active cardiopulmonary disease. Electronically Signed   By: Jules Schick M.D.   On: 07/23/2023 12:08    Procedures Procedures    Medications Ordered in ED Medications - No data to  display  ED Course/ Medical Decision Making/ A&P                                 Medical Decision Making Amount and/or Complexity of Data Reviewed Labs: ordered. Radiology: ordered.   Raynelle Dick Dugger is here with blood pressure concerns.  Unremarkable vitals.  Blood pressure  here 138/77.  Orthostatics unremarkable.  Blood pressure goes from 138 systolic to 155 systolic when he stands.  He has been having some lightheaded, and feeling well episodes.  He just had his losartan increased 3 days ago to 50 mg.  He has been taking this at night with his Imdur.  He has not had any stroke symptoms.  Is not having any chest pain.  Sometimes will have some discomfort throughout but he works out daily does not have chest pain with it.  He also states it does not feel like any anginal symptoms.  He has had stents before.  He had a heart catheterization done a couple years ago.  Overall he is not having any stroke symptoms.  He is not having chest pain.  He supposed to have his carotids likely intervened on soon by vascular.  Ultimately has a normal neurological exam.  He has reassuring history and physical.  He had an EKG done that showed sinus rhythm.  No ischemic changes.  We did CBC BMP troponin which were unremarkable per my review and interpretation of the labs.  There is no significant anemia or electrolyte abnormality or kidney injury or leukocytosis.  Troponin is normal.  Chest x-ray shows no evidence of pneumonia or pneumothorax.  Overall I do not think he has ACS or PE or stroke or other acute process.  I think blood pressure has been labile.  He may be adjusting to his losartan.  I did recommend that maybe he take his losartan in the morning and his ender in the afternoon to help avoid big changes in his blood pressure throughout the day.  At this time I gave him reassurance.  He understand strict return precautions.  Sounds like he has a follow-up with his primary care doctor for blood pressure  recheck before he goes on a cruise next week.  He is concerned about this trip.  I think that he will be fine and that he should enjoy the trip but he understands to return if he develops stroke symptoms chest pain or other concerning symptoms.  But at this time I think he is clear for discharge to home.  This chart was dictated using voice recognition software.  Despite best efforts to proofread,  errors can occur which can change the documentation meaning.         Final Clinical Impression(s) / ED Diagnoses Final diagnoses:  Atypical chest pain  Blood pressure alteration    Rx / DC Orders ED Discharge Orders     None         Virgina Norfolk, DO 07/23/23 1442

## 2023-07-23 NOTE — Discharge Instructions (Signed)
Continue to chart your blood pressure twice a day.  Please return if you develop the worst headache of your life, worsening chest pain or other concerns as we discussed.

## 2023-07-23 NOTE — ED Triage Notes (Signed)
Pt c/o issues w/bp going up and down all week. Pt states losartan was increased to 50 mg bp dropped and hr went up to over 90 the first day he took the increased dose. Pt c/o left sided chest tightness and nausea, dull Hax2d.

## 2023-07-23 NOTE — Telephone Encounter (Signed)
Called pt to set up an appointment. Pt is on the way to the ED currently. He will call back to set up an appointment.

## 2023-07-26 ENCOUNTER — Ambulatory Visit (INDEPENDENT_AMBULATORY_CARE_PROVIDER_SITE_OTHER): Payer: Medicare Other | Admitting: Student

## 2023-07-26 ENCOUNTER — Other Ambulatory Visit: Payer: Self-pay

## 2023-07-26 ENCOUNTER — Encounter: Payer: Self-pay | Admitting: Student

## 2023-07-26 ENCOUNTER — Ambulatory Visit: Payer: Medicare Other | Admitting: Family Medicine

## 2023-07-26 VITALS — BP 121/69 | HR 57 | Ht 74.0 in | Wt 209.8 lb

## 2023-07-26 DIAGNOSIS — I10 Essential (primary) hypertension: Secondary | ICD-10-CM | POA: Diagnosis not present

## 2023-07-26 DIAGNOSIS — I6521 Occlusion and stenosis of right carotid artery: Secondary | ICD-10-CM

## 2023-07-26 MED ORDER — LOSARTAN POTASSIUM 50 MG PO TABS: 25.0000 mg | ORAL_TABLET | Freq: Every day | ORAL | Status: DC

## 2023-07-26 NOTE — Patient Instructions (Addendum)
It was great to see you today! Thank you for choosing [facility] for your primary care.  Today we addressed: Hypertension: We used some shared decision making and agreed to go back to your previous dosage of losartan 25 mg.  Please continue to take your Imdur as prescribed.  Please follow-up when you return from your cruise should you want to further discuss your blood pressure management.  For your interest, I have attached my standardized blood pressure record sheet for patients who choose to track their blood pressure.  It also has instructions on how to take a blood pressure. Separately, I will pass along your thoughts of your medical care to Dr. Linwood Dibbles.  I appreciate you sharing those with me and the and logical conversation we had together.  If you haven't already, sign up for My Chart to have easy access to your labs results, and communication with your primary care physician.  Please arrive 15 minutes before your appointment to ensure smooth check in process.  We appreciate your efforts in making this happen.  Thank you for allowing me to participate in your care, Shelby Mattocks, DO 07/26/2023, 4:36 PM PGY-3, [facility]   Blood Pressure Record Sheet To take your blood pressure, you will need a blood pressure machine. You can buy a blood pressure machine (blood pressure monitor) at your clinic, drug store, or online. When choosing one, consider: An automatic monitor that has an arm cuff. A cuff that wraps snugly around your upper arm. You should be able to fit only one finger between your arm and the cuff. A device that stores blood pressure reading results. Do not choose a monitor that measures your blood pressure from your wrist or finger. Follow your health care provider's instructions for how to take your blood pressure. To use this form: Take your blood pressure medications every day These measurements should be taken when you have been at rest for at  least 10-15 min Take at least 2 readings with each blood pressure check. This makes sure the results are correct. Wait 1-2 minutes between measurements. Write down the results in the spaces on this form. Keep in mind it should always be recorded systolic over diastolic. Both numbers are important.  Repeat this every day for 2-3 weeks, or as told by your health care provider.  Make a follow-up appointment with your health care provider to discuss the results.  Blood Pressure Log Date Medications taken? (Y/N) Blood Pressure Time of Day

## 2023-07-26 NOTE — Progress Notes (Deleted)
    SUBJECTIVE:   CHIEF COMPLAINT / HPI: HTN  Went to ED on 12/23 with concern regarding his blood pressure. Was instructed to take losartan in AM and Imdur in PM. Otherwise had normal work-up.  PERTINENT  PMH / PSH: CAD, HTN, LVH  OBJECTIVE:   There were no vitals taken for this visit.  ***  ASSESSMENT/PLAN:   Assessment & Plan  No follow-ups on file.  Celine Mans, MD [facility]

## 2023-07-26 NOTE — Progress Notes (Signed)
  SUBJECTIVE:   CHIEF COMPLAINT / HPI:   Went to ED on 12/23 with concern regarding his blood pressure. Was instructed to take losartan in AM and Imdur in PM. Otherwise had normal work-up. Notes his BP on 12/20 was "way up" meaning around 180s/90s. He was told increase losartan to 50mg . He notes that felt him uncomfortable. AM of 12/24 he got lightheaded and his BP was 90/37.   PERTINENT  PMH / PSH: HLD, HTN, prediabetes, BPPV, GERD, CAD s/p stenting  OBJECTIVE:  BP 121/69   Pulse (!) 57   Ht 6\' 2"  (1.88 m)   Wt 209 lb 12.8 oz (95.2 kg)   SpO2 97%   BMI 26.94 kg/m  Gen: well-appearing, NAD CV: RRR, no murmurs auscultated Pulm: normal WOB  ASSESSMENT/PLAN:   Assessment & Plan Essential hypertension BP: 121/69 today. Well controlled.  Reviewed prior lab work, stable kidney function.  Goal of avoiding hypotensive events. Discussed obtaining blood pressure arm cuff. Blood pressure log provided. Medication regimen: continue Imdur 60mg  daily, reduce Losartan to 25mg  daily. Continue to monitor BP regularly, recommended follow up when he returns from cruise.  He also has appropriate follow-up with cardiology.  Shelby Mattocks, DO 07/27/2023, 7:15 PM PGY-3, [facility]

## 2023-07-27 NOTE — Assessment & Plan Note (Addendum)
BP: 121/69 today. Well controlled.  Reviewed prior lab work, stable kidney function.  Goal of avoiding hypotensive events. Discussed obtaining blood pressure arm cuff. Blood pressure log provided. Medication regimen: continue Imdur 60mg  daily, reduce Losartan to 25mg  daily. Continue to monitor BP regularly, recommended follow up when he returns from cruise.  He also has appropriate follow-up with cardiology.

## 2023-07-31 ENCOUNTER — Ambulatory Visit: Payer: Medicare Other | Admitting: Cardiovascular Disease

## 2023-08-18 ENCOUNTER — Encounter: Payer: Self-pay | Admitting: Family Medicine

## 2023-08-22 ENCOUNTER — Ambulatory Visit
Admission: RE | Admit: 2023-08-22 | Discharge: 2023-08-22 | Disposition: A | Discharge status: Home / Self Care | Payer: Medicare Other | Source: Ambulatory Visit | Attending: Vascular Surgery

## 2023-08-22 ENCOUNTER — Other Ambulatory Visit: Payer: Self-pay | Admitting: Vascular Surgery

## 2023-08-22 ENCOUNTER — Ambulatory Visit
Admission: RE | Admit: 2023-08-22 | Discharge: 2023-08-22 | Disposition: A | Discharge status: Home / Self Care | Payer: Medicare Other | Source: Ambulatory Visit | Attending: Vascular Surgery | Admitting: Vascular Surgery

## 2023-08-22 DIAGNOSIS — I6521 Occlusion and stenosis of right carotid artery: Secondary | ICD-10-CM | POA: Diagnosis not present

## 2023-08-22 MED ORDER — IOPAMIDOL (ISOVUE-370) INJECTION 76%
75.0000 mL | Freq: Once | INTRAVENOUS | Status: AC | PRN
  Administered 2023-08-22: 75 mL via INTRAVENOUS

## 2023-08-26 NOTE — Progress Notes (Unsigned)
Cardiology Office Note:  .   Date:  08/28/2023  ID:  Shane Shelton, DOB 03-17-1946, MRN [id number] PCP: [name], DO  [facility] Providers Cardiologist:  [name], MD { History of Present Illness: Marland Kitchen   Shane Shelton is a 78 y.o. male with history of CAD, carotid artery disease, HTN, HLD who presents for follow-up.    History of Present Illness   The patient is a 78 year old male with coronary artery disease and carotid artery disease who presents with dizziness. He is accompanied by his wife. He was followed by Dr. Eldridge Dace for his cardiac conditions.  He experiences dizziness, which he attributes to an episode of low blood pressure after increasing his losartan dose to 50 mg. His blood pressure dropped to 90/37, prompting a reduction in losartan to 25 mg daily, which he now takes in the morning. He also takes isosorbide in the evening. Since adjusting the medication, he feels stable with no further dizziness. No current dizziness, chest pain at rest, significant arm discomfort, sudden loss of vision, weakness, or signs of stroke. Occasional headaches are noted.  He has a history of coronary artery disease with diffuse CAD and has been managed medically. He underwent a heart catheterization in 2021, resulting in the placement of two stents on the right side. He has an 80% occlusion in a small artery at the back of the heart, which was not surgically addressed. He experiences angina during exercise, described as 'a little bit down the arm,' but it resolves mid-exercise. He exercises five days a week, including three cardio days, and can walk up to 3.5 miles. No chest pain at rest.  He has significant carotid artery disease with 80-99% stenosis in the right internal carotid artery. He has been followed by vascular surgery for this condition. He expresses concern about the lack of proactive management from his healthcare team regarding this issue.  He has  a history of throat cancer, which was treated with radiation on the right side. He is prediabetic and has a family history of heart disease, with a brother who experienced cardiac arrest and underwent a quadruple bypass. He quit smoking after his cancer diagnosis and drinks a glass of wine with dinner. He is retired from a career in Manufacturing engineer and has four daughters.          Problem List CAD -PCI RCA 2018 -LHC 2021: 70% LAD (DFR 0.93), 75% dLAD, 50% RCA, 80% PLV 2. Carotid artery disease  -80-99% RICA 07/2023 3. HLD -T chol 90, HDL 58, LDL 18, TG 59 4. HTN 5. Pre DM -A1c 6.3    ROS: All other ROS reviewed and negative. Pertinent positives noted in the HPI.     Studies Reviewed: Marland Kitchen   EKG Interpretation Date/Time:  Tuesday August 28 2023 14:33:23 EST Ventricular Rate:  67 PR Interval:  180 QRS Duration:  92 QT Interval:  396 QTC Calculation: 418 R Axis:   75  Text Interpretation: Normal sinus rhythm Normal ECG Confirmed by Lennie Odor [phone]) on 08/28/2023 2:34:57 PM    TTE 04/13/2023  1. Left ventricular ejection fraction, by estimation, is 65 to 70%. Left  ventricular ejection fraction by PLAX is 69 %. The left ventricle has  normal function. The left ventricle has no regional wall motion  abnormalities. Left ventricular diastolic  parameters are consistent with Grade I diastolic dysfunction (impaired  relaxation).   2. Right ventricular systolic function is normal. The right ventricular  size is normal. Tricuspid regurgitation signal is inadequate for assessing  PA pressure.   3. Left atrial size was mildly dilated.   4. The mitral valve is grossly normal. Trivial mitral valve  regurgitation.   5. The aortic valve is tricuspid. Aortic valve regurgitation is not  visualized.   6. The inferior vena cava is normal in size with greater than 50%  respiratory variability, suggesting right atrial pressure of 3 mmHg.  Physical Exam:   VS:  BP 116/68 (BP  Location: Left Arm, Patient Position: Sitting, Cuff Size: Normal)   Pulse 67   Ht 6\' 2"  (1.88 m)   Wt 209 lb (94.8 kg)   BMI 26.83 kg/m    Wt Readings from Last 3 Encounters:  08/28/23 209 lb (94.8 kg)  07/26/23 209 lb 12.8 oz (95.2 kg)  07/23/23 206 lb 12.7 oz (93.8 kg)    GEN: Well nourished, well developed in no acute distress NECK: No JVD; +R carotid bruit CARDIAC: RRR, no murmurs, rubs, gallops RESPIRATORY:  Clear to auscultation without rales, wheezing or rhonchi  ABDOMEN: Soft, non-tender, non-distended EXTREMITIES:  No edema; No deformity  ASSESSMENT AND PLAN: .   Assessment and Plan    Carotid Artery Disease 80-99% right internal carotid artery stenosis. Discussed the high risk of stroke with this level of stenosis and the need for surgical intervention. -Recommend proceeding with surgery. Will send a message to vascular surgery to discuss.  Coronary Artery Disease Diffuse CAD managed medically. Reports angina with exertion but can walk up to 3.5 miles. No angina at rest. -Continue current medical management. -on ASA and plavix. On repatha and imdur -infrequent angina that is managed well medically   Hypertension Previously experienced dizziness due to overmedication. Now well controlled on Losartan 25mg  daily. -Continue Losartan 25mg  daily. imdur 60 mg daily.   Hyperlipidemia LDL at goal (18). -Continue current lipid-lowering therapy.  Follow-up in 6 months.              Follow-up: Return in about 6 months (around 02/25/2024).   Signed, Lenna Gilford. Flora Lipps, MD, [facility]  [facility]  [address] [place], [place] [address] [phone]  3:12 PM

## 2023-08-28 ENCOUNTER — Ambulatory Visit: Payer: Medicare Other | Attending: Cardiovascular Disease | Admitting: Cardiovascular Disease

## 2023-08-28 ENCOUNTER — Encounter: Payer: Self-pay | Admitting: Cardiovascular Disease

## 2023-08-28 VITALS — BP 116/68 | HR 67 | Ht 74.0 in | Wt 209.0 lb

## 2023-08-28 DIAGNOSIS — E7800 Pure hypercholesterolemia, unspecified: Secondary | ICD-10-CM | POA: Diagnosis not present

## 2023-08-28 DIAGNOSIS — I6521 Occlusion and stenosis of right carotid artery: Secondary | ICD-10-CM | POA: Diagnosis not present

## 2023-08-28 DIAGNOSIS — I25119 Atherosclerotic heart disease of native coronary artery with unspecified angina pectoris: Secondary | ICD-10-CM | POA: Diagnosis not present

## 2023-08-28 DIAGNOSIS — Z01810 Encounter for preprocedural cardiovascular examination: Secondary | ICD-10-CM | POA: Diagnosis not present

## 2023-08-28 DIAGNOSIS — R42 Dizziness and giddiness: Secondary | ICD-10-CM

## 2023-08-28 DIAGNOSIS — I10 Essential (primary) hypertension: Secondary | ICD-10-CM

## 2023-08-28 NOTE — Patient Instructions (Signed)
Medication Instructions:  Your physician recommends that you continue on your current medications as directed. Please refer to the Current Medication list given to you today.    *If you need a refill on your cardiac medications before your next appointment, please call your pharmacy*   Lab Work: None    If you have labs (blood work) drawn today and your tests are completely normal, you will receive your results only by: MyChart Message (if you have MyChart) OR A paper copy in the mail If you have any lab test that is abnormal or we need to change your treatment, we will call you to review the results.   Testing/Procedures: None    Follow-Up: At [facility], you and your health needs are our priority.  As part of our continuing mission to provide you with exceptional heart care, we have created designated Provider Care Teams.  These Care Teams include your primary Cardiologist (physician) and Advanced Practice Providers (APPs -  Physician Assistants and Nurse Practitioners) who all work together to provide you with the care you need, when you need it.  We recommend signing up for the patient portal called "MyChart".  Sign up information is provided on this After Visit Summary.  MyChart is used to connect with patients for Virtual Visits (Telemedicine).  Patients are able to view lab/test results, encounter notes, upcoming appointments, etc.  Non-urgent messages can be sent to your provider as well.   To learn more about what you can do with MyChart, go to [email or web address].    Your next appointment:   6 month(s)  The format for your next appointment:   In Person  Provider:   Lennie Odor, MD     Other Instructions

## 2023-08-29 ENCOUNTER — Encounter: Payer: Self-pay | Admitting: Vascular Surgery

## 2023-08-29 ENCOUNTER — Ambulatory Visit: Payer: Medicare Other | Admitting: Vascular Surgery

## 2023-08-29 VITALS — BP 131/85 | HR 68 | Temp 98.2°F | Resp 20 | Ht 74.0 in | Wt 206.0 lb

## 2023-08-29 DIAGNOSIS — I6521 Occlusion and stenosis of right carotid artery: Secondary | ICD-10-CM

## 2023-08-29 NOTE — H&P (View-Only) (Signed)
Patient ID: Shane Shelton, male   DOB: October 02, 1945, 78 y.o.   MRN: [id number]  Reason for Consult: Follow-up   Referred by [name], DO  Subjective:     HPI:  Shane Shelton is a 78 y.o. male with history of known right carotid artery stenosis and history of throat cancer for which she underwent radiation and quit smoking at that time.  He is taking aspirin, Plavix and Repatha and has been for a long time.  No history of stroke, TIA or amaurosis.  He is now here for follow-up with CTA after returning from his Viking cruise to [place].  Past Medical History:  Diagnosis Date   Abdominal pain 12/23/2021   Allergy    hayfever   Cancer (HCC)    Throat cancer 2011   Carotid artery occlusion    Coronary artery disease    Hyperlipidemia    Hypertension    Tubular adenoma of colon 11/25/2013   Weight loss 12/23/2021   Family History  Problem Relation Age of Onset   Heart disease Mother    Diabetes Mother    Hyperlipidemia Mother    Cancer Father    Heart disease Brother    Cancer Brother    Hyperlipidemia Brother    Colon cancer Neg Hx    Colon polyps Neg Hx    Esophageal cancer Neg Hx    Stomach cancer Neg Hx    Rectal cancer Neg Hx    Past Surgical History:  Procedure Laterality Date   CARDIAC CATHETERIZATION  05/08/2017   S/P S/P PCI, DES of Mid RCA at [facility] in [place]   COLONOSCOPY     multiple   CORONARY PRESSURE/FFR STUDY N/A 10/09/2019   Procedure: INTRAVASCULAR PRESSURE WIRE/FFR STUDY;  Surgeon: Lyn Records, MD;  Location: [facility] INVASIVE CV LAB;  Service: Cardiovascular;  Laterality: N/A;   ESOPHAGOGASTRODUODENOSCOPY     multiple   ESOPHAGOSCOPY W/ PERCUTANEOUS GASTROSTOMY TUBE PLACEMENT  2011   LEFT HEART CATH AND CORONARY ANGIOGRAPHY N/A 10/09/2019   Procedure: LEFT HEART CATH AND CORONARY ANGIOGRAPHY;  Surgeon: Lyn Records, MD;  Location: [facility] INVASIVE CV LAB;  Service: Cardiovascular;  Laterality: N/A;    Short  Social History:  Social History   Tobacco Use   Smoking status: Former    Current packs/day: 0.00    Average packs/day: 1 pack/day for 40.0 years (40.0 ttl pk-yrs)    Types: Cigarettes    Start date: 10/05/1969    Quit date: 10/05/2009    Years since quitting: 13.9   Smokeless tobacco: Never  Substance Use Topics   Alcohol use: Yes    Alcohol/week: 7.0 standard drinks of alcohol    Types: 7 Glasses of wine per week    Allergies  Allergen Reactions   Ambien [Zolpidem] Other (See Comments)    Sleepwalking   Gabapentin Other (See Comments)    Tingling/lightheaded   Beta Adrenergic Blockers     Patient had bradycardia with resting heart rate of 45 on metoprolol succinate 25 mg daily   Statins Other (See Comments)    Tingling and flatulence with atorvastatin and rosuvastatin.     Current Outpatient Medications  Medication Sig Dispense Refill   acetaminophen (TYLENOL) 500 MG tablet Take 1,000 mg by mouth every 6 (six) hours as needed (for pain.).     aspirin EC 81 MG tablet Take 81 mg by mouth every evening.      clopidogrel (PLAVIX) 75 MG tablet TAKE  1 TABLET BY MOUTH DAILY 90 tablet 2   Coenzyme Q-10 200 MG CAPS Take 200 mg by mouth every evening.      Evolocumab (REPATHA SURECLICK) 140 MG/ML SOAJ INJECT 140MG  SUBCUTANEOUSLY EVERY 2 WEEKS 6 mL 3   isosorbide mononitrate (IMDUR) 60 MG 24 hr tablet TAKE 1 TABLET BY MOUTH IN THE  EVENING 30 tablet 11   losartan (COZAAR) 25 MG tablet Take 25 mg by mouth daily.     naproxen sodium (ALEVE) 220 MG tablet Take 220 mg by mouth 2 (two) times daily as needed (pain.).     pantoprazole (PROTONIX) 40 MG tablet Take 1 tablet (40 mg total) by mouth daily as needed. 90 tablet 3   rosuvastatin (CRESTOR) 10 MG tablet TAKE 1 TABLET BY MOUTH DAILY 90 tablet 3   nitroGLYCERIN (NITROSTAT) 0.4 MG SL tablet Place 1 tablet (0.4 mg total) under the tongue every 5 (five) minutes as needed for chest pain. 20 tablet 0   No current facility-administered  medications for this visit.    Review of Systems  Constitutional:  Constitutional negative. HENT: HENT negative.  Eyes: Eyes negative.  Respiratory: Respiratory negative.  Cardiovascular: Cardiovascular negative.  GI: Gastrointestinal negative.  Musculoskeletal: Musculoskeletal negative.  Skin: Skin negative.  Neurological: Neurological negative. Hematologic: Hematologic/lymphatic negative.  Psychiatric: Psychiatric negative.        Objective:  Objective   Vitals:   08/29/23 1027 08/29/23 1029  BP: 118/76 131/85  Pulse: 68   Resp: 20   Temp: 98.2 F (36.8 C)   SpO2: 94%   Weight: 206 lb (93.4 kg)   Height: 6\' 2"  (1.88 m)    Body mass index is 26.45 kg/m.  Physical Exam HENT:     Head: Normocephalic.     Nose: Nose normal.  Eyes:     Pupils: Pupils are equal, round, and reactive to light.  Neck:     Vascular: Carotid bruit present.  Cardiovascular:     Rate and Rhythm: Normal rate.  Pulmonary:     Effort: Pulmonary effort is normal.  Abdominal:     General: Abdomen is flat.  Skin:    General: Skin is warm.     Capillary Refill: Capillary refill takes less than 2 seconds.  Neurological:     General: No focal deficit present.     Mental Status: He is alert.  Psychiatric:        Mood and Affect: Mood normal.        Thought Content: Thought content normal.        Judgment: Judgment normal.     Data: Right Carotid Findings:  +----------+--------+--------+--------+------------------+-----------------  -+           PSV cm/sEDV cm/sStenosisPlaque DescriptionComments             +----------+--------+--------+--------+------------------+-----------------  -+  CCA Prox  68      14                                                     +----------+--------+--------+--------+------------------+-----------------  -+  CCA Distal42      7                                 intimal  thickening   +----------+--------+--------+--------+------------------+-----------------  -+  ICA  Prox  434     164     80-99%  smooth                                 +----------+--------+--------+--------+------------------+-----------------  -+  ICA Mid   202     52                                                     +----------+--------+--------+--------+------------------+-----------------  -+  ICA Distal95      13                                                     +----------+--------+--------+--------+------------------+-----------------  -+  ECA      57      7                                                      +----------+--------+--------+--------+------------------+-----------------  -+   +----------+--------+-------+--------+-------------------+           PSV cm/sEDV cmsDescribeArm Pressure (mmHG)  +----------+--------+-------+--------+-------------------+  [id number]                   110                  +----------+--------+-------+--------+-------------------+    Right vertebral artery not well seen.   Left Carotid Findings:  +----------+--------+--------+--------+------------------+--------+           PSV cm/sEDV cm/sStenosisPlaque DescriptionComments  +----------+--------+--------+--------+------------------+--------+  CCA Prox  107     26                                          +----------+--------+--------+--------+------------------+--------+  CCA Distal89      20              smooth                      +----------+--------+--------+--------+------------------+--------+  ICA Prox  80      23      1-39%   smooth                      +----------+--------+--------+--------+------------------+--------+  ICA Mid   80      19                                          +----------+--------+--------+--------+------------------+--------+  ICA Distal92      27                                           +----------+--------+--------+--------+------------------+--------+  ECA      59      12  smooth                      +----------+--------+--------+--------+------------------+--------+   +----------+--------+--------+---------+-------------------+           PSV cm/sEDV cm/sDescribe Arm Pressure (mmHG)  +----------+--------+--------+---------+-------------------+  Subclavian214            Turbulent108                  +----------+--------+--------+---------+-------------------+   +---------+--------+--+--------+--+---------+  VertebralPSV cm/s57EDV cm/s15Antegrade  +---------+--------+--+--------+--+---------+         Summary:  Right Carotid: Velocities in the right ICA are consistent with a 80-99%                 stenosis.   Left Carotid: Velocities in the left ICA are consistent with a 1-39%  stenosis.   Vertebrals: Left vertebral artery demonstrates antegrade flow. Right not  well              seen, possibly occluded.  Subclavians: Normal flow hemodynamics were seen in bilateral subclavian               arteries.    CTA reviewed with the patient and there are no results      Assessment/Plan:    78 year old male with history of right neck radiation and now high-grade right ICA stenosis confirmed with CTA.  We reviewed his images together today and discussed his options being continued medical therapy stent via transcarotid or transfemoral route versus carotid endarterectomy.  Given his history of radiation we have discussed proceeding with transcarotid artery stenting and we discussed the risk and benefits including 1 to 2% risk of stroke, the need for cutdown on his right sided neck and access of his left common femoral vein and the possible need for conversion to right carotid endarterectomy.  The patient demonstrates good understanding of all this will continue aspirin, Plavix and statin we will get him scheduled in the near  future.     Maeola Harman MD [facility]

## 2023-08-29 NOTE — Progress Notes (Signed)
Patient ID: Shane Shelton, male   DOB: 1946-02-02, 78 y.o.   MRN: [id number]  Reason for Consult: Follow-up   Referred by [name], DO  Subjective:     HPI:  Shane Shelton is a 78 y.o. male with history of known right carotid artery stenosis and history of throat cancer for which she underwent radiation and quit smoking at that time.  He is taking aspirin, Plavix and Repatha and has been for a long time.  No history of stroke, TIA or amaurosis.  He is now here for follow-up with CTA after returning from his [place] cruise to [place].  Past Medical History:  Diagnosis Date   Abdominal pain 12/23/2021   Allergy    hayfever   Cancer (HCC)    Throat cancer 2011   Carotid artery occlusion    Coronary artery disease    Hyperlipidemia    Hypertension    Tubular adenoma of colon 11/25/2013   Weight loss 12/23/2021   Family History  Problem Relation Age of Onset   Heart disease Mother    Diabetes Mother    Hyperlipidemia Mother    Cancer Father    Heart disease Brother    Cancer Brother    Hyperlipidemia Brother    Colon cancer Neg Hx    Colon polyps Neg Hx    Esophageal cancer Neg Hx    Stomach cancer Neg Hx    Rectal cancer Neg Hx    Past Surgical History:  Procedure Laterality Date   CARDIAC CATHETERIZATION  05/08/2017   S/P S/P PCI, DES of Mid RCA at [facility] in [place]   COLONOSCOPY     multiple   CORONARY PRESSURE/FFR STUDY N/A 10/09/2019   Procedure: INTRAVASCULAR PRESSURE WIRE/FFR STUDY;  Surgeon: Lyn Records, MD;  Location: [facility] INVASIVE CV LAB;  Service: Cardiovascular;  Laterality: N/A;   ESOPHAGOGASTRODUODENOSCOPY     multiple   ESOPHAGOSCOPY W/ PERCUTANEOUS GASTROSTOMY TUBE PLACEMENT  2011   LEFT HEART CATH AND CORONARY ANGIOGRAPHY N/A 10/09/2019   Procedure: LEFT HEART CATH AND CORONARY ANGIOGRAPHY;  Surgeon: Lyn Records, MD;  Location: [facility] INVASIVE CV LAB;  Service: Cardiovascular;  Laterality: N/A;    Short  Social History:  Social History   Tobacco Use   Smoking status: Former    Current packs/day: 0.00    Average packs/day: 1 pack/day for 40.0 years (40.0 ttl pk-yrs)    Types: Cigarettes    Start date: 10/05/1969    Quit date: 10/05/2009    Years since quitting: 13.9   Smokeless tobacco: Never  Substance Use Topics   Alcohol use: Yes    Alcohol/week: 7.0 standard drinks of alcohol    Types: 7 Glasses of wine per week    Allergies  Allergen Reactions   Ambien [Zolpidem] Other (See Comments)    Sleepwalking   Gabapentin Other (See Comments)    Tingling/lightheaded   Beta Adrenergic Blockers     Patient had bradycardia with resting heart rate of 45 on metoprolol succinate 25 mg daily   Statins Other (See Comments)    Tingling and flatulence with atorvastatin and rosuvastatin.     Current Outpatient Medications  Medication Sig Dispense Refill   acetaminophen (TYLENOL) 500 MG tablet Take 1,000 mg by mouth every 6 (six) hours as needed (for pain.).     aspirin EC 81 MG tablet Take 81 mg by mouth every evening.      clopidogrel (PLAVIX) 75 MG tablet TAKE  1 TABLET BY MOUTH DAILY 90 tablet 2   Coenzyme Q-10 200 MG CAPS Take 200 mg by mouth every evening.      Evolocumab (REPATHA SURECLICK) 140 MG/ML SOAJ INJECT 140MG  SUBCUTANEOUSLY EVERY 2 WEEKS 6 mL 3   isosorbide mononitrate (IMDUR) 60 MG 24 hr tablet TAKE 1 TABLET BY MOUTH IN THE  EVENING 30 tablet 11   losartan (COZAAR) 25 MG tablet Take 25 mg by mouth daily.     naproxen sodium (ALEVE) 220 MG tablet Take 220 mg by mouth 2 (two) times daily as needed (pain.).     pantoprazole (PROTONIX) 40 MG tablet Take 1 tablet (40 mg total) by mouth daily as needed. 90 tablet 3   rosuvastatin (CRESTOR) 10 MG tablet TAKE 1 TABLET BY MOUTH DAILY 90 tablet 3   nitroGLYCERIN (NITROSTAT) 0.4 MG SL tablet Place 1 tablet (0.4 mg total) under the tongue every 5 (five) minutes as needed for chest pain. 20 tablet 0   No current facility-administered  medications for this visit.    Review of Systems  Constitutional:  Constitutional negative. HENT: HENT negative.  Eyes: Eyes negative.  Respiratory: Respiratory negative.  Cardiovascular: Cardiovascular negative.  GI: Gastrointestinal negative.  Musculoskeletal: Musculoskeletal negative.  Skin: Skin negative.  Neurological: Neurological negative. Hematologic: Hematologic/lymphatic negative.  Psychiatric: Psychiatric negative.        Objective:  Objective   Vitals:   08/29/23 1027 08/29/23 1029  BP: 118/76 131/85  Pulse: 68   Resp: 20   Temp: 98.2 F (36.8 C)   SpO2: 94%   Weight: 206 lb (93.4 kg)   Height: 6\' 2"  (1.88 m)    Body mass index is 26.45 kg/m.  Physical Exam HENT:     Head: Normocephalic.     Nose: Nose normal.  Eyes:     Pupils: Pupils are equal, round, and reactive to light.  Neck:     Vascular: Carotid bruit present.  Cardiovascular:     Rate and Rhythm: Normal rate.  Pulmonary:     Effort: Pulmonary effort is normal.  Abdominal:     General: Abdomen is flat.  Skin:    General: Skin is warm.     Capillary Refill: Capillary refill takes less than 2 seconds.  Neurological:     General: No focal deficit present.     Mental Status: He is alert.  Psychiatric:        Mood and Affect: Mood normal.        Thought Content: Thought content normal.        Judgment: Judgment normal.     Data: Right Carotid Findings:  +----------+--------+--------+--------+------------------+-----------------  -+           PSV cm/sEDV cm/sStenosisPlaque DescriptionComments             +----------+--------+--------+--------+------------------+-----------------  -+  CCA Prox  68      14                                                     +----------+--------+--------+--------+------------------+-----------------  -+  CCA Distal42      7                                 intimal  thickening   +----------+--------+--------+--------+------------------+-----------------  -+  ICA  Prox  434     164     80-99%  smooth                                 +----------+--------+--------+--------+------------------+-----------------  -+  ICA Mid   202     52                                                     +----------+--------+--------+--------+------------------+-----------------  -+  ICA Distal95      13                                                     +----------+--------+--------+--------+------------------+-----------------  -+  ECA      57      7                                                      +----------+--------+--------+--------+------------------+-----------------  -+   +----------+--------+-------+--------+-------------------+           PSV cm/sEDV cmsDescribeArm Pressure (mmHG)  +----------+--------+-------+--------+-------------------+  [id number]                   110                  +----------+--------+-------+--------+-------------------+    Right vertebral artery not well seen.   Left Carotid Findings:  +----------+--------+--------+--------+------------------+--------+           PSV cm/sEDV cm/sStenosisPlaque DescriptionComments  +----------+--------+--------+--------+------------------+--------+  CCA Prox  107     26                                          +----------+--------+--------+--------+------------------+--------+  CCA Distal89      20              smooth                      +----------+--------+--------+--------+------------------+--------+  ICA Prox  80      23      1-39%   smooth                      +----------+--------+--------+--------+------------------+--------+  ICA Mid   80      19                                          +----------+--------+--------+--------+------------------+--------+  ICA Distal92      27                                           +----------+--------+--------+--------+------------------+--------+  ECA      59      12  smooth                      +----------+--------+--------+--------+------------------+--------+   +----------+--------+--------+---------+-------------------+           PSV cm/sEDV cm/sDescribe Arm Pressure (mmHG)  +----------+--------+--------+---------+-------------------+  Subclavian214            Turbulent108                  +----------+--------+--------+---------+-------------------+   +---------+--------+--+--------+--+---------+  VertebralPSV cm/s57EDV cm/s15Antegrade  +---------+--------+--+--------+--+---------+         Summary:  Right Carotid: Velocities in the right ICA are consistent with a 80-99%                 stenosis.   Left Carotid: Velocities in the left ICA are consistent with a 1-39%  stenosis.   Vertebrals: Left vertebral artery demonstrates antegrade flow. Right not  well              seen, possibly occluded.  Subclavians: Normal flow hemodynamics were seen in bilateral subclavian               arteries.    CTA reviewed with the patient and there are no results      Assessment/Plan:    78 year old male with history of right neck radiation and now high-grade right ICA stenosis confirmed with CTA.  We reviewed his images together today and discussed his options being continued medical therapy stent via transcarotid or transfemoral route versus carotid endarterectomy.  Given his history of radiation we have discussed proceeding with transcarotid artery stenting and we discussed the risk and benefits including 1 to 2% risk of stroke, the need for cutdown on his right sided neck and access of his left common femoral vein and the possible need for conversion to right carotid endarterectomy.  The patient demonstrates good understanding of all this will continue aspirin, Plavix and statin we will get him scheduled in the near  future.     Maeola Harman MD [facility]

## 2023-09-02 ENCOUNTER — Encounter: Payer: Self-pay | Admitting: Vascular Surgery

## 2023-09-03 ENCOUNTER — Other Ambulatory Visit: Payer: Self-pay

## 2023-09-03 DIAGNOSIS — I6521 Occlusion and stenosis of right carotid artery: Secondary | ICD-10-CM

## 2023-09-07 ENCOUNTER — Encounter (HOSPITAL_COMMUNITY): Payer: Self-pay

## 2023-09-07 NOTE — Pre-Procedure Instructions (Signed)
 Surgical Instructions   Your procedure is scheduled on September 14, 2023. Report to [facility] Main Entrance "A" at 9:45 A.M., then check in with the Admitting office. Any questions or running late day of surgery: call [phone]  Questions prior to your surgery date: call [phone], Monday-Friday, 8am-4pm. If you experience any cold or flu symptoms such as cough, fever, chills, shortness of breath, etc. between now and your scheduled surgery, please notify us  at the above number.     Remember:  Do not eat or drink after midnight the night before your surgery    Take these medicines the morning of surgery with A SIP OF WATER: clopidogrel  (PLAVIX )  pantoprazole  (PROTONIX )  rosuvastatin  (CRESTOR )    May take these medicines IF NEEDED: acetaminophen  (TYLENOL )  nitroGLYCERIN  (NITROSTAT )  - if dose taken prior to surgery, please call either of the above phone numbers   One week prior to surgery, STOP taking any Aleve , Naproxen , Ibuprofen, Motrin, Advil, Goody's, BC's, all herbal medications, fish oil, and non-prescription vitamins.                     Do NOT Smoke (Tobacco/Vaping) for 24 hours prior to your procedure.  If you use a CPAP at night, you may bring your mask/headgear for your overnight stay.   You will be asked to remove any contacts, glasses, piercing's, hearing aid's, dentures/partials prior to surgery. Please bring cases for these items if needed.    Patients discharged the day of surgery will not be allowed to drive home, and someone needs to stay with them for 24 hours.  SURGICAL WAITING ROOM VISITATION Patients may have no more than 2 support people in the waiting area - these visitors may rotate.   Pre-op nurse will coordinate an appropriate time for 1 ADULT support person, who may not rotate, to accompany patient in pre-op.  Children under the age of 71 must have an adult with them who is not the patient and must remain in the main waiting area with an  adult.  If the patient needs to stay at the hospital during part of their recovery, the visitor guidelines for inpatient rooms apply.  Please refer to the [facility] website for the visitor guidelines for any additional information.   If you received a COVID test during your pre-op visit  it is requested that you wear a mask when out in public, stay away from anyone that may not be feeling well and notify your surgeon if you develop symptoms. If you have been in contact with anyone that has tested positive in the last 10 days please notify you surgeon.      Pre-operative CHG Bathing Instructions   You can play a key role in reducing the risk of infection after surgery. Your skin needs to be as free of germs as possible. You can reduce the number of germs on your skin by washing with CHG (chlorhexidine  gluconate) soap before surgery. CHG is an antiseptic soap that kills germs and continues to kill germs even after washing.   DO NOT use if you have an allergy to chlorhexidine /CHG or antibacterial soaps. If your skin becomes reddened or irritated, stop using the CHG and notify one of our RNs at [phone].              TAKE A SHOWER THE NIGHT BEFORE SURGERY AND THE DAY OF SURGERY    Please keep in mind the following:  DO NOT shave, including legs and  underarms, 48 hours prior to surgery.   You may shave your face before/day of surgery.  Place clean sheets on your bed the night before surgery Use a clean washcloth (not used since being washed) for each shower. DO NOT sleep with pet's night before surgery.  CHG Shower Instructions:  Wash your face and private area with normal soap. If you choose to wash your hair, wash first with your normal shampoo.  After you use shampoo/soap, rinse your hair and body thoroughly to remove shampoo/soap residue.  Turn the water OFF and apply half the bottle of CHG soap to a CLEAN washcloth.  Apply CHG soap ONLY FROM YOUR NECK DOWN TO YOUR TOES (washing  for 3-5 minutes)  DO NOT use CHG soap on face, private areas, open wounds, or sores.  Pay special attention to the area where your surgery is being performed.  If you are having back surgery, having someone wash your back for you may be helpful. Wait 2 minutes after CHG soap is applied, then you may rinse off the CHG soap.  Pat dry with a clean towel  Put on clean pajamas    Additional instructions for the day of surgery: DO NOT APPLY any lotions, deodorants, cologne, or perfumes.   Do not wear jewelry or makeup Do not wear nail polish, gel polish, artificial nails, or any other type of covering on natural nails (fingers and toes) Do not bring valuables to the hospital. [facility] is not responsible for valuables/personal belongings. Put on clean/comfortable clothes.  Please brush your teeth.  Ask your nurse before applying any prescription medications to the skin.

## 2023-09-10 ENCOUNTER — Encounter (HOSPITAL_COMMUNITY): Payer: Self-pay

## 2023-09-10 ENCOUNTER — Encounter (HOSPITAL_COMMUNITY)
Admission: RE | Admit: 2023-09-10 | Discharge: 2023-09-10 | Disposition: A | Discharge status: Home / Self Care | Payer: Medicare Other | Source: Ambulatory Visit | Attending: Vascular Surgery

## 2023-09-10 ENCOUNTER — Other Ambulatory Visit: Payer: Self-pay

## 2023-09-10 VITALS — BP 122/67 | HR 51 | Temp 97.0°F | Resp 19 | Ht 74.0 in | Wt 208.0 lb

## 2023-09-10 DIAGNOSIS — Z87891 Personal history of nicotine dependence: Secondary | ICD-10-CM | POA: Insufficient documentation

## 2023-09-10 DIAGNOSIS — K219 Gastro-esophageal reflux disease without esophagitis: Secondary | ICD-10-CM | POA: Diagnosis not present

## 2023-09-10 DIAGNOSIS — Z7902 Long term (current) use of antithrombotics/antiplatelets: Secondary | ICD-10-CM | POA: Insufficient documentation

## 2023-09-10 DIAGNOSIS — Z7982 Long term (current) use of aspirin: Secondary | ICD-10-CM | POA: Insufficient documentation

## 2023-09-10 DIAGNOSIS — I6521 Occlusion and stenosis of right carotid artery: Secondary | ICD-10-CM | POA: Diagnosis not present

## 2023-09-10 DIAGNOSIS — Z85810 Personal history of malignant neoplasm of tongue: Secondary | ICD-10-CM | POA: Insufficient documentation

## 2023-09-10 DIAGNOSIS — Z01818 Encounter for other preprocedural examination: Secondary | ICD-10-CM | POA: Diagnosis present

## 2023-09-10 DIAGNOSIS — Z955 Presence of coronary angioplasty implant and graft: Secondary | ICD-10-CM | POA: Diagnosis not present

## 2023-09-10 DIAGNOSIS — Z01812 Encounter for preprocedural laboratory examination: Secondary | ICD-10-CM | POA: Diagnosis not present

## 2023-09-10 DIAGNOSIS — I25118 Atherosclerotic heart disease of native coronary artery with other forms of angina pectoris: Secondary | ICD-10-CM | POA: Insufficient documentation

## 2023-09-10 DIAGNOSIS — I10 Essential (primary) hypertension: Secondary | ICD-10-CM | POA: Diagnosis not present

## 2023-09-10 HISTORY — DX: Peripheral vascular disease, unspecified: I73.9

## 2023-09-10 LAB — CBC
HCT: 40.9 % (ref 39.0–52.0)
Hemoglobin: 13.6 g/dL (ref 13.0–17.0)
MCH: 30.5 pg (ref 26.0–34.0)
MCHC: 33.3 g/dL (ref 30.0–36.0)
MCV: 91.7 fL (ref 80.0–100.0)
Platelets: 196 10*3/uL (ref 150–400)
RBC: 4.46 MIL/uL (ref 4.22–5.81)
RDW: 13.2 % (ref 11.5–15.5)
WBC: 3.2 10*3/uL — ABNORMAL LOW (ref 4.0–10.5)
nRBC: 0 % (ref 0.0–0.2)

## 2023-09-10 LAB — URINALYSIS, ROUTINE W REFLEX MICROSCOPIC
Bilirubin Urine: NEGATIVE
Glucose, UA: NEGATIVE mg/dL
Hgb urine dipstick: NEGATIVE
Ketones, ur: NEGATIVE mg/dL
Leukocytes,Ua: NEGATIVE
Nitrite: NEGATIVE
Protein, ur: NEGATIVE mg/dL
Specific Gravity, Urine: 1.011 (ref 1.005–1.030)
pH: 6 (ref 5.0–8.0)

## 2023-09-10 LAB — PROTIME-INR
INR: 1.1 (ref 0.8–1.2)
Prothrombin Time: 14.1 s (ref 11.4–15.2)

## 2023-09-10 LAB — COMPREHENSIVE METABOLIC PANEL
ALT: 21 U/L (ref 0–44)
AST: 22 U/L (ref 15–41)
Albumin: 4.1 g/dL (ref 3.5–5.0)
Alkaline Phosphatase: 34 U/L — ABNORMAL LOW (ref 38–126)
Anion gap: 9 (ref 5–15)
BUN: 14 mg/dL (ref 8–23)
CO2: 28 mmol/L (ref 22–32)
Calcium: 9.7 mg/dL (ref 8.9–10.3)
Chloride: 103 mmol/L (ref 98–111)
Creatinine, Ser: 1.17 mg/dL (ref 0.61–1.24)
GFR, Estimated: >60 mL/min (ref >60)
Glucose, Bld: 125 mg/dL — ABNORMAL HIGH (ref 70–99)
Potassium: 4 mmol/L (ref 3.5–5.1)
Sodium: 140 mmol/L (ref 135–145)
Total Bilirubin: 0.8 mg/dL (ref 0.0–1.2)
Total Protein: 6.6 g/dL (ref 6.5–8.1)

## 2023-09-10 LAB — TYPE AND SCREEN
ABO/RH(D): A POS
Antibody Screen: NEGATIVE

## 2023-09-10 LAB — SURGICAL PCR SCREEN
MRSA, PCR: NEGATIVE
Staphylococcus aureus: NEGATIVE

## 2023-09-10 LAB — APTT: aPTT: 25 s (ref 24–36)

## 2023-09-10 NOTE — Progress Notes (Addendum)
 PCP - Jarome Merritt. Rumball, DO Cardiologist - Dr. Jackquelyn Mass - Last office visit 08/28/2023  PPM/ICD - Denies Device Orders - n/a Rep Notified - n/a  Chest x-ray - n/a - Pt had CT Chest/Lung 06/06/2023 EKG - 08/28/2023 Stress Test - 08/13/2019 ECHO - 04/13/2023 Cardiac Cath - 10/09/2019  Sleep Study - Denies CPAP - n/a  No DM  Last dose of GLP1 agonist- n/a GLP1 instructions: n/a  Blood Thinner Instructions: Pt instructed to continue taking Plavix  and ASA through DOS. Pt normally takes these medications in the evening and instructed to continue taking them as he normally does and not take any the morning of surgery.  NPO after midnight  COVID TEST- n/a   Anesthesia review: Yes. Cardiac hx (CAD, HTN, PVD) with regular cardiology follow-up   Patient denies shortness of breath, fever, cough and chest pain at PAT appointment. Pt endorses a cold January 18th with non-productive cough. Symptoms resolved within a few days. He has had resolution of symptoms for over two weeks.   All instructions explained to the patient, with a verbal understanding of the material. Patient agrees to go over the instructions while at home for a better understanding. Patient also instructed to self quarantine after being tested for COVID-19. The opportunity to ask questions was provided.

## 2023-09-11 ENCOUNTER — Encounter (HOSPITAL_COMMUNITY): Payer: Self-pay

## 2023-09-11 NOTE — Progress Notes (Signed)
Case: [id number] Date/Time: 09/14/23 1130   Procedure: RIGHT TRANSCAROTID ARTERY REVASCULARIZATION (Right)   Anesthesia type: Choice   Pre-op diagnosis: Right Carotid Artery Stenosis   [facility] OR ROOM 16 / [facility]   Surgeons: Maeola Harman, MD       DISCUSSION: Shane Shelton is a 78 year old male who presents to PAT prior to surgery above.  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  Patient follows with Cardiology for hx of CAD s/p PCI (2018) and multiple caths (2000, 2016, 2018, 2021) due to chest pain. He has moderate residual disease and takes ASA and Plavix. He has chronic exertional angina and takes Imdur. Last seen by Cardiology on 08/28/23. He reported dizziness and hypotension. Losartan dose was decreased with improvement in symptoms. Per Dr. Flora Lipps: "Discussed the high risk of stroke with this level of stenosis and the need for surgical intervention. Recommend proceeding with surgery. 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 Treated with chemo and radiation on the right side in 2011. Previously followed with ENT.  Blood Thinner Instructions: Pt instructed to continue taking Plavix and ASA through DOS. Pt normally takes these medications in the evening and instructed to continue taking them as he normally does and not take any the morning of surgery.  VS: BP 122/67   Pulse (!) 51   Temp (!) 36.1 C   Resp 19   Ht 6\' 2"  (1.88 m)   Wt 94.3 kg   SpO2 98%   BMI 26.71 kg/m   PROVIDERS: [name], DO Cardiologist:  [name], MD   LABS: Labs reviewed: Acceptable for surgery. (all labs ordered are listed, but only abnormal results are displayed)  Labs Reviewed  CBC - Abnormal; Notable for the following components:      Result Value    WBC 3.2 (*)    All other components within normal limits  COMPREHENSIVE METABOLIC PANEL - Abnormal; Notable for the following components:   Glucose, Bld 125 (*)    Alkaline Phosphatase 34 (*)    All other components within normal limits  SURGICAL PCR SCREEN  PROTIME-INR  APTT  URINALYSIS, ROUTINE W REFLEX MICROSCOPIC  TYPE AND SCREEN     IMAGES: CTA head/neck 08/22/23:  IMPRESSION: 1. Severe stenosis of the origin of the right ICA with an angiographic string sign. 2. Second stenosis of the mid right ICA that measures 60% by NASCET criteria. 3. No intracranial large vessel occlusion or hemodynamically significant stenosis.  EKG 08/28/23  NSR, rate 67  CV:  TTE 04/13/2023  1. Left ventricular ejection fraction, by estimation, is 65 to 70%. Left  ventricular ejection fraction by PLAX is 69 %. The left ventricle has  normal function. The left ventricle has no regional wall motion  abnormalities. Left ventricular diastolic  parameters are consistent with Grade I diastolic dysfunction (impaired  relaxation).   2. Right ventricular systolic function is normal. The right ventricular  size is normal. Tricuspid regurgitation signal is inadequate for assessing  PA pressure.   3. Left atrial size was mildly dilated.   4. The mitral valve is grossly normal. Trivial mitral valve  regurgitation.   5. The aortic valve is tricuspid. Aortic valve regurgitation is not  visualized.   6. The inferior vena cava is normal in size with  greater than 50%  respiratory variability, suggesting right atrial pressure of 3 mmHg.   LHC 10/09/2019:   Segmental proximal 50 to 70% stenosis with irregularity and lucency.  DFR 0.93 and after contrast 0.89.  Distal LAD before bifurcating into a diagonal and small apical segment, 75 to 80%.  Could be the source of angina. Mid and distal left main 25%. Circumflex is relatively small with 3 obtuse marginal branches and no high-grade obstruction noted. RCA is  dominant, previously stented throughout the mid segment.  Proximal to stents is a relatively lucent 30 to 50% narrowing.  There is also a 50% distal RCA stenosis.  The PDA is large and widely patent.  The first left ventricular branch contains ostial 80% narrowing.  The remainder of the right coronary artery which is large is widely patent. Normal LV function.   Past Medical History:  Diagnosis Date   Abdominal pain 12/23/2021   Allergy    hayfever   Cancer (HCC)    Throat cancer 2011 - Radiation and Chemotherapy   Carotid artery occlusion    Coronary artery disease    Hyperlipidemia    Hypertension    Peripheral vascular disease (HCC)    Carotid Artery Stenosis   Tubular adenoma of colon 11/25/2013   Weight loss 12/23/2021    Past Surgical History:  Procedure Laterality Date   CARDIAC CATHETERIZATION  05/08/2017   S/P S/P PCI, DES of Mid RCA at [facility] in [place]   COLONOSCOPY     multiple   CORONARY PRESSURE/FFR STUDY N/A 10/09/2019   Procedure: INTRAVASCULAR PRESSURE WIRE/FFR STUDY;  Surgeon: Lyn Records, MD;  Location: [facility] INVASIVE CV LAB;  Service: Cardiovascular;  Laterality: N/A;   ESOPHAGOGASTRODUODENOSCOPY     multiple   ESOPHAGOSCOPY W/ PERCUTANEOUS GASTROSTOMY TUBE PLACEMENT  2011   LEFT HEART CATH AND CORONARY ANGIOGRAPHY N/A 10/09/2019   Procedure: LEFT HEART CATH AND CORONARY ANGIOGRAPHY;  Surgeon: Lyn Records, MD;  Location: [facility] INVASIVE CV LAB;  Service: Cardiovascular;  Laterality: N/A;    MEDICATIONS:  acetaminophen (TYLENOL) 500 MG tablet   aspirin EC 81 MG tablet   clopidogrel (PLAVIX) 75 MG tablet   Coenzyme Q-10 200 MG CAPS   Evolocumab (REPATHA SURECLICK) 140 MG/ML SOAJ   isosorbide mononitrate (IMDUR) 60 MG 24 hr tablet   losartan (COZAAR) 25 MG tablet   naproxen sodium (ALEVE) 220 MG tablet   nitroGLYCERIN (NITROSTAT) 0.4 MG SL tablet   pantoprazole (PROTONIX) 40 MG tablet   rosuvastatin (CRESTOR) 10 MG tablet   No current  facility-administered medications for this encounter.   Marcille Blanco [facility] Surgical Short Stay/[facility] Phone [phone] 09/11/2023 3:25 PM

## 2023-09-11 NOTE — Anesthesia Preprocedure Evaluation (Signed)
Anesthesia Evaluation  Patient identified by MRN, date of birth, ID band Patient awake    Reviewed: Allergy & Precautions, H&P , NPO status , Patient's Chart, lab work & pertinent test results  Airway Mallampati: II   Neck ROM: full    Dental   Pulmonary former smoker   breath sounds clear to auscultation       Cardiovascular hypertension, + angina  + CAD and + Peripheral Vascular Disease   Rhythm:regular Rate:Normal     Neuro/Psych  Neuromuscular disease    GI/Hepatic ,GERD  ,,  Endo/Other    Renal/GU      Musculoskeletal   Abdominal   Peds  Hematology   Anesthesia Other Findings   Reproductive/Obstetrics                             Anesthesia Physical Anesthesia Plan  ASA: 3  Anesthesia Plan: General   Post-op Pain Management:    Induction: Intravenous  PONV Risk Score and Plan: 2 and Ondansetron, Dexamethasone and Treatment may vary due to age or medical condition  Airway Management Planned: Oral ETT and Video Laryngoscope Planned  Additional Equipment: Arterial line  Intra-op Plan:   Post-operative Plan: Extubation in OR  Informed Consent: I have reviewed the patients History and Physical, chart, labs and discussed the procedure including the risks, benefits and alternatives for the proposed anesthesia with the patient or authorized representative who has indicated his/her understanding and acceptance.     Dental advisory given  Plan Discussed with: CRNA, Anesthesiologist and Surgeon  Anesthesia Plan Comments: (See PAT note from 2/10 by K Gekas PA-C )        Anesthesia Quick Evaluation

## 2023-09-14 ENCOUNTER — Inpatient Hospital Stay (HOSPITAL_COMMUNITY): Payer: Medicare Other | Admitting: Medical

## 2023-09-14 ENCOUNTER — Encounter (HOSPITAL_COMMUNITY): Payer: Self-pay | Admitting: Vascular Surgery

## 2023-09-14 ENCOUNTER — Inpatient Hospital Stay (HOSPITAL_COMMUNITY): Payer: Medicare Other | Admitting: Anesthesiology

## 2023-09-14 ENCOUNTER — Inpatient Hospital Stay (HOSPITAL_COMMUNITY)
Admission: RE | Admit: 2023-09-14 | Discharge: 2023-09-15 | DRG: 036 | Disposition: A | Discharge status: Home / Self Care | Payer: Medicare Other | Attending: Vascular Surgery | Admitting: Vascular Surgery

## 2023-09-14 ENCOUNTER — Encounter (HOSPITAL_COMMUNITY)
Admission: RE | Disposition: A | Discharge status: Home / Self Care | Payer: Self-pay | Source: Home / Self Care | Attending: Vascular Surgery

## 2023-09-14 ENCOUNTER — Other Ambulatory Visit: Payer: Self-pay

## 2023-09-14 ENCOUNTER — Inpatient Hospital Stay (HOSPITAL_COMMUNITY): Payer: Medicare Other

## 2023-09-14 DIAGNOSIS — I739 Peripheral vascular disease, unspecified: Secondary | ICD-10-CM | POA: Diagnosis present

## 2023-09-14 DIAGNOSIS — I6521 Occlusion and stenosis of right carotid artery: Secondary | ICD-10-CM | POA: Diagnosis not present

## 2023-09-14 DIAGNOSIS — Z8249 Family history of ischemic heart disease and other diseases of the circulatory system: Secondary | ICD-10-CM

## 2023-09-14 DIAGNOSIS — I2510 Atherosclerotic heart disease of native coronary artery without angina pectoris: Secondary | ICD-10-CM | POA: Diagnosis not present

## 2023-09-14 DIAGNOSIS — Z888 Allergy status to other drugs, medicaments and biological substances status: Secondary | ICD-10-CM

## 2023-09-14 DIAGNOSIS — Z7902 Long term (current) use of antithrombotics/antiplatelets: Secondary | ICD-10-CM | POA: Diagnosis not present

## 2023-09-14 DIAGNOSIS — I6529 Occlusion and stenosis of unspecified carotid artery: Principal | ICD-10-CM | POA: Diagnosis present

## 2023-09-14 DIAGNOSIS — Z87891 Personal history of nicotine dependence: Secondary | ICD-10-CM

## 2023-09-14 DIAGNOSIS — Z85819 Personal history of malignant neoplasm of unspecified site of lip, oral cavity, and pharynx: Secondary | ICD-10-CM | POA: Diagnosis not present

## 2023-09-14 DIAGNOSIS — Z79899 Other long term (current) drug therapy: Secondary | ICD-10-CM

## 2023-09-14 DIAGNOSIS — F17210 Nicotine dependence, cigarettes, uncomplicated: Secondary | ICD-10-CM

## 2023-09-14 DIAGNOSIS — Z860101 Personal history of adenomatous and serrated colon polyps: Secondary | ICD-10-CM

## 2023-09-14 DIAGNOSIS — Z83438 Family history of other disorder of lipoprotein metabolism and other lipidemia: Secondary | ICD-10-CM

## 2023-09-14 DIAGNOSIS — Z006 Encounter for examination for normal comparison and control in clinical research program: Secondary | ICD-10-CM | POA: Diagnosis not present

## 2023-09-14 DIAGNOSIS — Z9221 Personal history of antineoplastic chemotherapy: Secondary | ICD-10-CM | POA: Diagnosis not present

## 2023-09-14 DIAGNOSIS — Z923 Personal history of irradiation: Secondary | ICD-10-CM | POA: Diagnosis not present

## 2023-09-14 DIAGNOSIS — I25119 Atherosclerotic heart disease of native coronary artery with unspecified angina pectoris: Secondary | ICD-10-CM | POA: Diagnosis not present

## 2023-09-14 DIAGNOSIS — I10 Essential (primary) hypertension: Secondary | ICD-10-CM

## 2023-09-14 DIAGNOSIS — Z833 Family history of diabetes mellitus: Secondary | ICD-10-CM

## 2023-09-14 DIAGNOSIS — Y842 Radiological procedure and radiotherapy as the cause of abnormal reaction of the patient, or of later complication, without mention of misadventure at the time of the procedure: Secondary | ICD-10-CM | POA: Diagnosis present

## 2023-09-14 DIAGNOSIS — Z7982 Long term (current) use of aspirin: Secondary | ICD-10-CM | POA: Diagnosis not present

## 2023-09-14 DIAGNOSIS — Z9861 Coronary angioplasty status: Secondary | ICD-10-CM | POA: Diagnosis not present

## 2023-09-14 DIAGNOSIS — E785 Hyperlipidemia, unspecified: Secondary | ICD-10-CM | POA: Diagnosis present

## 2023-09-14 HISTORY — PX: TRANSCAROTID ARTERY REVASCULARIZATIONÂ: SHX6778

## 2023-09-14 HISTORY — PX: ULTRASOUND GUIDANCE FOR VASCULAR ACCESS: SHX6516

## 2023-09-14 LAB — POCT ACTIVATED CLOTTING TIME: Activated Clotting Time: 256 s

## 2023-09-14 LAB — CBC
HCT: 38.8 % — ABNORMAL LOW (ref 39.0–52.0)
Hemoglobin: 13 g/dL (ref 13.0–17.0)
MCH: 30.3 pg (ref 26.0–34.0)
MCHC: 33.5 g/dL (ref 30.0–36.0)
MCV: 90.4 fL (ref 80.0–100.0)
Platelets: 197 10*3/uL (ref 150–400)
RBC: 4.29 MIL/uL (ref 4.22–5.81)
RDW: 13.2 % (ref 11.5–15.5)
WBC: 5.8 10*3/uL (ref 4.0–10.5)
nRBC: 0 % (ref 0.0–0.2)

## 2023-09-14 LAB — CREATININE, SERUM
Creatinine, Ser: 1.24 mg/dL (ref 0.61–1.24)
GFR, Estimated: >60 mL/min (ref >60)

## 2023-09-14 LAB — ABO/RH: ABO/RH(D): A POS

## 2023-09-14 SURGERY — TRANSCAROTID ARTERY REVASCULARIZATION (TCAR)
Anesthesia: General | Site: Neck | Laterality: Right

## 2023-09-14 MED ORDER — HEPARIN SODIUM (PORCINE) 5000 UNIT/ML IJ SOLN
5000.0000 [IU] | Freq: Three times a day (TID) | INTRAMUSCULAR | Status: DC
  Administered 2023-09-15: 5000 [IU] via SUBCUTANEOUS
  Filled 2023-09-14: qty 1

## 2023-09-14 MED ORDER — SODIUM CHLORIDE 0.9 % IV SOLN: INTRAVENOUS | Status: DC

## 2023-09-14 MED ORDER — NAPROXEN 250 MG PO TABS: 250.0000 mg | ORAL_TABLET | Freq: Two times a day (BID) | ORAL | Status: DC | PRN

## 2023-09-14 MED ORDER — LIDOCAINE HCL (PF) 2 % IJ SOLN
INTRAMUSCULAR | Status: DC | PRN
  Administered 2023-09-14: 60 mg via INTRADERMAL

## 2023-09-14 MED ORDER — ROCURONIUM BROMIDE 100 MG/10ML IV SOLN
INTRAVENOUS | Status: DC | PRN
  Administered 2023-09-14: 10 mg via INTRAVENOUS
  Administered 2023-09-14: 50 mg via INTRAVENOUS

## 2023-09-14 MED ORDER — FENTANYL CITRATE (PF) 100 MCG/2ML IJ SOLN: 25.0000 ug | INTRAMUSCULAR | Status: DC | PRN

## 2023-09-14 MED ORDER — ACETAMINOPHEN 500 MG PO TABS: 1000.0000 mg | ORAL_TABLET | Freq: Four times a day (QID) | ORAL | Status: DC | PRN

## 2023-09-14 MED ORDER — PROPOFOL 10 MG/ML IV BOLUS
INTRAVENOUS | Status: AC
  Filled 2023-09-14: qty 20

## 2023-09-14 MED ORDER — CEFAZOLIN SODIUM-DEXTROSE 2-4 GM/100ML-% IV SOLN
2.0000 g | Freq: Three times a day (TID) | INTRAVENOUS | Status: AC
  Administered 2023-09-14 – 2023-09-15 (×2): 2 g via INTRAVENOUS
  Filled 2023-09-14 (×2): qty 100

## 2023-09-14 MED ORDER — SENNOSIDES-DOCUSATE SODIUM 8.6-50 MG PO TABS: 1.0000 | ORAL_TABLET | Freq: Every evening | ORAL | Status: DC | PRN

## 2023-09-14 MED ORDER — CHLORHEXIDINE GLUCONATE CLOTH 2 % EX PADS: 6.0000 | MEDICATED_PAD | Freq: Once | CUTANEOUS | Status: DC

## 2023-09-14 MED ORDER — LACTATED RINGERS IV SOLN: INTRAVENOUS | Status: DC

## 2023-09-14 MED ORDER — HEPARIN 6000 UNIT IRRIGATION SOLUTION
Status: AC
Start: 2023-09-14 — End: ?
  Filled 2023-09-14: qty 500

## 2023-09-14 MED ORDER — ORAL CARE MOUTH RINSE: 15.0000 mL | Freq: Once | OROMUCOSAL | Status: AC

## 2023-09-14 MED ORDER — FENTANYL CITRATE (PF) 250 MCG/5ML IJ SOLN
INTRAMUSCULAR | Status: DC | PRN
  Administered 2023-09-14: 100 ug via INTRAVENOUS
  Administered 2023-09-14: 50 ug via INTRAVENOUS

## 2023-09-14 MED ORDER — GUAIFENESIN-DM 100-10 MG/5ML PO SYRP: 15.0000 mL | ORAL_SOLUTION | ORAL | Status: DC | PRN

## 2023-09-14 MED ORDER — POTASSIUM CHLORIDE CRYS ER 20 MEQ PO TBCR: 20.0000 meq | EXTENDED_RELEASE_TABLET | Freq: Every day | ORAL | Status: DC | PRN

## 2023-09-14 MED ORDER — SODIUM CHLORIDE 0.9 % IV SOLN: 500.0000 mL | Freq: Once | INTRAVENOUS | Status: DC | PRN

## 2023-09-14 MED ORDER — CLOPIDOGREL BISULFATE 75 MG PO TABS
75.0000 mg | ORAL_TABLET | Freq: Every day | ORAL | Status: DC
  Administered 2023-09-15: 75 mg via ORAL
  Filled 2023-09-14: qty 1

## 2023-09-14 MED ORDER — FENTANYL CITRATE (PF) 250 MCG/5ML IJ SOLN
INTRAMUSCULAR | Status: AC
  Filled 2023-09-14: qty 5

## 2023-09-14 MED ORDER — OXYCODONE-ACETAMINOPHEN 5-325 MG PO TABS: 1.0000 | ORAL_TABLET | ORAL | Status: DC | PRN

## 2023-09-14 MED ORDER — PHENYLEPHRINE HCL-NACL 20-0.9 MG/250ML-% IV SOLN
INTRAVENOUS | Status: DC | PRN
  Administered 2023-09-14: 25 ug/min via INTRAVENOUS

## 2023-09-14 MED ORDER — ROSUVASTATIN CALCIUM 5 MG PO TABS
10.0000 mg | ORAL_TABLET | Freq: Every day | ORAL | Status: DC
  Administered 2023-09-14 – 2023-09-15 (×2): 10 mg via ORAL
  Filled 2023-09-14 (×2): qty 2

## 2023-09-14 MED ORDER — HEMOSTATIC AGENTS (NO CHARGE) OPTIME
TOPICAL | Status: DC | PRN
  Administered 2023-09-14: 1 via TOPICAL

## 2023-09-14 MED ORDER — EPHEDRINE SULFATE (PRESSORS) 50 MG/ML IJ SOLN
INTRAMUSCULAR | Status: DC | PRN
  Administered 2023-09-14 (×3): 5 mg via INTRAVENOUS

## 2023-09-14 MED ORDER — CHLORHEXIDINE GLUCONATE 0.12 % MT SOLN: 15.0000 mL | Freq: Once | OROMUCOSAL | Status: AC

## 2023-09-14 MED ORDER — NITROGLYCERIN 0.4 MG SL SUBL: 0.4000 mg | SUBLINGUAL_TABLET | SUBLINGUAL | Status: DC | PRN

## 2023-09-14 MED ORDER — PANTOPRAZOLE SODIUM 40 MG PO TBEC
40.0000 mg | DELAYED_RELEASE_TABLET | Freq: Every day | ORAL | Status: DC
  Administered 2023-09-14 – 2023-09-15 (×2): 40 mg via ORAL
  Filled 2023-09-14 (×2): qty 1

## 2023-09-14 MED ORDER — ONDANSETRON HCL 4 MG/2ML IJ SOLN: 4.0000 mg | Freq: Four times a day (QID) | INTRAMUSCULAR | Status: DC | PRN

## 2023-09-14 MED ORDER — PROTAMINE SULFATE 10 MG/ML IV SOLN
INTRAVENOUS | Status: DC | PRN
  Administered 2023-09-14: 50 mg via INTRAVENOUS

## 2023-09-14 MED ORDER — HEPARIN SODIUM (PORCINE) 1000 UNIT/ML IJ SOLN
INTRAMUSCULAR | Status: DC | PRN
  Administered 2023-09-14: 10000 [IU] via INTRAVENOUS

## 2023-09-14 MED ORDER — GLYCOPYRROLATE 0.2 MG/ML IJ SOLN
INTRAMUSCULAR | Status: DC | PRN
  Administered 2023-09-14: .2 mg via INTRAVENOUS

## 2023-09-14 MED ORDER — PHENOL 1.4 % MT LIQD: 1.0000 | OROMUCOSAL | Status: DC | PRN

## 2023-09-14 MED ORDER — ISOSORBIDE MONONITRATE ER 60 MG PO TB24
60.0000 mg | ORAL_TABLET | Freq: Every evening | ORAL | Status: DC
  Administered 2023-09-14: 60 mg via ORAL
  Filled 2023-09-14: qty 1

## 2023-09-14 MED ORDER — PROPOFOL 10 MG/ML IV BOLUS
INTRAVENOUS | Status: DC | PRN
Start: 2023-09-14 — End: 2023-09-14
  Administered 2023-09-14: 50 mg via INTRAVENOUS
  Administered 2023-09-14: 120 mg via INTRAVENOUS

## 2023-09-14 MED ORDER — IODIXANOL 320 MG/ML IV SOLN
INTRAVENOUS | Status: DC | PRN
  Administered 2023-09-14: 20 mL via INTRAVENOUS

## 2023-09-14 MED ORDER — CEFAZOLIN SODIUM-DEXTROSE 2-4 GM/100ML-% IV SOLN
2.0000 g | INTRAVENOUS | Status: AC
  Administered 2023-09-14: 2 g via INTRAVENOUS
  Filled 2023-09-14: qty 100

## 2023-09-14 MED ORDER — HYDROMORPHONE HCL 1 MG/ML IJ SOLN: 0.5000 mg | INTRAMUSCULAR | Status: DC | PRN

## 2023-09-14 MED ORDER — HYDRALAZINE HCL 20 MG/ML IJ SOLN: 5.0000 mg | INTRAMUSCULAR | Status: DC | PRN

## 2023-09-14 MED ORDER — ONDANSETRON HCL 4 MG/2ML IJ SOLN
INTRAMUSCULAR | Status: DC | PRN
  Administered 2023-09-14: 4 mg via INTRAVENOUS

## 2023-09-14 MED ORDER — DEXAMETHASONE SODIUM PHOSPHATE 10 MG/ML IJ SOLN
INTRAMUSCULAR | Status: DC | PRN
Start: 2023-09-14 — End: 2023-09-14
  Administered 2023-09-14: 10 mg via INTRAVENOUS

## 2023-09-14 MED ORDER — LOSARTAN POTASSIUM 25 MG PO TABS
25.0000 mg | ORAL_TABLET | Freq: Every day | ORAL | Status: DC
  Administered 2023-09-15: 25 mg via ORAL
  Filled 2023-09-14 (×2): qty 1

## 2023-09-14 MED ORDER — CHLORHEXIDINE GLUCONATE 0.12 % MT SOLN
OROMUCOSAL | Status: AC
  Administered 2023-09-14: 15 mL via OROMUCOSAL
  Filled 2023-09-14: qty 15

## 2023-09-14 MED ORDER — ALUM & MAG HYDROXIDE-SIMETH 200-200-20 MG/5ML PO SUSP: 15.0000 mL | ORAL | Status: DC | PRN

## 2023-09-14 MED ORDER — SUGAMMADEX SODIUM 200 MG/2ML IV SOLN
INTRAVENOUS | Status: DC | PRN
  Administered 2023-09-14: 200 mg via INTRAVENOUS

## 2023-09-14 MED ORDER — HEPARIN 6000 UNIT IRRIGATION SOLUTION
Status: DC | PRN
  Administered 2023-09-14: 1

## 2023-09-14 MED ORDER — 0.9 % SODIUM CHLORIDE (POUR BTL) OPTIME
TOPICAL | Status: DC | PRN
  Administered 2023-09-14: 1000 mL

## 2023-09-14 MED ORDER — BISACODYL 5 MG PO TBEC: 5.0000 mg | DELAYED_RELEASE_TABLET | Freq: Every day | ORAL | Status: DC | PRN

## 2023-09-14 MED ORDER — ASPIRIN 81 MG PO TBEC: 81.0000 mg | DELAYED_RELEASE_TABLET | Freq: Every evening | ORAL | Status: DC

## 2023-09-14 MED ORDER — DOCUSATE SODIUM 100 MG PO CAPS
100.0000 mg | ORAL_CAPSULE | Freq: Every day | ORAL | Status: DC
  Filled 2023-09-14: qty 1

## 2023-09-14 MED ORDER — OXYCODONE HCL 5 MG PO TABS: 5.0000 mg | ORAL_TABLET | Freq: Once | ORAL | Status: DC | PRN

## 2023-09-14 MED ORDER — OXYCODONE HCL 5 MG/5ML PO SOLN: 5.0000 mg | Freq: Once | ORAL | Status: DC | PRN

## 2023-09-14 MED ORDER — MAGNESIUM SULFATE 2 GM/50ML IV SOLN: 2.0000 g | Freq: Every day | INTRAVENOUS | Status: DC | PRN

## 2023-09-14 SURGICAL SUPPLY — 45 items
BAG BANDED W/RUBBER/TAPE 36X54 (MISCELLANEOUS) ×2 IMPLANT
BAG COUNTER SPONGE SURGICOUNT (BAG) ×2 IMPLANT
CANISTER SUCT 3000ML PPV (MISCELLANEOUS) ×2 IMPLANT
CATH BALLN ENROUTE 5X35 (CATHETERS) IMPLANT
CATH BEACON 5 .035 40 KMP TP (CATHETERS) IMPLANT
CATH ROBINSON RED A/P 18FR (CATHETERS) IMPLANT
CLIP LIGATING EXTRA MED SLVR (CLIP) ×2 IMPLANT
CLIP LIGATING EXTRA SM BLUE (MISCELLANEOUS) ×2 IMPLANT
COVER DOME SNAP 22 D (MISCELLANEOUS) ×2 IMPLANT
COVER PROBE W GEL 5X96 (DRAPES) ×2 IMPLANT
DERMABOND ADVANCED .7 DNX12 (GAUZE/BANDAGES/DRESSINGS) ×2 IMPLANT
DRAPE FEMORAL ANGIO 80X135IN (DRAPES) ×2 IMPLANT
ELECT REM PT RETURN 9FT ADLT (ELECTROSURGICAL) ×2
ELECTRODE REM PT RTRN 9FT ADLT (ELECTROSURGICAL) ×2 IMPLANT
GLOVE BIO SURGEON STRL SZ7.5 (GLOVE) ×2 IMPLANT
GOWN STRL REUS W/ TWL LRG LVL3 (GOWN DISPOSABLE) ×4 IMPLANT
GOWN STRL REUS W/ TWL XL LVL3 (GOWN DISPOSABLE) ×2 IMPLANT
GUIDEWIRE ENROUTE 0.014 (WIRE) ×2 IMPLANT
KIT BASIN OR (CUSTOM PROCEDURE TRAY) ×2 IMPLANT
KIT ENCORE 26 ADVANTAGE (KITS) ×2 IMPLANT
KIT INTRODUCER GALT 7 (INTRODUCER) ×2 IMPLANT
KIT TURNOVER KIT B (KITS) ×2 IMPLANT
NDL HYPO 25GX1X1/2 BEV (NEEDLE) IMPLANT
NEEDLE HYPO 25GX1X1/2 BEV (NEEDLE)
PACK CAROTID (CUSTOM PROCEDURE TRAY) ×2 IMPLANT
PENCIL SMOKE EVACUATOR (MISCELLANEOUS) IMPLANT
POSITIONER HEAD DONUT 9IN (MISCELLANEOUS) ×2 IMPLANT
POWDER SURGICEL 3.0 GRAM (HEMOSTASIS) IMPLANT
SET MICROPUNCTURE 5F STIFF (MISCELLANEOUS) ×2 IMPLANT
STENT TRANSCAROTID SYS 10X40 (Permanent Stent) IMPLANT
STOPCOCK MORSE 400PSI 3WAY (MISCELLANEOUS) IMPLANT
SUT MNCRL AB 4-0 PS2 18 (SUTURE) ×2 IMPLANT
SUT PROLENE 5 0 C 1 24 (SUTURE) ×2 IMPLANT
SUT SILK 2 0 PERMA HAND 18 BK (SUTURE) ×2 IMPLANT
SUT SILK 3-0 18XBRD TIE 12 (SUTURE) IMPLANT
SUT VIC AB 3-0 SH 27X BRD (SUTURE) ×2 IMPLANT
SYR 10ML LL (SYRINGE) ×6 IMPLANT
SYR 20ML LL LF (SYRINGE) ×2 IMPLANT
SYR CONTROL 10ML LL (SYRINGE) IMPLANT
SYSTEM TRANSCAROTID NEUROPRTCT (MISCELLANEOUS) ×2 IMPLANT
TOWEL GREEN STERILE (TOWEL DISPOSABLE) ×2 IMPLANT
TRANSCAROTID NEUROPROTECT SYS (MISCELLANEOUS) ×2
TUBING CIL FLEX 10 FLL-RA (TUBING) IMPLANT
WATER STERILE IRR 1000ML POUR (IV SOLUTION) ×2 IMPLANT
WIRE BENTSON .035X145CM (WIRE) ×2 IMPLANT

## 2023-09-14 NOTE — Op Note (Signed)
Patient name: Shane Shelton MRN: [id number] DOB: 1945/12/05 Sex: male  09/14/2023 Pre-operative Diagnosis: asymptomatic Right ICA stenosis Post-operative diagnosis:  Same Surgeon:  Apolinar Junes C. Randie Heinz, MD Assistant: Clinton Gallant, PA Procedure Performed: 1.  Percutaneous ultrasound-guided cannulation left common femoral vein for placement of 8 French flow reversal sheath 2.  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  He is indicated for transcarotid artery stenting.  We discussed the risk benefits alternatives as well as alternative of transfemoral stent and carotid endarterectomy and he is elected for trans-carotid stenting.  Experience assistant was necessary to facilitate exposure of the common carotid artery as well as placement of wire, balloon, catheter and stent.  Findings: The common carotid artery was free of disease and was easily cannulated and there was no dissection noted.  The takeoff of the ICA was at the near right ankle and was approximately 95% stenosed and there was a second 70% lesion higher on the ICA.  These were initially dilated with 5 mm balloon and stented with a 10 x 40 mm stent and at completion of the initial stenting there was some residual stenosis at the takeoff and this was again ballooned with 5 mm balloon and all stenoses were reduced to less than 20% stenosis with brisk flow into the distal ICA.  At completion the patient was neurologically intact.   Procedure:  The patient was identified in the holding area and taken to the operating room where is placed supine operative table and general anesthesia was induced.  He was sterilely prepped and draped in the right neck and bilateral groins in usual fashion, antibiotics were administered timeout was called.  Ultrasound was used to identify the left  common femoral vein which was patent and compressible.  We cannulated the left common femoral vein with micropuncture needle followed by wire and a sheath and a Bentson wire was placed followed by the 8 [place] flow reversal sheath and this was flushed with heparinized saline.  After ultrasound was used to identify the right common carotid artery a vertical incision was created between the 2 heads of the sternocleidomastoid.  There was obvious radiation injury to the tissue around there.  We dissected down through the skin and subcutaneous tissue between the 2 heads of the sternocleidomastoid identified the internal jugular vein and retracted this laterally.  The common carotid artery was identified and encircled with vessel loop in a Potts configuration proximally as well as umbilical tape.  This time the patient was fully heparinized and ACT returned therapeutic.  A U-stitch was placed directed laterally and the common carotid artery was cannulated between the U-stitch with micropuncture needle followed by a wire to 3 cm and sheath to 3 cm.  Angiography was then performed with RAO imaging and the micropuncture wire was then placed into the external carotid artery and the sheath was advanced and a stiff wire was placed into the external carotid artery and flow reversal sheath was then placed.  Patient was connected to flow reversal and flow reversal was confirmed.  The flow reversal sheath was then affixed to the skin in 2 locations.  A TCAR timeout was performed.  Catheter was used to advance an 014 wire into the ICA distally.  This was then primarily balloon dilated with a 5 mm balloon and then primarily stented with 10 x  40 mm stent.  We did roll the image intensifier identified a stenosis proximally and this was postdilated with 5 mm balloon.  Completion angiography in 2 views demonstrated no dissection and all stenoses were less than 20% residual.  Satisfied with this removed the wire disconnected flow  reversal.  The sheath was removed from the common carotid artery and the U-stitch was cinched.  Doppler confirmed low resistance flow through the common carotid artery.  We then administered 50 mg of protamine.  The left femoral sheath was removed and pressure held until hemostasis was obtained.  We irrigated the wound obtaining stasis in the neck and then closed the layers the platysma with Vicryl the skin with Monocryl.  Dermabond was placed at the skin level.  The patient was then awakened from anesthesia having tolerated the procedure without any complication.  All counts were correct at completion.  Contrast: 20cc  EBL: 50cc  Adryen Cookson C. Randie Heinz, MD [facility] Office: [phone] Pager: [phone]

## 2023-09-14 NOTE — Progress Notes (Signed)
Patient arrived the unit,CHG bath given,Vitals taken,CCMD notified,pt oriented X4, rt neck incision and lt groin incision level 0, NIH scale 0,pt oriented to the unit

## 2023-09-14 NOTE — Interval H&P Note (Signed)
History and Physical Interval Note:  09/14/2023 9:29 AM  Shane Shelton  has presented today for surgery, with the diagnosis of Right Carotid Artery Stenosis.  The various methods of treatment have been discussed with the patient and family. After consideration of risks, benefits and other options for treatment, the patient has consented to  Procedure(s): RIGHT TRANSCAROTID ARTERY REVASCULARIZATION (Right) as a surgical intervention.  The patient's history has been reviewed, patient examined, no change in status, stable for surgery.  I have reviewed the patient's chart and labs.  Questions were answered to the patient's satisfaction.     Lemar Livings

## 2023-09-14 NOTE — Transfer of Care (Signed)
Immediate Anesthesia Transfer of Care Note  Patient: Shane Shelton  Procedure(s) Performed: RIGHT TRANSCAROTID ARTERY REVASCULARIZATION (Right: Neck) ULTRASOUND GUIDANCE FOR VASCULAR ACCESS (Groin)  Patient Location: PACU  Anesthesia Type:General  Level of Consciousness: awake, alert , oriented, and patient cooperative  Airway & Oxygen Therapy: Patient Spontanous Breathing and Patient connected to face mask  Post-op Assessment: Report given to RN, Post -op Vital signs reviewed and stable, Patient moving all extremities, Patient moving all extremities X 4, and Patient able to stick tongue midline  Post vital signs: Reviewed and stable  Last Vitals:  Vitals Value Taken Time  BP 141/72 09/14/23 1147  Temp    Pulse 68 09/14/23 1151  Resp 14 09/14/23 1151  SpO2 91 % 09/14/23 1151  Vitals shown include unfiled device data.  Last Pain:  Vitals:   09/14/23 0832  TempSrc:   PainSc: 0-No pain         Complications: No notable events documented.

## 2023-09-14 NOTE — Anesthesia Procedure Notes (Signed)
Arterial Line Insertion Start/End2/14/2025 9:25 AM, 09/14/2023 9:30 AM Performed by: Randon Goldsmith, CRNA, CRNA  Patient location: Pre-op. Preanesthetic checklist: patient identified, IV checked, site marked, risks and benefits discussed, surgical consent, monitors and equipment checked, pre-op evaluation, timeout performed and anesthesia consent Lidocaine 1% used for infiltration Left, radial was placed Catheter size: 20 G Hand hygiene performed  and maximum sterile barriers used   Attempts: 2 Procedure performed using ultrasound guided technique. Following insertion, dressing applied. Post procedure assessment: normal and unchanged  Patient tolerated the procedure well with no immediate complications.

## 2023-09-14 NOTE — Discharge Instructions (Signed)
[facility]  Discharge Instructions   Carotid Endarterectomy (CEA)  Please refer to the following instructions for your post-procedure care. Your surgeon or physician assistant will discuss any changes with you.  Activity  You are encouraged to walk as much as you can. You can slowly return to normal activities but must avoid strenuous activity and heavy lifting until your doctor tell you it's OK. Avoid activities such as vacuuming or swinging a golf club. You can drive after one week if you are comfortable and you are no longer taking prescription pain medications. It is normal to feel tired for serval weeks after your surgery. It is also normal to have difficulty with sleep habits, eating, and bowel movements after surgery. These will go away with time.  Bathing/Showering  You may shower after you come home. Do not soak in a bathtub, hot tub, or swim until the incision heals completely.  Incision Care  Shower every day. Clean your incision with mild soap and water. Pat the area dry with a clean towel. You do not need a bandage unless otherwise instructed. Do not apply any ointments or creams to your incision. You may have skin glue on your incision. Do not peel it off. It will come off on its own in about one week. Your incision may feel thickened and raised for several weeks after your surgery. This is normal and the skin will soften over time. For Men Only: It's OK to shave around the incision but do not shave the incision itself for 2 weeks. It is common to have numbness under your chin that could last for several months.  Diet  Resume your normal diet. There are no special food restrictions following this procedure. A low fat/low cholesterol diet is recommended for all patients with vascular disease. In order to heal from your surgery, it is CRITICAL to get adequate nutrition. Your body requires vitamins, minerals, and protein. Vegetables are the best  source of vitamins and minerals. Vegetables also provide the perfect balance of protein. Processed food has little nutritional value, so try to avoid this.        Medications  Resume taking all of your medications unless your doctor or physician assistant tells you not to. If your incision is causing pain, you may take over-the- counter pain relievers such as acetaminophen (Tylenol). If you were prescribed a stronger pain medication, please be aware these medications can cause nausea and constipation. Prevent nausea by taking the medication with a snack or meal. Avoid constipation by drinking plenty of fluids and eating foods with a high amount of fiber, such as fruits, vegetables, and grains. Do not take Tylenol if you are taking prescription pain medications.  Follow Up  Our office will schedule a follow up appointment 2-3 weeks following discharge.  Please call us immediately for any of the following conditions  Increased pain, redness, drainage (pus) from your incision site. Fever of 101 degrees or higher. If you should develop stroke (slurred speech, difficulty swallowing, weakness on one side of your body, loss of vision) you should call 911 and go to the nearest emergency room.  Reduce your risk of vascular disease:  Stop smoking. If you would like help call [facility] at 1-800-QUIT-NOW ((910) 853-8047) or [facility] at [phone]. Manage your cholesterol Maintain a desired weight Control your diabetes Keep your blood pressure down  If you have any questions, please call the office at [phone].

## 2023-09-14 NOTE — Anesthesia Procedure Notes (Signed)
Procedure Name: Intubation Date/Time: 09/14/2023 10:38 AM  Performed by: Venia Carbon, CRNAPre-anesthesia Checklist: Patient identified, Emergency Drugs available, Suction available, Patient being monitored and Timeout performed Patient Re-evaluated:Patient Re-evaluated prior to induction Oxygen Delivery Method: Circle system utilized Preoxygenation: Pre-oxygenation with 100% oxygen Induction Type: IV induction Ventilation: Oral airway inserted - appropriate to patient size and Mask ventilation without difficulty Laryngoscope Size: Glidescope and 3 Grade View: Grade I Tube type: Oral Tube size: 7.0 mm Number of attempts: 1 Airway Equipment and Method: Patient positioned with wedge pillow and Video-laryngoscopy Placement Confirmation: ETT inserted through vocal cords under direct vision, positive ETCO2, CO2 detector and breath sounds checked- equal and bilateral Secured at: 22 cm Tube secured with: Tape

## 2023-09-15 LAB — CBC
HCT: 32.3 % — ABNORMAL LOW (ref 39.0–52.0)
Hemoglobin: 11.1 g/dL — ABNORMAL LOW (ref 13.0–17.0)
MCH: 30.7 pg (ref 26.0–34.0)
MCHC: 34.4 g/dL (ref 30.0–36.0)
MCV: 89.5 fL (ref 80.0–100.0)
Platelets: 190 10*3/uL (ref 150–400)
RBC: 3.61 MIL/uL — ABNORMAL LOW (ref 4.22–5.81)
RDW: 13.2 % (ref 11.5–15.5)
WBC: 6.8 10*3/uL (ref 4.0–10.5)
nRBC: 0 % (ref 0.0–0.2)

## 2023-09-15 LAB — BASIC METABOLIC PANEL
Anion gap: 10 (ref 5–15)
BUN: 12 mg/dL (ref 8–23)
CO2: 24 mmol/L (ref 22–32)
Calcium: 8.3 mg/dL — ABNORMAL LOW (ref 8.9–10.3)
Chloride: 103 mmol/L (ref 98–111)
Creatinine, Ser: 0.9 mg/dL (ref 0.61–1.24)
GFR, Estimated: >60 mL/min (ref >60)
Glucose, Bld: 133 mg/dL — ABNORMAL HIGH (ref 70–99)
Potassium: 3.6 mmol/L (ref 3.5–5.1)
Sodium: 137 mmol/L (ref 135–145)

## 2023-09-15 LAB — LIPID PANEL
Cholesterol: 75 mg/dL (ref 0–200)
HDL: 48 mg/dL (ref >40)
LDL Cholesterol: 13 mg/dL (ref 0–99)
Total CHOL/HDL Ratio: 1.6 {ratio}
Triglycerides: 71 mg/dL (ref <150)
VLDL: 14 mg/dL (ref 0–40)

## 2023-09-15 NOTE — Progress Notes (Addendum)
[facility] [place] [facility]  Subjective  - Doing well without new complaints   Objective 121/62 61 98.4 F (36.9 C) (Oral) 12 96%  Intake/Output Summary (Last 24 hours) at 09/15/2023 1610 Last data filed at 09/15/2023 0400 Gross per 24 hour  Intake 1473.82 ml  Output 1125 ml  Net 348.82 ml    Moving all 4 extremities No tongue deviation or facial droop Neck incision healing well without hematoma, groin soft Palpable radial pulses B Lungs non labored breathing  Assessment/Planning: POD # 1 TCAR for asymptomatic ICA stenosis  Tol PO, ambulated, no new neurologic deficits Continue ASA, Plavix and Statin daily F/U in 4 weeks for duplex and incision checks  Mosetta Pigeon 09/15/2023 8:06 AM --  Laboratory Lab Results: Recent Labs    09/14/23 1500 09/15/23 0550  WBC 5.8 6.8  HGB 13.0 11.1*  HCT 38.8* 32.3*  PLT 197 190   BMET Recent Labs    09/14/23 1500 09/15/23 0550  NA  --  137  K  --  3.6  CL  --  103  CO2  --  24  GLUCOSE  --  133*  BUN  --  12  CREATININE 1.24 0.90  CALCIUM  --  8.3*    COAG Lab Results  Component Value Date   INR 1.1 09/10/2023   No results found for: "PTT"  VASCULAR STAFF ADDENDUM: I have independently interviewed and examined the patient. I agree with the above.  No neurologic deficits.  Right neck incision healing well Plan for discharge today, continue aspirin Plavix on discharge 4-week follow-up sent to our office  Daria Pastures MD [facility] Phone Number: [phone] 09/15/2023 9:54 AM

## 2023-09-17 ENCOUNTER — Encounter (HOSPITAL_COMMUNITY): Payer: Self-pay | Admitting: Vascular Surgery

## 2023-09-17 NOTE — Anesthesia Postprocedure Evaluation (Signed)
Anesthesia Post Note  Patient: Shane Shelton  Procedure(s) Performed: RIGHT TRANSCAROTID ARTERY REVASCULARIZATION (Right: Neck) ULTRASOUND GUIDANCE FOR VASCULAR ACCESS (Groin)     Patient location during evaluation: PACU Anesthesia Type: General Level of consciousness: awake and alert Pain management: pain level controlled Vital Signs Assessment: post-procedure vital signs reviewed and stable Respiratory status: spontaneous breathing, nonlabored ventilation, respiratory function stable and patient connected to nasal cannula oxygen Cardiovascular status: blood pressure returned to baseline and stable Postop Assessment: no apparent nausea or vomiting Anesthetic complications: no   No notable events documented.  Last Vitals:  Vitals:   09/15/23 0739 09/15/23 0928  BP: 121/62 (!) 120/57  Pulse: 61 60  Resp: 12 12  Temp: 36.9 C   SpO2: 96% 94%    Last Pain:  Vitals:   09/15/23 0739  TempSrc: Oral  PainSc: 0-No pain                 [name]

## 2023-09-17 NOTE — Discharge Summary (Signed)
[facility] Discharge Summary   Patient ID:  Shane Shelton MRN: [id number] DOB/AGE: [birth year] 78 y.o.  Admit date: 09/14/2023 Discharge date: 07/14/24 Date of Surgery: 09/14/2023 Surgeon: Surgeon(s): Maeola Harman, MD  Admission Diagnosis: Carotid stenosis, asymptomatic [I65.29]  Discharge Diagnoses:  Carotid stenosis, asymptomatic [I65.29]  Secondary Diagnoses: Past Medical History:  Diagnosis Date   Abdominal pain 12/23/2021   Allergy    hayfever   Cancer [facility])    Throat cancer 2011 - Radiation and Chemotherapy   Carotid artery occlusion    Coronary artery disease    Hyperlipidemia    Hypertension    Peripheral vascular disease (HCC)    Carotid Artery Stenosis   Tubular adenoma of colon 11/25/2013   Weight loss 12/23/2021    Procedure(s): RIGHT TRANSCAROTID ARTERY REVASCULARIZATION ULTRASOUND GUIDANCE FOR VASCULAR ACCESS  Discharged Condition: good  HPI: 78 year old male with history of right neck radiation for cancer now with high-grade asymptomatic ICA stenosis with tandem lesions.    Hospital Course:  Shane Shelton is a 78 y.o. male is S/P  Procedure(s): RIGHT TRANSCAROTID ARTERY REVASCULARIZATION ULTRASOUND GUIDANCE FOR VASCULAR ACCESS Uneventful stay over night.   Tol PO, ambulated, no new neurologic deficits Continue ASA, Plavix and Statin daily F/U in 4 weeks for duplex and incision checks  Significant Diagnostic Studies: CBC Lab Results  Component Value Date   WBC 6.8 09/15/2023   HGB 11.1 (L) 09/15/2023   HCT 32.3 (L) 09/15/2023   MCV 89.5 09/15/2023   PLT 190 09/15/2023    BMET    Component Value Date/Time   NA 137 09/15/2023 0550   NA 139 05/15/2022 1107   K 3.6 09/15/2023 0550   CL 103 09/15/2023 0550   CO2 24 09/15/2023 0550   GLUCOSE 133 (H) 09/15/2023 0550   BUN 12 09/15/2023 0550   BUN 17 05/15/2022 1107   CREATININE 0.90 09/15/2023 0550   CALCIUM 8.3 (L) 09/15/2023 0550    GFRNONAA >60 09/15/2023 0550   GFRAA 68 10/01/2019 1535   COAG Lab Results  Component Value Date   INR 1.1 09/10/2023     Disposition:  Discharge to :Home Discharge Instructions     Call MD for:  redness, tenderness, or signs of infection (pain, swelling, bleeding, redness, odor or green/yellow discharge around incision site)   Complete by: As directed    Call MD for:  severe or increased pain, loss or decreased feeling  in affected limb(s)   Complete by: As directed    Call MD for:  temperature >100.5   Complete by: As directed    Increase activity slowly   Complete by: As directed    Walk with assistance use walker or cane as needed   May shower    Complete by: As directed    Resume previous diet   Complete by: As directed       Allergies as of 09/15/2023       Reactions   Ambien [zolpidem] Other (See Comments)   Sleepwalking   Gabapentin Other (See Comments)   Tingling/lightheaded   Beta Adrenergic Blockers    Patient had bradycardia with resting heart rate of 45 on metoprolol succinate 25 mg daily   Statins Other (See Comments)   Tingling and flatulence with atorvastatin and rosuvastatin.         Medication List     TAKE these medications    acetaminophen 500 MG tablet Commonly known as: TYLENOL Take 1,000 mg by mouth every 6 (six)  hours as needed for moderate pain (pain score 4-6).   aspirin EC 81 MG tablet Take 81 mg by mouth every evening.   clopidogrel 75 MG tablet Commonly known as: PLAVIX TAKE 1 TABLET BY MOUTH DAILY   Coenzyme Q-10 200 MG Caps Take 200 mg by mouth every evening.   isosorbide mononitrate 60 MG 24 hr tablet Commonly known as: IMDUR TAKE 1 TABLET BY MOUTH IN THE  EVENING   losartan 25 MG tablet Commonly known as: COZAAR Take 25 mg by mouth daily.   naproxen sodium 220 MG tablet Commonly known as: ALEVE Take 220 mg by mouth 2 (two) times daily as needed (pain.).   nitroGLYCERIN 0.4 MG SL tablet Commonly known as:  NITROSTAT Place 1 tablet (0.4 mg total) under the tongue every 5 (five) minutes as needed for chest pain.   pantoprazole 40 MG tablet Commonly known as: PROTONIX Take 1 tablet (40 mg total) by mouth daily as needed. What changed: when to take this   Repatha SureClick 140 MG/ML Soaj Generic drug: Evolocumab INJECT 140MG  SUBCUTANEOUSLY EVERY 2 WEEKS   rosuvastatin 10 MG tablet Commonly known as: CRESTOR TAKE 1 TABLET BY MOUTH DAILY       Verbal and written Discharge instructions given to the patient. Wound care per Discharge AVS  Follow-up Information     Maeola Harman, MD Follow up in 4 week(s).   Specialties: Vascular Surgery, Cardiology Why: Office will call you to arrange your appt (sent) Contact information: [address] [place] [address] [phone]                 Signed: Mosetta Pigeon 09/17/2023, 8:41 AM --- For VQI Registry use --- Instructions: Press F2 to tab through selections.  Delete question if not applicable.   Modified Rankin score at D/C (0-6): Rankin Score=0  IV medication needed for:  1. Hypertension: No 2. Hypotension: No  Post-op Complications: No  1. Post-op CVA or TIA: No  If yes: Event classification (right eye, left eye, right cortical, left cortical, verterobasilar, other):   If yes: Timing of event (intra-op, <6 hrs post-op, >=6 hrs post-op, unknown):   2. CN injury: No  If yes: CN 0 injuried   3. Myocardial infarction: No  If yes: Dx by (EKG or clinical, Troponin):   4.  CHF: No  5.  Dysrhythmia (new): No  6. Wound infection: No  7. Reperfusion symptoms: No  8. Return to OR: No  If yes: return to OR for (bleeding, neurologic, other CEA incision, other):   Discharge medications: Statin use:  Yes ASA use:  Yes Beta blocker use:  No  for medical reason not indicated ACE-Inhibitor use:  No  for medical reason not indicated P2Y12 Antagonist use: [ ]  None, [x ] Plavix, [ ]  Plasugrel, [ ]   Ticlopinine, [ ]  Ticagrelor, [ ]  Other, [ ]  No for medical reason, [ ]  Non-compliant, [ ]  Not-indicated Anti-coagulant use:  [ ]  None, [ ]  Warfarin, [ ]  Rivaroxaban, [ ]  Dabigatran, [ ]  Other, [ ]  No for medical reason, [ ]  Non-compliant, [ ]  Not-indicated

## 2023-09-18 ENCOUNTER — Encounter: Payer: Self-pay | Admitting: Vascular Surgery

## 2023-09-20 ENCOUNTER — Other Ambulatory Visit: Payer: Self-pay | Admitting: *Deleted

## 2023-09-20 ENCOUNTER — Encounter: Payer: Self-pay | Admitting: Cardiovascular Disease

## 2023-10-15 ENCOUNTER — Other Ambulatory Visit: Payer: Self-pay

## 2023-10-15 DIAGNOSIS — I6521 Occlusion and stenosis of right carotid artery: Secondary | ICD-10-CM

## 2023-10-18 ENCOUNTER — Telehealth: Payer: Self-pay

## 2023-10-18 NOTE — Telephone Encounter (Signed)
Unsuccessful attempt to reach patient on preferred number listed in notes for scheduled AWV. Left message on voicemail okay to reschedule. 

## 2023-10-18 NOTE — Progress Notes (Deleted)
 Subjective:   Shane Shelton is a 78 y.o. who presents for a Medicare Wellness preventive visit.  Visit Complete: {[phone]}  {AWVVIDEO:32072}  Persons Participating in Visit: {Persons Participating in Visit:32444}  AWV Questionnaire: {AWVQuestionnaire:32338}        Objective:    There were no vitals filed for this visit. There is no height or weight on file to calculate BMI.     09/10/2023   10:07 AM 02/16/2023    4:58 PM 12/18/2022    8:49 AM 12/05/2022    2:37 PM 11/30/2022   11:12 AM 09/22/2022    1:59 PM 05/15/2022    8:46 AM  Advanced Directives  Does Patient Have a Medical Advance Directive? Yes Yes Yes No No Yes No  Type of Estate agent of [place];Living will Living will Healthcare Power of [place];Living will   Healthcare Power of [place];Living will   Does patient want to make changes to medical advance directive?      No - Patient declined   Copy of Healthcare Power of Attorney in Chart? Yes - validated most recent copy scanned in chart (See row information)     Yes - validated most recent copy scanned in chart (See row information)   Would patient like information on creating a medical advance directive?     No - Patient declined      Current Medications (verified) Outpatient Encounter Medications as of 10/18/2023  Medication Sig   acetaminophen (TYLENOL) 500 MG tablet Take 1,000 mg by mouth every 6 (six) hours as needed for moderate pain (pain score 4-6).   aspirin EC 81 MG tablet Take 81 mg by mouth every evening.    clopidogrel (PLAVIX) 75 MG tablet TAKE 1 TABLET BY MOUTH DAILY   Coenzyme Q-10 200 MG CAPS Take 200 mg by mouth every evening.    Evolocumab (REPATHA SURECLICK) 140 MG/ML SOAJ INJECT 140MG  SUBCUTANEOUSLY EVERY 2 WEEKS   isosorbide mononitrate (IMDUR) 60 MG 24 hr tablet TAKE 1 TABLET BY MOUTH IN THE  EVENING   losartan (COZAAR) 25 MG tablet Take 25 mg by mouth daily.   naproxen sodium (ALEVE) 220 MG  tablet Take 220 mg by mouth 2 (two) times daily as needed (pain.).   nitroGLYCERIN (NITROSTAT) 0.4 MG SL tablet Place 1 tablet (0.4 mg total) under the tongue every 5 (five) minutes as needed for chest pain.   pantoprazole (PROTONIX) 40 MG tablet Take 1 tablet (40 mg total) by mouth daily as needed. (Patient taking differently: Take 40 mg by mouth daily.)   rosuvastatin (CRESTOR) 10 MG tablet TAKE 1 TABLET BY MOUTH DAILY   No facility-administered encounter medications on file as of 10/18/2023.    Allergies (verified) Ambien [zolpidem], Gabapentin, Beta adrenergic blockers, and Statins   History: Past Medical History:  Diagnosis Date   Abdominal pain 12/23/2021   Allergy    hayfever   Cancer (HCC)    Throat cancer 2011 - Radiation and Chemotherapy   Carotid artery occlusion    Coronary artery disease    Hyperlipidemia    Hypertension    Peripheral vascular disease (HCC)    Carotid Artery Stenosis   Tubular adenoma of colon 11/25/2013   Weight loss 12/23/2021   Past Surgical History:  Procedure Laterality Date   CARDIAC CATHETERIZATION  05/08/2017   S/P S/P PCI, DES of Mid RCA at [facility] in [place]   COLONOSCOPY     multiple   CORONARY PRESSURE/FFR STUDY N/A 10/09/2019  Procedure: INTRAVASCULAR PRESSURE WIRE/FFR STUDY;  Surgeon: Lyn Records, MD;  Location: [facility] INVASIVE CV LAB;  Service: Cardiovascular;  Laterality: N/A;   ESOPHAGOGASTRODUODENOSCOPY     multiple   ESOPHAGOSCOPY W/ PERCUTANEOUS GASTROSTOMY TUBE PLACEMENT  2011   LEFT HEART CATH AND CORONARY ANGIOGRAPHY N/A 10/09/2019   Procedure: LEFT HEART CATH AND CORONARY ANGIOGRAPHY;  Surgeon: Lyn Records, MD;  Location: [facility] INVASIVE CV LAB;  Service: Cardiovascular;  Laterality: N/A;   TRANSCAROTID ARTERY REVASCULARIZATION  Right 09/14/2023   Procedure: RIGHT TRANSCAROTID ARTERY REVASCULARIZATION;  Surgeon: Maeola Harman, MD;  Location: [facility] OR;  Service: Vascular;  Laterality: Right;    ULTRASOUND GUIDANCE FOR VASCULAR ACCESS  09/14/2023   Procedure: ULTRASOUND GUIDANCE FOR VASCULAR ACCESS;  Surgeon: Maeola Harman, MD;  Location: [facility] OR;  Service: Vascular;;   Family History  Problem Relation Age of Onset   Heart disease Mother    Diabetes Mother    Hyperlipidemia Mother    Cancer Father    Heart disease Brother    Cancer Brother    Hyperlipidemia Brother    Colon cancer Neg Hx    Colon polyps Neg Hx    Esophageal cancer Neg Hx    Stomach cancer Neg Hx    Rectal cancer Neg Hx    Social History   Socioeconomic History   Marital status: Married    Spouse name: Byrd Hesselbach   Number of children: 4   Years of education: Not on file   Highest education level: Master's degree (e.g., MA, MS, MEng, MEd, MSW, MBA)  Occupational History   Occupation: Geophysical data processor    Comment: Retired   Occupation: Retired  Tobacco Use   Smoking status: Former    Current packs/day: 0.00    Average packs/day: 1 pack/day for 40.0 years (40.0 ttl pk-yrs)    Types: Cigarettes    Start date: 10/05/1969    Quit date: 10/05/2009    Years since quitting: 14.0   Smokeless tobacco: Never  Vaping Use   Vaping status: Never Used  Substance and Sexual Activity   Alcohol use: Yes    Alcohol/week: 7.0 standard drinks of alcohol    Types: 7 Glasses of wine per week    Comment: 1 drink/day   Drug use: Never   Sexual activity: Yes    Comment: monagamous  Other Topics Concern   Not on file  Social History Narrative   Patient is married with 4 daughters   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   1 alcoholic beverage a day 1 caffeinated beverage daily former smoker no tobacco or drug use now   Social Drivers of Corporate investment banker Strain: Low Risk  (07/25/2023)   Overall Financial Resource Strain (CARDIA)    Difficulty of Paying Living Expenses: Not hard at all  Food  Insecurity: No Food Insecurity (09/15/2023)   Hunger Vital Sign    Worried About Running Out of Food in the Last Year: Never true    Ran Out of Food in the Last Year: Never true  Transportation Needs: No Transportation Needs (09/15/2023)   PRAPARE - Administrator, Civil Service (Medical): No    Lack of Transportation (Non-Medical): No  Physical Activity: Sufficiently Active (07/25/2023)   Exercise Vital Sign    Days of Exercise per Week: 5 days    Minutes of Exercise per Session: 60  min  Stress: No Stress Concern Present (07/25/2023)   [employer] of Occupational Health - Occupational Stress Questionnaire    Feeling of Stress : Not at all  Social Connections: Moderately Integrated (09/15/2023)   Social Connection and Isolation Panel [NHANES]    Frequency of Communication with Friends and Family: More than three times a week    Frequency of Social Gatherings with Friends and Family: Twice a week    Attends Religious Services: Never    Database administrator or Organizations: Yes    Attends Engineer, structural: More than 4 times per year    Marital Status: Married    Tobacco Counseling Counseling given: Not Answered    Clinical Intake:              Lab Results  Component Value Date   HGBA1C 6.3 (H) 05/21/2023   HGBA1C 6.0 05/15/2022   HGBA1C 5.9 12/22/2021               Activities of Daily Living ***    10/14/2023    3:51 PM 09/10/2023   10:10 AM  In your present state of health, do you have any difficulty performing the following activities:  Hearing? 0   Vision? 0   Difficulty concentrating or making decisions? 0   Walking or climbing stairs? 0   Dressing or bathing? 0   Doing errands, shopping? 0 0  Preparing Food and eating ? N   Using the Toilet? N   In the past six months, have you accidently leaked urine? N   Do you have problems with loss of bowel control? N   Managing your Medications? N   Managing your Finances? N    Housekeeping or managing your Housekeeping? N     Patient Care Team: [name], DO as PCP - General (Family Medicine) Corky Crafts, MD as PCP - Cardiology (Cardiology)  Indicate any recent Medical Services you may have received from other than [facility] providers in the past year (date may be approximate).     Assessment:   This is a routine wellness examination for [place].  Hearing/Vision screen No results found.   Goals Addressed   None    Depression Screen ***    02/27/2023    9:23 AM 02/16/2023    3:13 PM 12/18/2022    9:48 AM 12/05/2022    2:36 PM 11/30/2022   11:11 AM 09/22/2022    1:34 PM 05/15/2022    8:54 AM  PHQ 2/9 Scores  PHQ - 2 Score 0 0 0  0 0 0  PHQ- 9 Score 0 0 0  0  0  Exception Documentation    Patient refusal       Fall Risk ***    10/14/2023    3:51 PM 05/21/2023    8:52 AM 02/27/2023    9:24 AM 02/16/2023    3:13 PM 12/18/2022    9:48 AM  Fall Risk   Falls in the past year? 0 0 0 0 0  Number falls in past yr:   0 0 0  Injury with Fall?   0 0 0  Risk for fall due to :  No Fall Risks No Fall Risks      MEDICARE RISK AT HOME: *** Medicare Risk at Home Life alert?: (Patient-Rptd) No Use of a cane, walker or w/c?: (Patient-Rptd) No Grab bars in the bathroom?: (Patient-Rptd) No Shower chair or bench in shower?: (Patient-Rptd) No Elevated toilet seat  or a handicapped toilet?: (Patient-Rptd) No  TIMED UP AND GO:  Was the test performed?  {[phone]}  Cognitive Function: {CognitiveScreening:32337}        09/22/2022    1:35 PM  6CIT Screen  What Year? 0 points  What month? 0 points  What time? 0 points  Count back from 20 0 points  Months in reverse 0 points  Repeat phrase 0 points  Total Score 0 points    Immunizations Immunization History  Administered Date(s) Administered   Fluad Quad(high Dose 65+) 04/12/2020, 04/21/2021, 05/15/2022   Influenza, Seasonal, Injecte, Preservative Fre 08/07/2011, 07/04/2012    Influenza,inj,Quad PF,6+ Mos 04/04/2017, 04/22/2018, 03/27/2019   Influenza-Unspecified 04/19/2023   Moderna SARS-COV2 Booster Vaccination 05/27/2020   Moderna Sars-Covid-2 Vaccination 08/22/2019, 09/26/2019   Pneumococcal Conjugate-13 07/08/2014   Pneumococcal Polysaccharide-23 05/16/2016   Tdap 10/17/2011   Unspecified SARS-COV-2 Vaccination 07/04/2022, 04/19/2023    Screening Tests Health Maintenance  Topic Date Due   Zoster Vaccines- Shingrix (1 of 2) Never done   DTaP/Tdap/Td (2 - Td or Tdap) 10/16/2021   OPHTHALMOLOGY EXAM  07/08/2023   Medicare Annual Wellness (AWV)  09/23/2023   COVID-19 Vaccine (5 - Mixed Product risk 2024-25 season) 10/17/2023   HEMOGLOBIN A1C  11/19/2023   Diabetic kidney evaluation - Urine ACR  05/20/2024   FOOT EXAM  05/20/2024   Lung Cancer Screening  06/05/2024   Diabetic kidney evaluation - eGFR measurement  09/14/2024   Colonoscopy  09/15/2026   Pneumonia Vaccine 40+ Years old  Completed   INFLUENZA VACCINE  Completed   Hepatitis C Screening  Completed   HPV VACCINES  Aged Out    Health Maintenance  Health Maintenance Due  Topic Date Due   Zoster Vaccines- Shingrix (1 of 2) Never done   DTaP/Tdap/Td (2 - Td or Tdap) 10/16/2021   OPHTHALMOLOGY EXAM  07/08/2023   Medicare Annual Wellness (AWV)  09/23/2023   COVID-19 Vaccine (5 - Mixed Product risk 2024-25 season) 10/17/2023   Health Maintenance Items Addressed: {HMMCR (Optional):30011}  Additional Screening:  Vision Screening: Recommended annual ophthalmology exams for early detection of glaucoma and other disorders of the eye.  Dental Screening: Recommended annual dental exams for proper oral hygiene  Community Resource Referral / Chronic Care Management: CRR required this visit?  {YES/NO:21197}  CCM required this visit?  {CCM Required choices:[phone]}     Plan:     I have personally reviewed and noted the following in the patient's chart:   Medical and social  history Use of alcohol, tobacco or illicit drugs  Current medications and supplements including opioid prescriptions. {Opioid Prescriptions:[phone]} Functional ability and status Nutritional status Physical activity Advanced directives List of other physicians Hospitalizations, surgeries, and ER visits in previous 12 months Vitals Screenings to include cognitive, depression, and falls Referrals and appointments  In addition, I have reviewed and discussed with patient certain preventive protocols, quality metrics, and best practice recommendations. A written personalized care plan for preventive services as well as general preventive health recommendations were provided to patient.     Tillie Rung, LPN   5/78/4696   After Visit Summary: {CHL AMB AWV After Visit Summary:[phone]}  Notes: {Nurse Notes:32343}

## 2023-10-21 ENCOUNTER — Other Ambulatory Visit: Payer: Self-pay | Admitting: Interventional Cardiology

## 2023-10-24 ENCOUNTER — Ambulatory Visit (INDEPENDENT_AMBULATORY_CARE_PROVIDER_SITE_OTHER): Payer: Medicare Other | Admitting: Physician Assistant

## 2023-10-24 ENCOUNTER — Ambulatory Visit (HOSPITAL_COMMUNITY)
Admission: RE | Admit: 2023-10-24 | Discharge: 2023-10-24 | Disposition: A | Discharge status: Home / Self Care | Payer: Medicare Other | Source: Ambulatory Visit | Attending: Vascular Surgery | Admitting: Vascular Surgery

## 2023-10-24 VITALS — BP 147/78 | HR 52 | Temp 97.3°F | Ht 73.0 in | Wt 209.1 lb

## 2023-10-24 DIAGNOSIS — I6521 Occlusion and stenosis of right carotid artery: Secondary | ICD-10-CM

## 2023-10-24 NOTE — Progress Notes (Signed)
 POST OPERATIVE OFFICE NOTE    CC:  F/u for surgery  HPI:  Shane Shelton is a 78 y.o. male who is here for a postop visit.  He recently underwent right TCAR on 09/14/2023 by Dr. Randie Heinz.  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  He does have a history of neck radiation for previous throat cancer.  Pt returns today for follow up.  Pt states he has been doing well since surgery.  He has a minimal amount of soreness remaining in his incision site.  He denies any drainage or redness to his incision.  He denies any neurological symptoms such as slurred speech, facial droop, weakness or numbness.  He states that his blood pressure has been much more controlled since carotid stenting.  He remains on aspirin, Plavix, and Repatha.   Allergies  Allergen Reactions   Ambien [Zolpidem] Other (See Comments)    Sleepwalking   Gabapentin Other (See Comments)    Tingling/lightheaded   Beta Adrenergic Blockers     Patient had bradycardia with resting heart rate of 45 on metoprolol succinate 25 mg daily   Statins Other (See Comments)    Tingling and flatulence with atorvastatin and rosuvastatin.     Current Outpatient Medications  Medication Sig Dispense Refill   acetaminophen (TYLENOL) 500 MG tablet Take 1,000 mg by mouth every 6 (six) hours as needed for moderate pain (pain score 4-6).     aspirin EC 81 MG tablet Take 81 mg by mouth every evening.      clopidogrel (PLAVIX) 75 MG tablet TAKE 1 TABLET BY MOUTH DAILY 100 tablet 2   Coenzyme Q-10 200 MG CAPS Take 200 mg by mouth every evening.      Evolocumab (REPATHA SURECLICK) 140 MG/ML SOAJ INJECT 140MG  SUBCUTANEOUSLY EVERY 2 WEEKS 6 mL 3   isosorbide mononitrate (IMDUR) 60 MG 24 hr tablet TAKE 1 TABLET BY MOUTH IN THE  EVENING 30 tablet 11   losartan (COZAAR) 25 MG tablet Take 25 mg by mouth daily.     naproxen sodium (ALEVE) 220 MG tablet Take 220 mg by mouth 2 (two) times daily as needed (pain.).     nitroGLYCERIN  (NITROSTAT) 0.4 MG SL tablet Place 1 tablet (0.4 mg total) under the tongue every 5 (five) minutes as needed for chest pain. 20 tablet 0   pantoprazole (PROTONIX) 40 MG tablet Take 1 tablet (40 mg total) by mouth daily as needed. (Patient taking differently: Take 40 mg by mouth daily.) 90 tablet 3   rosuvastatin (CRESTOR) 10 MG tablet TAKE 1 TABLET BY MOUTH DAILY 90 tablet 3   No current facility-administered medications for this visit.     ROS:  See HPI  Physical Exam:  Incision: Right sided neck incision clean, dry, intact.  Incision is well-healed without signs of infection Extremities: Palpable radial pulses, moving all extremities equally Neuro: No slurred speech, facial droop, or tongue deviation  Studies: Carotid Duplex (10/24/2023) Patent right carotid stent without stenosis.  Unchanged 1 to 39% stenosis of the left ICA    Assessment/Plan:  This is a 78 y.o. male who is here for postop check  -The patient recently underwent right TCAR for asymptomatic critical right ICA stenosis -His right carotid stent is patent without stenosis.  He has unchanged 1 to 39% stenosis of his left ICA -He denies any neurological symptoms since surgery such as slurred speech, facial droop, vision changes, or sudden weakness/numbness -He is moving all extremities equally.  His right sided neck incision is well-healed.  He has palpable and equal radial pulses -Recommend that he continue aspirin, Plavix, and Repatha.  He can follow-up with our office in 9 months with repeat carotid duplex   Loel Dubonnet, PA-C [facility] [phone]   Clinic MD:  Randie Heinz

## 2023-11-15 ENCOUNTER — Other Ambulatory Visit: Payer: Self-pay | Admitting: Interventional Cardiology

## 2023-11-15 DIAGNOSIS — E7800 Pure hypercholesterolemia, unspecified: Secondary | ICD-10-CM

## 2024-01-03 ENCOUNTER — Other Ambulatory Visit: Payer: Self-pay | Admitting: Interventional Cardiology

## 2024-01-07 ENCOUNTER — Ambulatory Visit

## 2024-01-07 ENCOUNTER — Encounter

## 2024-01-07 VITALS — Ht 73.0 in | Wt 205.0 lb

## 2024-01-07 DIAGNOSIS — Z0000 Encounter for general adult medical examination without abnormal findings: Secondary | ICD-10-CM | POA: Diagnosis not present

## 2024-01-07 NOTE — Progress Notes (Signed)
 Because this visit was a virtual/telehealth visit,  certain criteria was not obtained, such a blood pressure, CBG if applicable, and timed get up and go. Any medications not marked as "taking" were not mentioned during the medication reconciliation part of the visit. Any vitals not documented were not able to be obtained due to this being a telehealth visit or patient was unable to self-report a recent blood pressure reading due to a lack of equipment at home via telehealth. Vitals that have been documented are verbally provided by the patient.   Subjective:   Shane Shelton is a 78 y.o. who presents for a Medicare Wellness preventive visit.  As a reminder, Annual Wellness Visits don't include a physical exam, and some assessments may be limited, especially if this visit is performed virtually. We may recommend an in-person follow-up visit with your provider if needed.  Visit Complete: Virtual I connected with  Shane Shelton on 01/07/24 by a audio enabled telemedicine application and verified that I am speaking with the correct person using two identifiers.  Patient Location: Home  Provider Location: Office/Clinic  I discussed the limitations of evaluation and management by telemedicine. The patient expressed understanding and agreed to proceed.  Vital Signs: Because this visit was a virtual/telehealth visit, some criteria may be missing or patient reported. Any vitals not documented were not able to be obtained and vitals that have been documented are patient reported.  VideoDeclined- This patient declined Librarian, academic. Therefore the visit was completed with audio only.  Persons Participating in Visit: Patient.  AWV Questionnaire: Yes: Patient Medicare AWV questionnaire was completed by the patient on 01/06/2024; I have confirmed that all information answered by patient is correct and no changes since this date.  Cardiac Risk Factors  include: advanced age (>11men, >23 women);dyslipidemia;family history of premature cardiovascular disease;hypertension;male gender     Objective:     Today's Vitals   01/07/24 1308  Weight: 205 lb (93 kg)  Height: 6\' 1"  (1.854 m)  PainSc: 0-No pain   Body mass index is 27.05 kg/m.     01/07/2024    1:06 PM 09/10/2023   10:07 AM 02/16/2023    4:58 PM 12/18/2022    8:49 AM 12/05/2022    2:37 PM 11/30/2022   11:12 AM 09/22/2022    1:59 PM  Advanced Directives  Does Patient Have a Medical Advance Directive? Yes Yes Yes Yes No No Yes  Type of Estate agent of [place];Living will Healthcare Power of [place];Living will Living will Healthcare Power of [place];Living will   Healthcare Power of [place];Living will  Does patient want to make changes to medical advance directive? No - Patient declined      No - Patient declined  Copy of Healthcare Power of Attorney in Chart? Yes - validated most recent copy scanned in chart (See row information) Yes - validated most recent copy scanned in chart (See row information)     Yes - validated most recent copy scanned in chart (See row information)  Would patient like information on creating a medical advance directive?      No - Patient declined     Current Medications (verified) Outpatient Encounter Medications as of 01/07/2024  Medication Sig   acetaminophen  (TYLENOL ) 500 MG tablet Take 1,000 mg by mouth every 6 (six) hours as needed for moderate pain (pain score 4-6).   aspirin  EC 81 MG tablet Take 81 mg by mouth every evening.    clopidogrel  (  PLAVIX ) 75 MG tablet TAKE 1 TABLET BY MOUTH DAILY   Coenzyme Q-10 200 MG CAPS Take 200 mg by mouth every evening.    Evolocumab  (REPATHA  SURECLICK) 140 MG/ML SOAJ INJECT 1 PEN SUBCUTANEOUSLY  EVERY 2 WEEKS   isosorbide  mononitrate (IMDUR ) 60 MG 24 hr tablet TAKE 1 TABLET BY MOUTH IN THE  EVENING   losartan  (COZAAR ) 25 MG tablet Take 25 mg by mouth daily.   naproxen  sodium (ALEVE ) 220 MG  tablet Take 220 mg by mouth 2 (two) times daily as needed (pain.).   nitroGLYCERIN  (NITROSTAT ) 0.4 MG SL tablet Place 1 tablet (0.4 mg total) under the tongue every 5 (five) minutes as needed for chest pain.   pantoprazole  (PROTONIX ) 40 MG tablet Take 1 tablet (40 mg total) by mouth daily as needed. (Patient taking differently: Take 40 mg by mouth daily.)   rosuvastatin  (CRESTOR ) 10 MG tablet TAKE 1 TABLET BY MOUTH DAILY   No facility-administered encounter medications on file as of 01/07/2024.    Allergies (verified) Ambien [zolpidem], Gabapentin , Beta adrenergic blockers, and Statins   History: Past Medical History:  Diagnosis Date   Abdominal pain 12/23/2021   Allergy    hayfever   Cancer (HCC)    Throat cancer 2011 - Radiation and Chemotherapy   Carotid artery occlusion    Coronary artery disease    Hyperlipidemia    Hypertension    Peripheral vascular disease (HCC)    Carotid Artery Stenosis   Tubular adenoma of colon 11/25/2013   Weight loss 12/23/2021   Past Surgical History:  Procedure Laterality Date   CARDIAC CATHETERIZATION  05/08/2017   S/P S/P PCI, DES of Mid RCA at [facility] in [place]   COLONOSCOPY     multiple   CORONARY PRESSURE/FFR STUDY N/A 10/09/2019   Procedure: INTRAVASCULAR PRESSURE WIRE/FFR STUDY;  Surgeon: Arty Binning, MD;  Location: [facility];  Service: Cardiovascular;  Laterality: N/A;   ESOPHAGOGASTRODUODENOSCOPY     multiple   ESOPHAGOSCOPY W/ PERCUTANEOUS GASTROSTOMY TUBE PLACEMENT  2011   LEFT HEART CATH AND CORONARY ANGIOGRAPHY N/A 10/09/2019   Procedure: LEFT HEART CATH AND CORONARY ANGIOGRAPHY;  Surgeon: Arty Binning, MD;  Location: [facility];  Service: Cardiovascular;  Laterality: N/A;   TRANSCAROTID ARTERY REVASCULARIZATION  Right 09/14/2023   Procedure: RIGHT TRANSCAROTID ARTERY REVASCULARIZATION;  Surgeon: Adine Hoof, MD;  Location: [facility] OR;  Service: Vascular;  Laterality: Right;    ULTRASOUND GUIDANCE FOR VASCULAR ACCESS  09/14/2023   Procedure: ULTRASOUND GUIDANCE FOR VASCULAR ACCESS;  Surgeon: Adine Hoof, MD;  Location: [facility] OR;  Service: Vascular;;   Family History  Problem Relation Age of Onset   Heart disease Mother    Diabetes Mother    Hyperlipidemia Mother    Cancer Father    Heart disease Brother    Cancer Brother    Hyperlipidemia Brother    Colon cancer Neg Hx    Colon polyps Neg Hx    Esophageal cancer Neg Hx    Stomach cancer Neg Hx    Rectal cancer Neg Hx    Social History   Socioeconomic History   Marital status: Married    Spouse name: Shelagh Derrick   Number of children: 4   Years of education: Not on file   Highest education level: Master's degree (e.g., MA, MS, MEng, MEd, MSW, MBA)  Occupational History   Occupation: Geophysical data processor    Comment: Retired   Occupation: Retired  Tobacco Use  Smoking status: Former    Current packs/day: 0.00    Average packs/day: 1 pack/day for 40.0 years (40.0 ttl pk-yrs)    Types: Cigarettes    Start date: 10/05/1969    Quit date: 10/05/2009    Years since quitting: 14.2   Smokeless tobacco: Never  Vaping Use   Vaping status: Never Used  Substance and Sexual Activity   Alcohol use: Yes    Alcohol/week: 7.0 standard drinks of alcohol    Types: 7 Glasses of wine per week    Comment: 1 drink/day   Drug use: Never   Sexual activity: Yes    Comment: monagamous  Other Topics Concern   Not on file  Social History Narrative   Patient is married with 4 daughters   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   1 alcoholic beverage a day 1 caffeinated beverage daily former smoker no tobacco or drug use now   Social Drivers of Corporate investment banker Strain: Low Risk  (01/07/2024)   Overall Financial Resource Strain (CARDIA)    Difficulty of Paying Living Expenses: Not hard at all  Food  Insecurity: No Food Insecurity (01/07/2024)   Hunger Vital Sign    Worried About Running Out of Food in the Last Year: Never true    Ran Out of Food in the Last Year: Never true  Transportation Needs: No Transportation Needs (01/07/2024)   PRAPARE - Administrator, Civil Service (Medical): No    Lack of Transportation (Non-Medical): No  Physical Activity: Sufficiently Active (01/07/2024)   Exercise Vital Sign    Days of Exercise per Week: 5 days    Minutes of Exercise per Session: 60 min  Stress: No Stress Concern Present (01/07/2024)   [employer] of Occupational Health - Occupational Stress Questionnaire    Feeling of Stress : Not at all  Social Connections: Moderately Integrated (01/07/2024)   Social Connection and Isolation Panel [NHANES]    Frequency of Communication with Friends and Family: More than three times a week    Frequency of Social Gatherings with Friends and Family: Twice a week    Attends Religious Services: Never    Database administrator or Organizations: Yes    Attends Engineer, structural: More than 4 times per year    Marital Status: Married    Tobacco Counseling Counseling given: Not Answered    Clinical Intake:  Pre-visit preparation completed: Yes  Pain : No/denies pain Pain Score: 0-No pain     BMI - recorded: 27.05 Nutritional Status: BMI 25 -29 Overweight Nutritional Risks: None Diabetes: No  Lab Results  Component Value Date   HGBA1C 6.3 (H) 05/21/2023   HGBA1C 6.0 05/15/2022   HGBA1C 5.9 12/22/2021     How often do you need to have someone help you when you read instructions, pamphlets, or other written materials from your doctor or pharmacy?: 1 - Never What is the last grade level you completed in school?: College Graduate  Interpreter Needed?: No  Information entered by :: Joplin Canty N. Karston Hyland, LPN.   Activities of Daily Living     01/06/2024    3:51 PM 10/14/2023    3:51 PM  In your present state of health,  do you have any difficulty performing the following activities:  Hearing? 0 0  Vision? 0 0  Difficulty concentrating or making decisions? 0 0  Walking or climbing stairs? 0 0  Dressing or bathing? 0 0  Doing errands, shopping? 0 0  Preparing Food and eating ? N N  Using the Toilet? N N  In the past six months, have you accidently leaked urine? N N  Do you have problems with loss of bowel control? N N  Managing your Medications? N N  Managing your Finances? N N  Housekeeping or managing your Housekeeping? N N    Patient Care Team: Kandis Ormond, DO as PCP - General (Family Medicine) Lucendia Rusk, MD as PCP - Cardiology (Cardiology) Devin Foerster, MD as Consulting Physician (Ophthalmology)  I have updated your Care Teams any recent Medical Services you may have received from other providers in the past year.     Assessment:    This is a routine wellness examination for [place].  Hearing/Vision screen Hearing Screening - Comments:: Denies hearing difficulties.  Vision Screening - Comments:: Wears rx glasses - up to date with [facility] eye exams with [facility]    Goals Addressed             This Visit's Progress    01/07/2024: My goal is to not any medical events.  To stay active, fit and engaged.         Depression Screen     01/07/2024    1:07 PM 02/27/2023    9:23 AM 02/16/2023    3:13 PM 12/18/2022    9:48 AM 12/05/2022    2:36 PM 11/30/2022   11:11 AM 09/22/2022    1:34 PM  PHQ 2/9 Scores  PHQ - 2 Score 0 0 0 0  0 0  PHQ- 9 Score 0 0 0 0  0   Exception Documentation     Patient refusal      Fall Risk     01/07/2024    1:07 PM 01/06/2024    3:51 PM 10/14/2023    3:51 PM 05/21/2023    8:52 AM 02/27/2023    9:24 AM  Fall Risk   Falls in the past year? 0 0 0 0 0  Number falls in past yr: 0    0  Injury with Fall? 0    0  Risk for fall due to : No Fall Risks   No Fall Risks No Fall Risks  Follow up Falls evaluation completed        MEDICARE  RISK AT HOME:  Medicare Risk at Home Any stairs in or around the home?: Yes If so, are there any without handrails?: No Home free of loose throw rugs in walkways, pet beds, electrical cords, etc?: Yes Adequate lighting in your home to reduce risk of falls?: No Life alert?: No Use of a cane, walker or w/c?: No Grab bars in the bathroom?: No Shower chair or bench in shower?: No Elevated toilet seat or a handicapped toilet?: No  TIMED UP AND GO:  Was the test performed?  No  Cognitive Function: 6CIT completed    01/07/2024    1:08 PM  MMSE - Mini Mental State Exam  Not completed: Unable to complete        01/07/2024    1:15 PM 09/22/2022    1:35 PM  6CIT Screen  What Year? 0 points 0 points  What month? 0 points 0 points  What time? 0 points 0 points  Count back from 20 0 points 0 points  Months in reverse 0 points 0 points  Repeat phrase 0  points 0 points  Total Score 0 points 0 points    Immunizations Immunization History  Administered Date(s) Administered   Fluad Quad(high Dose 65+) 04/12/2020, 04/21/2021, 05/15/2022   Influenza, Seasonal, Injecte, Preservative Fre 08/07/2011, 07/04/2012   Influenza,inj,Quad PF,6+ Mos 04/04/2017, 04/22/2018, 03/27/2019   Influenza-Unspecified 04/19/2023   Moderna SARS-COV2 Booster Vaccination 05/27/2020   Moderna Sars-Covid-2 Vaccination 08/22/2019, 09/26/2019   Pneumococcal Conjugate-13 07/08/2014   Pneumococcal Polysaccharide-23 05/16/2016   Tdap 10/17/2011   Unspecified SARS-COV-2 Vaccination 07/04/2022, 04/19/2023    Screening Tests Health Maintenance  Topic Date Due   Zoster Vaccines- Shingrix (1 of 2) Never done   DTaP/Tdap/Td (2 - Td or Tdap) 10/16/2021   OPHTHALMOLOGY EXAM  07/08/2023   COVID-19 Vaccine (5 - Mixed Product risk 2024-25 season) 10/17/2023   HEMOGLOBIN A1C  11/19/2023   INFLUENZA VACCINE  02/29/2024   Diabetic kidney evaluation - Urine ACR  05/20/2024   FOOT EXAM  05/20/2024   Lung Cancer Screening   06/05/2024   Diabetic kidney evaluation - eGFR measurement  09/14/2024   Medicare Annual Wellness (AWV)  01/06/2025   Colonoscopy  09/15/2026   Pneumonia Vaccine 74+ Years old  Completed   Hepatitis C Screening  Completed   HPV VACCINES  Aged Out   Meningococcal B Vaccine  Aged Out    Health Maintenance  Health Maintenance Due  Topic Date Due   Zoster Vaccines- Shingrix (1 of 2) Never done   DTaP/Tdap/Td (2 - Td or Tdap) 10/16/2021   OPHTHALMOLOGY EXAM  07/08/2023   COVID-19 Vaccine (5 - Mixed Product risk 2024-25 season) 10/17/2023   HEMOGLOBIN A1C  11/19/2023   Health Maintenance Items Addressed: Yes Patient aware of current care gaps.    Additional Screening:  Vision Screening: Recommended annual ophthalmology exams for early detection of glaucoma and other disorders of the eye. Would you like a referral to an eye doctor? No    Dental Screening: Recommended annual dental exams for proper oral hygiene  Community Resource Referral / Chronic Care Management: CRR required this visit?  No   CCM required this visit?  No   Plan:    I have personally reviewed and noted the following in the patient's chart:   Medical and social history Use of alcohol, tobacco or illicit drugs  Current medications and supplements including opioid prescriptions. Patient is not currently taking opioid prescriptions. Functional ability and status Nutritional status Physical activity Advanced directives List of other physicians Hospitalizations, surgeries, and ER visits in previous 12 months Vitals Screenings to include cognitive, depression, and falls Referrals and appointments  In addition, I have reviewed and discussed with patient certain preventive protocols, quality metrics, and best practice recommendations. A written personalized care plan for preventive services as well as general preventive health recommendations were provided to patient.   Margette Sheldon, LPN   02/28/1912    After Visit Summary: (MyChart) Due to this being a telephonic visit, the after visit summary with patients personalized plan was offered to patient via MyChart   Notes: Patient aware of current care gaps.  [facility] was verified for any new vaccinations. This nurse requested medical records from [facility].

## 2024-01-07 NOTE — Patient Instructions (Signed)
 Mr. Bramer , Thank you for taking time out of your busy schedule to complete your Annual Wellness Visit with me. I enjoyed our conversation and look forward to speaking with you again next year. I, as well as your care team,  appreciate your ongoing commitment to your health goals. Please review the following plan we discussed and let me know if I can assist you in the future. Your Game plan/ To Do List    Referrals: If you haven't heard from the office you've been referred to, please reach out to them at the phone provided.   Follow up Visits: Next Medicare AWV with our clinical staff: 01/08/2025 at 1:30 pm Phone Visit with Nurse Health Advisor   Have you seen your provider in the last 6 months (3 months if uncontrolled diabetes)? No Next Office Visit with your provider: Patient will call to schedule.  Clinician Recommendations:  Aim for 30 minutes of exercise or brisk walking, 6-8 glasses of water, and 5 servings of fruits and vegetables each day.       This is a list of the screening recommended for you and due dates:  Health Maintenance  Topic Date Due   Zoster (Shingles) Vaccine (1 of 2) Never done   DTaP/Tdap/Td vaccine (2 - Td or Tdap) 10/16/2021   Eye exam for diabetics  07/08/2023   COVID-19 Vaccine (5 - Mixed Product risk 2024-25 season) 10/17/2023   Hemoglobin A1C  11/19/2023   Flu Shot  02/29/2024   Yearly kidney health urinalysis for diabetes  05/20/2024   Complete foot exam   05/20/2024   Screening for Lung Cancer  06/05/2024   Yearly kidney function blood test for diabetes  09/14/2024   Medicare Annual Wellness Visit  01/06/2025   Colon Cancer Screening  09/15/2026   Pneumonia Vaccine  Completed   Hepatitis C Screening  Completed   HPV Vaccine  Aged Out   Meningitis B Vaccine  Aged Out    Advanced directives: (In Chart) A copy of your advanced directives are scanned into your chart should your provider ever need it. Advance Care Planning is important because  it:  [x]  Makes sure you receive the medical care that is consistent with your values, goals, and preferences  [x]  It provides guidance to your family and loved ones and reduces their decisional burden about whether or not they are making the right decisions based on your wishes.  Follow the link provided in your after visit summary or read over the paperwork we have mailed to you to help you started getting your Advance Directives in place. If you need assistance in completing these, please reach out to us  so that we can help you!  See attachments for Preventive Care and Fall Prevention Tips.

## 2024-01-21 ENCOUNTER — Encounter: Payer: Self-pay | Admitting: Family Medicine

## 2024-01-22 ENCOUNTER — Other Ambulatory Visit: Payer: Self-pay

## 2024-01-22 MED ORDER — NITROGLYCERIN 0.4 MG SL SUBL
0.4000 mg | SUBLINGUAL_TABLET | SUBLINGUAL | 1 refills | Status: AC | PRN
Start: 2024-01-22 — End: ?

## 2024-01-28 ENCOUNTER — Other Ambulatory Visit: Payer: Self-pay

## 2024-01-28 MED ORDER — LOSARTAN POTASSIUM 25 MG PO TABS: 25.0000 mg | ORAL_TABLET | Freq: Every day | ORAL | 1 refills | Status: DC

## 2024-01-29 DIAGNOSIS — L814 Other melanin hyperpigmentation: Secondary | ICD-10-CM | POA: Diagnosis not present

## 2024-01-29 DIAGNOSIS — Z85828 Personal history of other malignant neoplasm of skin: Secondary | ICD-10-CM | POA: Diagnosis not present

## 2024-01-29 DIAGNOSIS — L720 Epidermal cyst: Secondary | ICD-10-CM | POA: Diagnosis not present

## 2024-01-29 DIAGNOSIS — D225 Melanocytic nevi of trunk: Secondary | ICD-10-CM | POA: Diagnosis not present

## 2024-01-29 DIAGNOSIS — D692 Other nonthrombocytopenic purpura: Secondary | ICD-10-CM | POA: Diagnosis not present

## 2024-01-29 DIAGNOSIS — D1801 Hemangioma of skin and subcutaneous tissue: Secondary | ICD-10-CM | POA: Diagnosis not present

## 2024-01-29 DIAGNOSIS — L821 Other seborrheic keratosis: Secondary | ICD-10-CM | POA: Diagnosis not present

## 2024-01-29 DIAGNOSIS — D2239 Melanocytic nevi of other parts of face: Secondary | ICD-10-CM | POA: Diagnosis not present

## 2024-01-29 DIAGNOSIS — D2261 Melanocytic nevi of right upper limb, including shoulder: Secondary | ICD-10-CM | POA: Diagnosis not present

## 2024-02-02 ENCOUNTER — Other Ambulatory Visit: Payer: Self-pay | Admitting: Family Medicine

## 2024-03-31 ENCOUNTER — Other Ambulatory Visit: Payer: Self-pay | Admitting: Cardiovascular Disease

## 2024-04-02 ENCOUNTER — Other Ambulatory Visit: Payer: Self-pay | Admitting: Interventional Cardiology

## 2024-04-02 ENCOUNTER — Other Ambulatory Visit: Payer: Self-pay | Admitting: Cardiovascular Disease

## 2024-04-03 ENCOUNTER — Telehealth: Payer: Self-pay | Admitting: Cardiovascular Disease

## 2024-04-03 MED ORDER — LOSARTAN POTASSIUM 25 MG PO TABS: 25.0000 mg | ORAL_TABLET | Freq: Every day | ORAL | 1 refills | Status: DC

## 2024-04-03 NOTE — Telephone Encounter (Signed)
*  STAT* If patient is at the pharmacy, call can be transferred to refill team.   1. Which medications need to be refilled? (please list name of each medication and dose if known)  losartan  (COZAAR ) 25 MG tablet  2. Which pharmacy/location (including street and city if local pharmacy) is medication to be sent to? [facility] - [place], [place] - [address]  3. Do they need a 30 day or 90 day supply?   90 day supply  Per patient, [facility] informed him they requested the medication, but it was denied. Looks like the last prescription had an additional refill on it. Unsure why it is being denied. Patient would like to know if someone can look into this. He still has medication, but he's on his last few weeks to 1 month.

## 2024-04-03 NOTE — Telephone Encounter (Signed)
 Pt's medication was sent to pt's pharmacy as requested. Confirmation received.

## 2024-04-08 ENCOUNTER — Encounter: Payer: Self-pay | Admitting: Family Medicine

## 2024-04-09 ENCOUNTER — Ambulatory Visit: Admitting: Cardiovascular Disease

## 2024-05-14 ENCOUNTER — Encounter: Payer: Self-pay | Admitting: Cardiovascular Disease

## 2024-05-14 ENCOUNTER — Ambulatory Visit: Attending: Cardiovascular Disease | Admitting: Cardiovascular Disease

## 2024-05-14 VITALS — BP 120/62 | HR 73 | Ht 74.0 in | Wt 209.0 lb

## 2024-05-14 DIAGNOSIS — E7800 Pure hypercholesterolemia, unspecified: Secondary | ICD-10-CM | POA: Diagnosis not present

## 2024-05-14 DIAGNOSIS — I150 Renovascular hypertension: Secondary | ICD-10-CM | POA: Diagnosis not present

## 2024-05-14 DIAGNOSIS — I25119 Atherosclerotic heart disease of native coronary artery with unspecified angina pectoris: Secondary | ICD-10-CM | POA: Diagnosis not present

## 2024-05-14 DIAGNOSIS — I6521 Occlusion and stenosis of right carotid artery: Secondary | ICD-10-CM | POA: Diagnosis not present

## 2024-05-14 DIAGNOSIS — I10 Essential (primary) hypertension: Secondary | ICD-10-CM | POA: Diagnosis not present

## 2024-05-14 NOTE — Patient Instructions (Signed)
 Medication Instructions:  Your physician has recommended you make the following change in your medication:  STOP: Aspirin   *If you need a refill on your cardiac medications before your next appointment, please call your pharmacy*  Lab Work: NONE If you have labs (blood work) drawn today and your tests are completely normal, you will receive your results only by: MyChart Message (if you have MyChart) OR A paper copy in the mail If you have any lab test that is abnormal or we need to change your treatment, we will call you to review the results.  Testing/Procedures: SEPT 2026 - - - Your physician has requested that you have a carotid duplex. This test is an ultrasound of the carotid arteries in your neck. It looks at blood flow through these arteries that supply the brain with blood. Allow one hour for this exam. There are no restrictions or special instructions.   Follow-Up: At [facility], you and your health needs are our priority.  As part of our continuing mission to provide you with exceptional heart care, our providers are all part of one team.  This team includes your primary Cardiologist (physician) and Advanced Practice Providers or APPs (Physician Assistants and Nurse Practitioners) who all work together to provide you with the care you need, when you need it.  Your next appointment:   1 year(s)  Provider:   Darryle ONEIDA Decent, MD

## 2024-05-14 NOTE — Progress Notes (Signed)
 Cardiology Office Note:  .   Date:  05/14/2024  ID:  Shane Shelton Race, DOB [birth year], MRN [id number] PCP: Shane Donald HERO, DO  [facility] Providers Cardiologist:  Shane ONEIDA Decent, MD  History of Present Illness: .    Chief Complaint  Patient presents with   Follow-up         Shane Shelton is a 78 y.o. male with history of carotid artery disease, CAD, HTN, HLD who presents for follow-up.    History of Present Illness   Shane Shelton is a 78 year old male with nonobstructive coronary artery disease and carotid artery disease who presents for follow-up after right carotid endarterectomy.  He underwent a right carotid endarterectomy Feb 2025, and has experienced a successful recovery. He resumed gym activities three days post-surgery and reports significant improvement in blood pressure control, with no significant fluctuations since the procedure. He is currently on Imdur  60 mg daily and losartan  25 mg daily, with stable blood pressure readings at home.  He experienced a brief episode of chest pain described as a 'twinge' while getting into his car to go to the gym, accompanied by numbness and tingling, which subsided quickly. He was able to continue his workout without issues. He exercises five to six days a week without limitations and monitors his heart rate, which rests in the fifties to sixties and can reach up to 130 during intense cardio sessions.  He is on Repatha  and Crestor  10 mg daily for hyperlipidemia, with his most recent LDL at 17, which is at goal. He is also taking aspirin  and Plavix , noting bruising from the aspirin . He has a history of nonobstructive diffuse coronary artery disease managed medically, with no current symptoms of angina or stroke-like symptoms.  He is dissatisfied with his previous surgeon's communication, particularly regarding the management of his carotid artery disease.          Problem List CAD -PCI  RCA 2018 -LHC 2021: 70% LAD (DFR 0.93), 75% dLAD, 50% RCA, 80% PLV 2. Carotid artery disease  -R CEA 09/14/2023 3. HLD -T chol 75, HDL 48, LDL 13, TG 71 4. HTN 5. Pre DM -A1c 6.3    ROS: All other ROS reviewed and negative. Pertinent positives noted in the HPI.     Studies Reviewed: .       Carotid US  10/24/2023 Summary:  Right Carotid: Patent stent without evidence of stenosis.   Left Carotid: Velocities in the left ICA are consistent with a 1-39%  stenosis.   Vertebrals: Bilateral vertebral arteries demonstrate antegrade flow.   *See table(s) above for measurements and observations.  Physical Exam:   VS:  BP 120/62   Pulse 73   Ht 6' 2 (1.88 m)   Wt 209 lb (94.8 kg)   SpO2 94%   BMI 26.83 kg/m    Wt Readings from Last 3 Encounters:  05/14/24 209 lb (94.8 kg)  01/07/24 205 lb (93 kg)  10/24/23 209 lb 1.6 oz (94.8 kg)    GEN: Well nourished, well developed in no acute distress NECK: No JVD; No carotid bruits CARDIAC: RRR, no murmurs, rubs, gallops RESPIRATORY:  Clear to auscultation without rales, wheezing or rhonchi  ABDOMEN: Soft, non-tender, non-distended EXTREMITIES:  No edema; No deformity  ASSESSMENT AND PLAN: .   Assessment and Plan    Carotid artery disease, status post right carotid endarterectomy Status post right carotid endarterectomy with excellent recovery. Blood pressure stabilized, no significant bruits, ultrasound shows no  significant changes. - Discontinue aspirin , continue Plavix . - Order annual carotid ultrasound before next appointment. - Follow up in one year.  Coronary artery disease, nonobstructive, managed medically Nonobstructive coronary artery disease managed medically. Single episode of chest twinge, resolved quickly, no current angina symptoms. Regular exercise without limitations. - plavix  and lipid lowering meds. No angina  Hypertension Hypertension well-controlled on current medication regimen. Blood pressure stable and within  normal range.  Hyperlipidemia Hyperlipidemia well-managed with Repatha  and Crestor . LDL level at goal.              Follow-up: Return in about 1 year (around 05/14/2025).  Signed, Shane DASEN. Barbaraann, MD, [facility]  [facility]  [address] [place], [place] [address] [phone]  3:28 PM

## 2024-06-09 ENCOUNTER — Ambulatory Visit: Admitting: Cardiovascular Disease

## 2024-06-25 ENCOUNTER — Other Ambulatory Visit: Payer: Self-pay | Admitting: Cardiovascular Disease

## 2024-06-28 ENCOUNTER — Encounter: Payer: Self-pay | Admitting: Cardiovascular Disease

## 2024-06-30 MED ORDER — LOSARTAN POTASSIUM 25 MG PO TABS: 25.0000 mg | ORAL_TABLET | Freq: Every day | ORAL | 3 refills | Status: AC

## 2024-06-30 MED ORDER — ROSUVASTATIN CALCIUM 10 MG PO TABS: 10.0000 mg | ORAL_TABLET | Freq: Every day | ORAL | 3 refills | Status: AC

## 2024-07-29 ENCOUNTER — Other Ambulatory Visit: Payer: Self-pay | Admitting: Family Medicine

## 2024-07-29 MED ORDER — ACETAZOLAMIDE 125 MG PO TABS: 125.0000 mg | ORAL_TABLET | Freq: Two times a day (BID) | ORAL | 0 refills | Status: AC

## 2024-08-20 ENCOUNTER — Encounter: Payer: Self-pay | Admitting: Family Medicine

## 2024-08-20 DIAGNOSIS — Z85828 Personal history of other malignant neoplasm of skin: Secondary | ICD-10-CM

## 2024-08-21 NOTE — Telephone Encounter (Signed)
 Referral sent

## 2024-10-17 ENCOUNTER — Encounter: Admitting: Family Medicine

## 2025-01-08 ENCOUNTER — Encounter

## 2025-04-13 ENCOUNTER — Ambulatory Visit (HOSPITAL_COMMUNITY)
